# Patient Record
Sex: Male | Born: 1935
Health system: Southern US, Community
[De-identification: ages and names within clinical notes are randomized; demographics above are authoritative.]

## PROBLEM LIST (undated history)

## (undated) DIAGNOSIS — N400 Enlarged prostate without lower urinary tract symptoms: Secondary | ICD-10-CM

## (undated) DIAGNOSIS — E785 Hyperlipidemia, unspecified: Secondary | ICD-10-CM

## (undated) DIAGNOSIS — Z8679 Personal history of other diseases of the circulatory system: Secondary | ICD-10-CM

## (undated) DIAGNOSIS — J329 Chronic sinusitis, unspecified: Secondary | ICD-10-CM

## (undated) DIAGNOSIS — K219 Gastro-esophageal reflux disease without esophagitis: Secondary | ICD-10-CM

## (undated) DIAGNOSIS — K222 Esophageal obstruction: Secondary | ICD-10-CM

## (undated) DIAGNOSIS — Z950 Presence of cardiac pacemaker: Secondary | ICD-10-CM

## (undated) DIAGNOSIS — R55 Syncope and collapse: Secondary | ICD-10-CM

## (undated) DIAGNOSIS — I4430 Unspecified atrioventricular block: Secondary | ICD-10-CM

## (undated) HISTORY — DX: Unspecified atrioventricular block: I44.30

## (undated) HISTORY — DX: Esophageal obstruction: K22.2

## (undated) HISTORY — PX: PACEMAKER INSERTION: SHX728

## (undated) HISTORY — DX: Hyperlipidemia, unspecified: E78.5

## (undated) HISTORY — PX: CHOLECYSTECTOMY: SHX55

## (undated) HISTORY — DX: Chronic sinusitis, unspecified: J32.9

## (undated) HISTORY — DX: Syncope and collapse: R55

## (undated) HISTORY — DX: Personal history of other diseases of the circulatory system: Z86.79

## (undated) HISTORY — DX: Gastro-esophageal reflux disease without esophagitis: K21.9

## (undated) HISTORY — DX: Benign prostatic hyperplasia without lower urinary tract symptoms: N40.0

---

## 1998-07-09 ENCOUNTER — Encounter: Payer: Self-pay | Admitting: Gastroenterology

## 1998-07-09 ENCOUNTER — Ambulatory Visit (HOSPITAL_COMMUNITY): Admission: RE | Admit: 1998-07-09 | Discharge: 1998-07-09 | Payer: Self-pay | Admitting: Gastroenterology

## 1998-10-17 ENCOUNTER — Encounter: Payer: Self-pay | Admitting: Internal Medicine

## 1998-10-17 ENCOUNTER — Ambulatory Visit: Admission: RE | Admit: 1998-10-17 | Discharge: 1998-10-17 | Payer: Self-pay | Admitting: Internal Medicine

## 1998-11-25 ENCOUNTER — Encounter: Payer: Self-pay | Admitting: Otolaryngology

## 1998-11-25 ENCOUNTER — Ambulatory Visit (HOSPITAL_COMMUNITY): Admission: RE | Admit: 1998-11-25 | Discharge: 1998-11-25 | Payer: Self-pay | Admitting: Otolaryngology

## 1998-11-30 ENCOUNTER — Encounter: Payer: Self-pay | Admitting: Otolaryngology

## 1998-12-01 ENCOUNTER — Inpatient Hospital Stay (HOSPITAL_COMMUNITY): Admission: RE | Admit: 1998-12-01 | Discharge: 1998-12-04 | Payer: Self-pay | Admitting: Neurosurgery

## 1998-12-02 ENCOUNTER — Encounter: Payer: Self-pay | Admitting: Neurosurgery

## 1998-12-03 ENCOUNTER — Encounter: Payer: Self-pay | Admitting: Neurosurgery

## 1999-06-16 ENCOUNTER — Encounter: Admission: RE | Admit: 1999-06-16 | Discharge: 1999-06-16 | Payer: Self-pay | Admitting: Neurosurgery

## 1999-06-16 ENCOUNTER — Encounter: Payer: Self-pay | Admitting: Neurosurgery

## 2000-06-23 ENCOUNTER — Encounter: Payer: Self-pay | Admitting: Neurosurgery

## 2000-06-23 ENCOUNTER — Ambulatory Visit (HOSPITAL_COMMUNITY): Admission: RE | Admit: 2000-06-23 | Discharge: 2000-06-23 | Payer: Self-pay | Admitting: Neurosurgery

## 2001-07-06 ENCOUNTER — Encounter: Admission: RE | Admit: 2001-07-06 | Discharge: 2001-07-06 | Payer: Self-pay | Admitting: Neurosurgery

## 2001-07-06 ENCOUNTER — Encounter: Payer: Self-pay | Admitting: Neurosurgery

## 2002-07-01 ENCOUNTER — Encounter: Admission: RE | Admit: 2002-07-01 | Discharge: 2002-07-01 | Payer: Self-pay | Admitting: Internal Medicine

## 2002-07-01 ENCOUNTER — Encounter (HOSPITAL_BASED_OUTPATIENT_CLINIC_OR_DEPARTMENT_OTHER): Payer: Self-pay | Admitting: Internal Medicine

## 2002-10-21 ENCOUNTER — Encounter: Admission: RE | Admit: 2002-10-21 | Discharge: 2002-10-21 | Payer: Self-pay | Admitting: Neurosurgery

## 2002-10-21 ENCOUNTER — Encounter: Payer: Self-pay | Admitting: Neurosurgery

## 2003-06-24 HISTORY — PX: CARDIAC CATHETERIZATION: SHX172

## 2003-07-04 ENCOUNTER — Emergency Department (HOSPITAL_COMMUNITY): Admission: EM | Admit: 2003-07-04 | Discharge: 2003-07-05 | Payer: Self-pay | Admitting: Emergency Medicine

## 2003-07-15 ENCOUNTER — Ambulatory Visit (HOSPITAL_COMMUNITY): Admission: RE | Admit: 2003-07-15 | Discharge: 2003-07-15 | Payer: Self-pay | Admitting: Cardiology

## 2003-10-30 ENCOUNTER — Encounter: Admission: RE | Admit: 2003-10-30 | Discharge: 2003-10-30 | Payer: Self-pay | Admitting: Neurosurgery

## 2003-12-16 ENCOUNTER — Ambulatory Visit (HOSPITAL_COMMUNITY): Admission: RE | Admit: 2003-12-16 | Discharge: 2003-12-16 | Payer: Self-pay | Admitting: Gastroenterology

## 2003-12-23 ENCOUNTER — Ambulatory Visit (HOSPITAL_COMMUNITY): Admission: RE | Admit: 2003-12-23 | Discharge: 2003-12-23 | Payer: Self-pay | Admitting: Cardiology

## 2004-10-21 ENCOUNTER — Inpatient Hospital Stay (HOSPITAL_COMMUNITY): Admission: RE | Admit: 2004-10-21 | Discharge: 2004-10-23 | Payer: Self-pay | Admitting: Urology

## 2004-10-21 ENCOUNTER — Encounter (INDEPENDENT_AMBULATORY_CARE_PROVIDER_SITE_OTHER): Payer: Self-pay | Admitting: *Deleted

## 2005-01-13 ENCOUNTER — Encounter: Admission: RE | Admit: 2005-01-13 | Discharge: 2005-01-13 | Payer: Self-pay | Admitting: Neurosurgery

## 2005-03-05 ENCOUNTER — Emergency Department (HOSPITAL_COMMUNITY): Admission: EM | Admit: 2005-03-05 | Discharge: 2005-03-06 | Payer: Self-pay | Admitting: Emergency Medicine

## 2005-07-12 ENCOUNTER — Emergency Department (HOSPITAL_COMMUNITY): Admission: EM | Admit: 2005-07-12 | Discharge: 2005-07-12 | Payer: Self-pay | Admitting: Family Medicine

## 2006-01-12 ENCOUNTER — Encounter: Admission: RE | Admit: 2006-01-12 | Discharge: 2006-01-12 | Payer: Self-pay | Admitting: Neurosurgery

## 2007-01-10 ENCOUNTER — Encounter: Admission: RE | Admit: 2007-01-10 | Discharge: 2007-01-10 | Payer: Self-pay | Admitting: Neurosurgery

## 2007-11-07 ENCOUNTER — Inpatient Hospital Stay (HOSPITAL_COMMUNITY): Admission: EM | Admit: 2007-11-07 | Discharge: 2007-11-09 | Payer: Self-pay | Admitting: Emergency Medicine

## 2007-11-08 ENCOUNTER — Encounter (INDEPENDENT_AMBULATORY_CARE_PROVIDER_SITE_OTHER): Payer: Self-pay | Admitting: General Surgery

## 2009-08-26 DIAGNOSIS — R195 Other fecal abnormalities: Secondary | ICD-10-CM | POA: Insufficient documentation

## 2009-08-26 DIAGNOSIS — R972 Elevated prostate specific antigen [PSA]: Secondary | ICD-10-CM | POA: Insufficient documentation

## 2009-10-28 ENCOUNTER — Encounter: Admission: RE | Admit: 2009-10-28 | Discharge: 2009-10-28 | Payer: Self-pay | Admitting: Neurosurgery

## 2009-12-28 ENCOUNTER — Encounter: Admission: RE | Admit: 2009-12-28 | Discharge: 2009-12-28 | Payer: Self-pay | Admitting: Internal Medicine

## 2010-01-15 ENCOUNTER — Ambulatory Visit: Payer: Self-pay | Admitting: Cardiology

## 2010-03-27 ENCOUNTER — Encounter: Payer: Self-pay | Admitting: Internal Medicine

## 2010-03-31 ENCOUNTER — Telehealth (INDEPENDENT_AMBULATORY_CARE_PROVIDER_SITE_OTHER): Payer: Self-pay | Admitting: *Deleted

## 2010-04-12 ENCOUNTER — Ambulatory Visit: Payer: Self-pay

## 2010-04-12 ENCOUNTER — Encounter: Payer: Self-pay | Admitting: Internal Medicine

## 2010-04-29 ENCOUNTER — Ambulatory Visit: Payer: Self-pay | Admitting: Cardiology

## 2010-06-13 ENCOUNTER — Encounter: Payer: Self-pay | Admitting: Neurosurgery

## 2010-06-24 NOTE — Cardiovascular Report (Signed)
Summary: Office Visit   Office Visit   Imported By: Roderic Ovens 04/27/2010 11:00:13  _____________________________________________________________________  External Attachment:    Type:   Image     Comment:   External Document

## 2010-06-24 NOTE — Progress Notes (Signed)
  Phone Note Call from Patient   Summary of Call: pt requested Dr Johney Frame follow device. Vella Kohler  March 31, 2010 5:31 PM

## 2010-06-24 NOTE — Procedures (Signed)
Summary: pacer check/medtronic   Current Medications (verified): 1)  Protonix 40 Mg Tbec (Pantoprazole Sodium) .... Take 1 Capsule By Mouth Once Daily 2)  Simvastatin 40 Mg Tabs (Simvastatin) .... Take 1 Tablet By Mouth Once Daily 3)  Flunisolide 0.025 % Soln (Flunisolide) .... One or Two Sprays Daily 4)  L-Lysine 500 Mg Tabs (Lysine) .... Take 1 Tablet By Mouth Once Daily 5)  Multivitamins  Tabs (Multiple Vitamin) .... Take 1 Tablet By Mouth Once Daily  Allergies (verified): 1)  ! Aspirin 2)  ! Erythromycin  PPM Specifications Following MD:  Hillis Range, MD     Referring MD:  Rolla Plate PPM Vendor:  Medtronic     PPM Model Number:  [id number]     PPM Serial Number:  [id number] H PPM DOI:  12/23/2003     PPM Implanting MD:  Rolla Plate  Lead 1    Location: RA     DOI: 12/02/1998     Model #: [id number]     Serial #: [id number]     Status: active Lead 2    Location: RV     DOI: 12/02/1998     Model #: [id number]     Serial #: [id number]     Status: active  Magnet Response Rate:  BOL 85 ERI 65  Indications:  Sick sinus syndrome   PPM Follow Up Battery Voltage:  2.73 V     Battery Est. Longevity:  2.5 yrs     Pacer Dependent:  Yes       PPM Device Measurements Atrium  Amplitude: 5.60 mV, Impedance: 430 ohms, Threshold: 0.750 V at 0.40 msec Right Ventricle  Amplitude: PACED mV, Impedance: 475 ohms, Threshold: 0.750 V at 0.40 msec  Episodes MS Episodes:  0     Ventricular High Rate:  0     Atrial Pacing:  11.8%     Ventricular Pacing:  99.9%  Parameters Mode:  DDDR     Lower Rate Limit:  40     Upper Rate Limit:  130 Paced AV Delay:  200     Sensed AV Delay:  180 Next Cardiology Appt Due:  06/23/2010 Tech Comments:  [place] [facility] PT---NORMAL DEVICE FUNCTION.  NO EPISODES SINCE LAST CHECK.  NO CHANGES MADE. PT ENROLLED IN [place].  ROV IN 3 [facility] W/JA AND WILL CONTINUE Javier Glazier AFTER APPT W/JA. Vella Kohler  April 12, 2010 2:33 PM

## 2010-06-24 NOTE — Miscellaneous (Signed)
Summary: Device preload  Clinical Lists Changes  Observations: Added new observation of PPM INDICATN: Sick sinus syndrome (03/27/2010 13:35) Added new observation of MAGNET RTE: BOL 85 ERI 65 (03/27/2010 13:35) Added new observation of PPMLEADSTAT2: active (03/27/2010 13:35) Added new observation of PPMLEADSER2: 20312  (03/27/2010 13:35) Added new observation of PPMLEADMOD2: 4461  (03/27/2010 13:35) Added new observation of PPMLEADDOI2: 12/02/1998  (03/27/2010 13:35) Added new observation of PPMLEADLOC2: RV  (03/27/2010 13:35) Added new observation of PPMLEADSTAT1: active  (03/27/2010 13:35) Added new observation of PPMLEADSER1: 742595  (03/27/2010 13:35) Added new observation of PPMLEADMOD1: 4460  (03/27/2010 13:35) Added new observation of PPMLEADDOI1: 12/02/1998  (03/27/2010 13:35) Added new observation of PPMLEADLOC1: RA  (03/27/2010 13:35) Added new observation of PPM DOI: 12/23/2003  (03/27/2010 13:35) Added new observation of PPM SERL#: [id number]  (03/27/2010 13:35) Added new observation of PPM MODL#: [id number]  (03/27/2010 41:66) Added new observation of PACEMAKERMFG: Medtronic  (03/27/2010 13:35) Added new observation of PPM IMP MD: Duffy Rhody Tennant,MD  (03/27/2010 13:35) Added new observation of PPM REFER MD: Rolla Plate  (03/27/2010 13:35) Added new observation of PACEMAKER MD: Sherryl Manges, MD  (03/27/2010 13:35)      PPM Specifications Following MD:  Sherryl Manges, MD     Referring MD:  Rolla Plate PPM Vendor:  Medtronic     PPM Model Number:  [id number]     PPM Serial Number:  [id number] H PPM DOI:  12/23/2003     PPM Implanting MD:  Rolla Plate  Lead 1    Location: RA     DOI: 12/02/1998     Model #: [id number]     Serial #: [id number]     Status: active Lead 2    Location: RV     DOI: 12/02/1998     Model #: [id number]     Serial #: [id number]     Status: active  Magnet Response Rate:  BOL 85 ERI 65  Indications:  Sick sinus syndrome

## 2010-07-14 DIAGNOSIS — E785 Hyperlipidemia, unspecified: Secondary | ICD-10-CM | POA: Insufficient documentation

## 2010-07-14 DIAGNOSIS — I442 Atrioventricular block, complete: Secondary | ICD-10-CM | POA: Insufficient documentation

## 2010-07-14 DIAGNOSIS — K219 Gastro-esophageal reflux disease without esophagitis: Secondary | ICD-10-CM | POA: Insufficient documentation

## 2010-07-14 DIAGNOSIS — Z950 Presence of cardiac pacemaker: Secondary | ICD-10-CM | POA: Insufficient documentation

## 2010-07-15 ENCOUNTER — Encounter: Payer: Self-pay | Admitting: Internal Medicine

## 2010-07-15 ENCOUNTER — Encounter (INDEPENDENT_AMBULATORY_CARE_PROVIDER_SITE_OTHER): Payer: Medicare Other | Admitting: Internal Medicine

## 2010-07-15 DIAGNOSIS — I10 Essential (primary) hypertension: Secondary | ICD-10-CM | POA: Insufficient documentation

## 2010-07-15 DIAGNOSIS — I442 Atrioventricular block, complete: Secondary | ICD-10-CM

## 2010-07-20 NOTE — Assessment & Plan Note (Signed)
Summary: device/saf/RS from/PC2/pmo   Visit Type:  PPM-Medtronic Referring Provider:  Dr Patty Sermons Primary Provider:  Dr Brunilda Payor  CC:  no complaints.  History of Present Illness: Charles Olson is a pleaant 77 yo male with a h/o complete heart block s/p PPM who presents to establish care in the EP clinic.  He reports having syncope in the setting of CHB and underwent initial pacemaker implantation 12/02/1998.  Most recently, he underwent PPM pulse generator replacement by Dr Reyes Ivan 12/23/03.  He has done very well since that time.  He remains very active.  He denies symptoms of palpitations, chest pain, shortness of breath, orthopnea, PND, lower extremity edema, dizziness, presyncope, syncope, or neurologic sequela. The patient is tolerating medications without difficulties and is otherwise without complaint today.   Current Medications (verified): 1)  Protonix 40 Mg Tbec (Pantoprazole Sodium) .... Take 1 Capsule By Mouth Once Daily 2)  Simvastatin 40 Mg Tabs (Simvastatin) .... Take 1 Tablet By Mouth Once Daily 3)  Flunisolide 0.025 % Soln (Flunisolide) .... One or Two Sprays Daily 4)  L-Lysine 500 Mg Tabs (Lysine) .... Take 1 Tablet By Mouth Once Daily 5)  Multivitamins  Tabs (Multiple Vitamin) .... Take 1 Tablet By Mouth Once Daily  Allergies (verified): 1)  ! Aspirin 2)  ! Erythromycin  Past History:  Past Medical History: Complete Heart block s/p PPM 2000 with generator replacement 2005 by Dr Lujean Amel (ICD-272.4) GERD (ICD-530.81) Hiatal hernia  Past Surgical History: s/p PPM 2000, with generator change 2005 by Dr Reyes Ivan s/p APPY s/p chole tonsellectomy hearnia repair x 2 nasal polyps removed  Family History: Reviewed history from 07/14/2010 and no changes required.  Mother died of heart disease at 33.  Father had colon   cancer.  He has had a twin brother who died of a heart attack.  Social History:  He is married, has three sons, has been active in   Holiday [employer].  He is retired and lives in [place].  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      Review of Systems       All systems are reviewed and negative except as listed in the HPI.   Vital Signs:  Patient profile:   77 year old male Height:      70 inches Weight:      157.50 pounds BMI:     22.68 Pulse rate:   59 / minute BP sitting:   155 / 69  (left arm) Cuff size:   regular  Vitals Entered By: Caralee Ates CMA (July 15, 2010 9:54 AM)  Physical Exam  General:  Well developed, well nourished, in no acute distress. Head:  normocephalic and atraumatic Eyes:  PERRLA/EOM intact; conjunctiva and lids normal. Mouth:  Teeth, gums and palate normal. Oral mucosa normal. Neck:  supple Chest Wall:  pacemaker pocket is well healed Lungs:  Clear bilaterally to auscultation and percussion. Heart:  RRR, no m/r/g Abdomen:  Bowel sounds positive; abdomen soft and non-tender without masses, organomegaly, or hernias noted. No hepatosplenomegaly. Msk:  Back normal, normal gait. Muscle strength and tone normal. Extremities:  No clubbing or cyanosis. Neurologic:  Alert and oriented x 3. Skin:  Intact without lesions or rashes.   PPM Specifications Following MD:  Charles Range, MD     Referring MD:  Rolla Plate PPM Vendor:  Medtronic     PPM Model Number:  [id number]  PPM Serial Number:  [id number] H PPM DOI:  12/23/2003     PPM Implanting MD:  Duffy Rhody Tennant,MD  Lead 1    Location: RA     DOI: 12/02/1998     Model #: [id number]     Serial #: [id number]     Status: active Lead 2    Location: RV     DOI: 12/02/1998     Model #: [id number]     Serial #: [id number]     Status: active  Magnet Response Rate:  BOL 85 ERI 65  Indications:  Sick sinus syndrome   PPM Follow Up Pacer Dependent:  Yes      Parameters Mode:  DDDR     Lower Rate Limit:  40     Upper Rate Limit:  130 Paced AV Delay:  200     Sensed AV Delay:  180 MD  Comments:  see scanned report  Impression & Recommendations:  Problem # 1:  ATRIOVENTRICULAR BLOCK (ICD-426.10) s/p PPM for complete heart block normal pacemaker function see scanned report  Problem # 2:  HYPERLIPIDEMIA (ICD-272.4) stable no changes His updated medication list for this problem includes:    Simvastatin 40 Mg Tabs (Simvastatin) .Marland Kitchen... Take 1 tablet by mouth once daily  Problem # 3:  ESSENTIAL HYPERTENSION, BENIGN (ICD-401.1) above goal today salt restriction advised  Patient Instructions: 1)  Your physician recommends that you schedule a follow-up appointment in: 1YEAR 2)  Your physician recommends that you continue on your current medications as directed. Please refer to the Current Medication list given to you today. 3)  Your physician has requested that you limit the intake of sodium (salt) in your diet to four grams daily. Please see [facility] handout.

## 2010-08-03 NOTE — Cardiovascular Report (Signed)
Summary: Office Visit   Office Visit   Imported By: Roderic Ovens 07/28/2010 12:40:15  _____________________________________________________________________  External Attachment:    Type:   Image     Comment:   External Document

## 2010-10-05 NOTE — Op Note (Signed)
NAMETYRECE, VANTERPOOL                ACCOUNT NO.:  [id number]   MEDICAL RECORD NO.:  [id number]          PATIENT TYPE:  INP   LOCATION:  4742                         FACILITY:  [facility]   PHYSICIAN:  Sharlet Salina T. Hoxworth, M.D.DATE OF BIRTH:  02-29-1930   DATE OF PROCEDURE:  11/08/2007  DATE OF DISCHARGE:                               OPERATIVE REPORT   PREOPERATIVE DIAGNOSIS:  Acute cholecystitis and cholelithiasis.   POSTOPERATIVE DIAGNOSIS:  Acute cholecystitis and cholelithiasis.   SURGICAL PROCEDURES:  Laparoscopic cholecystectomy with intraoperative  cholangiogram.   SURGEON:  Sharlet Salina T. Hoxworth, MD   ANESTHESIA:  General.   BRIEF HISTORY:  Shante Maysonet is a 77 year old male who presents with  acute onset of epigastric abdominal pain, nausea, vomiting.  Workup has  included a CT scan showing a large gallstone impacted the neck of the  gallbladder and HIDA scan was positive for nonfilling of the  gallbladder.  I have recommended proceeding with laparoscopic  cholecystectomy with cholangiogram for apparent acute cholecystitis with  cholelithiasis.  Ashby Dawes of the procedure, indications, risks of  bleeding, infection, bile leak, bile duct injury and anesthetic risks  were discussed with the patient and the family preoperatively.  He is  now brought to operating room for this procedure.   DESCRIPTION OF OPERATION:  The patient was brought to the operating room  and placed in the supine position on the operating table, and general  orotracheal anesthesia was induced.  The abdomen was widely sterilely  prepped and draped.  Correct patient and procedure were verified.  He is  already on broad-spectrum antibiotics.  Local anesthesia was used to  infiltrate the trocar sites.  Access was obtained with a 1-cm incision  at the umbilicus using a Hasson trocar and pneumoperitoneum was  established.  There were a few chronic omental adhesions in the right  upper quadrant.  They were taken  down for exposure.  The gallbladder was  exposed and was acutely inflamed with patchy areas of gangrene and was  tensely distended.  It was decompressed with an aspiration needle and  the fundus was grasped and elevated above the liver.  The infundibulum  retracted inferolaterally.  The dissection of the distal gallbladder and  closed triangle was relatively easy with edematous changes.  Fibrofatty  tissue was stripped off the neck of the gallbladder and closed triangle  was thoroughly dissected.  The cystic duct gallbladder junction was  dissected 306 degrees then the cystic duct was dissected out over a  centimeter or so.  When the anatomy appeared clear, the cystic duct was  clipped at the gallbladder junction and an operative cholangiogram was  obtained through the cystic duct.  This showed good filling of normal  common bile duct and intrahepatic ducts with free flow into the duodenum  and no filling defects.  Following this, the cystic artery was triply  clipped proximally after removal of the cholangiocath and divided.  The  anterior and posterior branches of the cystic artery were clearly seen  coursing up out of the gallbladder wall, were divided between clips.  The gallbladder was  then dissected free from its bed using hook cautery  and removed through an EndoCatch bag.  Complete hemostasis was obtained  in the gallbladder bed  and the right upper quadrant was thoroughly irrigated and suctioned  until clear.  Trocars were then removed under direct vision, all CO2  evacuated.  The mattress sutures were secured to the umbilicus.  Skin  incisions were closed with subcuticular Monocryl and Dermabond.  The  patient was taken to recovery in good condition.      Lorne Skeens. Hoxworth, M.D.  Electronically Signed     BTH/MEDQ  D:  11/08/2007  T:  11/09/2007  Job:  [id number]

## 2010-10-05 NOTE — Consult Note (Signed)
Charles Olson, Charles Olson                ACCOUNT NO.:  [id number]   MEDICAL RECORD NO.:  [id number]          PATIENT TYPE:  INP   LOCATION:  4742                         FACILITY:  [facility]   PHYSICIAN:  Sharlet Salina T. Hoxworth, M.D.DATE OF BIRTH:  [birth year]   DATE OF CONSULTATION:  11/07/2007  DATE OF DISCHARGE:                                 CONSULTATION   CHIEF COMPLAINT:  Abdominal pain.   HISTORY OF PRESENT ILLNESS:  We were asked by Dr. Randa Evens to evaluate  Mr. Rijos.  He is a very nice 77 year old male who is followed somewhat  regularly by Dr. Sherin Quarry for reflux and history of esophageal  stricture.  He presented to the emergency room last night.  He had the  onset of epigastric pain in the afternoon yesterday.  The onset was  quite sudden with severe pressure-like pain.  There was no radiation.  He developed nausea and vomiting rapidly after this.  He then presented  to the emergency room for evaluation.  The pain has continued since his  admission relieved only by the pain and nausea medications.  He denies  any history of similar episodes.  He does have some occasional heartburn  and this was managed well by medications.  No fever, chills, or  jaundice.  No urinary symptoms.   PAST MEDICAL HISTORY:  Previous surgery includes bilateral inguinal  hernia repair, appendectomy, tonsillectomy, TURP, nasal polyps.  Medically, he is followed for GERD occurred and hiatal hernia with  history of esophageal stricture dilatations.  Also, he has had AV block  and had a pacemaker inserted and followed by Dr. Patty Sermons.  Also, he  has elevated cholesterol.   MEDICATIONS ON ADMISSION:  1. Protonix 40 mg daily.  2. Simvastatin 40 daily.  3. Lamisil 2.5% drops.  4. L-lysine 500 mg daily.  5. Flunisolide nasal spray daily.   ALLERGIES:  ERYTHROMYCIN BASE and ASPIRIN.   SOCIAL HISTORY:  He is married and retired.  Does not smoke cigarettes  or drink alcohol.   FAMILY HISTORY:   Noncontributory.   REVIEW OF SYSTEMS:  GENERAL:  No fever, chills, and weight change.  HEENT:  No vision, hearing, and swallowing problems.  RESPIRATORY:  No  shortness breath, cough, and wheezing.  CARDIAC:  No chest pain,  palpitations, and syncope.  ABDOMEN:  GI as above.  GU:  Some occasional  urinary hesitancy.   PHYSICAL EXAMINATION:  VITAL SIGNS:  He is afebrile, heart rate 78,  respirations 20, blood pressure 142/78, and O2 sats 98% on 2 L.  GENERAL:  He is well-developed, alert, no acute distress, after pain  medications.  SKIN:  Warm and dry.  No rash infection.  HEENT:  No palpable masses.  No scleral icterus.  LYMPH NODES:  There is no cervical, supraclavicular, or inguinal nodes  palpable.  LUNGS:  Clear without wheezing or increased work of breathing.  CARDIAC:  Regular rate and rhythm.  No murmurs.  No edema.  ABDOMEN:  There is localized epigastric tenderness and guarding without  palpable masses or hepatosplenomegaly.  Remainder the abdomen is soft,  nontender,  and nondistended.  No hernias.  EXTREMITIES:  No joint swelling deformity.  NEUROLOGIC:  Alert and oriented.  Motor and sensory exam is grossly  normal.   LABORATORY DATA:  Amylase 121.  Electrolytes normal.  Glucose 149.  LFTs  all within normal limits.  CBC shows a white count of 11,100, hemoglobin  14, and platelets 153,000.  Fecal occult blood negative.  Lactic acid  normal at 1.0.  Urinalysis unremarkable.   Imaging, acute abdomen showed a stable tiny opacity in the left lung  without acute findings.  CT scan of the abdomen and pelvis has revealed  a large gallstone in the gallbladder neck with mild prominence of the  gallbladder, but no evidence of acute cholecystitis or ductal  dilatation.  There is a nonobstructive right renal calculus as well.   Hepatobiliary scan was performed today which shows obstruction of the  cystic duct consistent with acute cholecystitis.   ASSESSMENT/PLAN:  Acute  abdominal pain and tenderness consistent with  acute cholecystitis and the imaging demonstrates a large gallstone  obstruction of the cystic duct.  He appears to have early acute  cholecystitis.  I have recommended proceeding with laparoscopic  cholecystectomy with intraoperative cholangiogram.  The procedures,  indications, and risks were discussed with the patient and family and we  will schedule this for early tomorrow morning.      Lorne Skeens. Hoxworth, M.D.  Electronically Signed     BTH/MEDQ  D:  11/07/2007  T:  11/08/2007  Job:  [id number]

## 2010-10-05 NOTE — H&P (Signed)
Charles Olson, Charles Olson                ACCOUNT NO.:  [id number]   MEDICAL RECORD NO.:  [id number]          PATIENT TYPE:  INP   LOCATION:  4742                         FACILITY:  [facility]   PHYSICIAN:  Charles Olson., M.D.DATE OF BIRTH:  [birth year]   DATE OF ADMISSION:  11/06/2007  DATE OF DISCHARGE:                              HISTORY & PHYSICAL   REASON FOR ADMISSION:  Severe upper abdominal pain.   HISTORY:  Nice patient of Dr. Tasia Olson who has obtained his care  through the [facility], is now up again and see Dr. Dossie Olson for primary  care.  He presented to the emergency room with severe abdominal pain.  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  Upon presenting to triage, there was concern that he may well be having  a ruptured aneurysm.  He was rushed to the CT scanner where a CT scan  was performed.  It revealed a large gallstone with a distended  gallbladder, but was completely normal otherwise.  The patient's EKG was  normal in a paced rhythm.  His labs were remarkable for normal white  count and hemoglobin, normal LFTs, and normal lipase.  His urinalysis  was normal as well.  He received some Phenergan and Charles Olson and  apparently felt some relief with that.  This is the medicine which worn  off.  His pain is gradually returning.  The patient has never had  similar pain.  This was amongst the worst pain he has ever had.  He had  some nausea and vomiting with this initially at home, none since he has  come to the emergency room.   MEDICATIONS ON ADMISSION:  1. Zocor 40 mg daily.  2. Protonix 40 mg daily.  3. Flunisolide 0.25% spray in the nose daily.   He is  allergic to ASPIRIN and ERYTHROMYCIN.   MEDICAL HISTORY:  History of elevated cholesterol.  He has had nasal  polyps for which he has nasal sprays which he has been obtaining from  the [place].  He has had a history of kidney stones.  He has esophageal  reflux with strictures and had dilations 3-4 times by Dr. Sherin Olson, also  his routine colonoscopies due to family history of colon cancer.  All  these have been up today.  He has an AV block for which he sees Dr. Ronny Olson and has had a pacemaker in one of a malfunction, 3 called and  he said a second one place.  He has had a Persantine type stress test in  the past that has been normal.   Surgeries include appendectomy, TURP, pacemaker insertion,  tonsillectomy, and inguinal hernia repair.   FAMILY HISTORY:  Mother died of heart disease at 81.  Father  had colon  cancer.  He has had a twin brother who died of a heart attack.   SOCIAL HISTORY:  He is married, has three sons, has been active in  [employer].  He is retired.  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   REVIEW OF SYSTEMS:  No problems swallowing since his last dilation.  He  has no dyspepsia, all on Protonix which he takes regularly, never has  had a gallbladder attack in the past.  His bowels are normal.  He has  never had exertional chest pain.  He has had normal studies in the past.  He sees Dr. Patty Olson every 3-4 months and has his pacemaker checked.  He saw him a week or two ago.  His pacemaker was functioning fine.   PHYSICAL EXAMINATION:  VITAL SIGNS:  Temperature 97.8, pulse 66, and  blood pressure 147/67.  GENERAL:  Alert and oriented white male who is complaining of some  epigastric pain.  He is in no distress.  EYES:  Sclerae nonicteric.  HEART:  Regular rate and rhythm without murmurs or gallops.  LUNGS:  Clear.  ABDOMEN:  Soft and nondistended with localized epigastric tenderness.   EXTREMITIES:  Good pulses.  No peripheral edema.   ASSESSMENT:  Marked epigastric pain - most likely etiology in this  gentleman is gallstones.  This is the only finding on CT.  His labs are  otherwise normal.  EKG is normal.   PLAN:  We will go ahead and admit for pain control, obtain hepatobiliary  scan.  If this is negative, he may need an endoscopy.  As a precaution,  we will place him on telemetry.           ______________________________  Charles Olson., M.D.     Charles Olson  D:  11/07/2007  T:  11/07/2007  Job:  [id number]   cc:   Charles Olson, M.D.  Charles Olson, M.D.  Charles Olson, M.D.

## 2010-10-08 NOTE — Op Note (Signed)
[name], Charles Olson                ACCOUNT NO.:  [id number]   MEDICAL RECORD NO.:  [id number]          PATIENT TYPE:  AMB   LOCATION:  DAY                          FACILITY:  [facility]   PHYSICIAN:  Excell Seltzer. Annabell Howells, M.D.    DATE OF BIRTH:  [birth year]   DATE OF PROCEDURE:  10/21/2004  DATE OF DISCHARGE:                                 OPERATIVE REPORT   PREOPERATIVE DIAGNOSIS:  Benign prostatic hypertrophy.   POSTOPERATIVE DIAGNOSIS:  Benign prostatic hypertrophy.   PROCEDURE:  CystoTURP.   ATTENDING SURGEON:  Bjorn Pippin, MD   RESIDENT SURGEON:  Rhae Lerner, MD   ANESTHESIA:  General endotracheal anesthesia.   COMPLICATIONS:  None.   INDICATIONS FOR PROCEDURE:  Mr. Ciullo is a 77 year old gentleman with a past  medical history of significant lower urinary tract symptoms including  dysuria, frequency, and nocturia who underwent evaluation for hematuria.  On  evaluation, the patient was noted to have several small bladder stones as  well as a large median lobe and coapting lateral prostate lobes.  After  discussing his condition with the patient, he has elected to proceed with  cystoscopy and removal of the bladder stones as well as TURP.  Prior to the  procedure today, Mr. Pinney was able to pass his bladder stones, and therefore  we will plan to inspect the bladder for any remaining stones and remove them  prior to performing the TURP.   PROCEDURE IN DETAIL:  The patient was brought to the operating room.  Following induction of general endotracheal anesthesia, was placed in the  dorsal lithotomy position and prepped and draped in the usual sterile  fashion.  A rigid cystoscope was subsequently advanced through the patient's  urethra and into the bladder.  Upon entering the bladder, both right and  left ureteral orifices were identified and efflux of clear urine confirmed  from each.  Systematic examination of the bladder subsequently revealed mild  trabeculation throughout;  however, there no signs of tumor, stone, trauma,  or other abnormality present.  The cystoscope was subsequent removed, and a  28-French resectoscope was advanced through the patient's urethra and into  the bladder.  The resectoscope was then withdrawn into the prostatic urethra  and transurethral resection of the prostate initiated using 24 resecting  loop.  After resecting both the median and lateral lobes and obtaining  hemostasis using the cautery function of the resecting loop, the working  element was removed and an Ellik was used to irrigate out all prostate  chips.  Final inspection of the prostatic urethra was performed to confirm  reasonable hemostasis.  The resectoscope was subsequent removed, and a 22  French three-way Foley catheter was placed to straight drain.  CBI was  hooked up to the Foley catheter and initiated at a moderate drip.  Foley  catheter was also placed on gentle traction via a overlying  strap.  The patient was subsequently allowed to awaken, and the case was  ended.  The patient tolerated the procedure well, and there no  complications.  All prostate chips were sent for  pathologic evaluation.  Please note that Dr. Bjorn Pippin was present for the entire case and  participated in all aspects of the procedure.       JJP/MEDQ  D:  10/21/2004  T:  10/22/2004  Job:  [id number]

## 2010-10-08 NOTE — Op Note (Signed)
Charles Olson, Charles Olson                ACCOUNT NO.:  [id number]   MEDICAL RECORD NO.:  [id number]          PATIENT TYPE:  INP   LOCATION:  1405                         FACILITY:  [facility]   PHYSICIAN:  Excell Seltzer. Annabell Howells, M.D.    DATE OF BIRTH:  11-Nov-1929   DATE OF PROCEDURE:  10/21/2004  DATE OF DISCHARGE:  10/23/2004                                 OPERATIVE REPORT   PROCEDURE:  TURP and removal of bladder stone.   PREOPERATIVE DIAGNOSES:  Benign prostatic hypertrophy with bladder outlet  obstruction and bladder stone.   POSTOPERATIVE DIAGNOSES:  Benign prostatic hypertrophy with bladder outlet  obstruction and bladder stone.   SURGEON:  Excell Seltzer. Annabell Howells, M.D.   ANESTHESIA:  General.   DRAINS:  22 French three way Foley catheter.   SPECIMENS:  Stone and prostate chips.   COMPLICATIONS:  None.   INDICATIONS FOR PROCEDURE:  Mr. Baisley is a 77 year old white male with  bladder outlet obstruction and bladder stones who is to undergo TURP and  removal of bladder stone.   DESCRIPTION OF PROCEDURE:  The patient was taken to the operating room after  receiving p.o. Cipro, a general anesthetic was induced. He was placed in  lithotomy position, his perineum and genitalia were prepped with Betadine  solution, he was draped in the usual sterile fashion. Cystoscopy was  performed using the 22 [place] scope and 12 and 70 degree lenses. Examination  revealed a normal urethra. The external sphincter was intact. The prostatic  urethra was elongated with trilobar hyperplasia. There was a high bladder  neck. Examination of the bladder revealed moderate trabeculation, the stone  previously noted was not well visualized and he did report passing three  small stones in his urine in the last couple of weeks. His ureteral orifices  were unremarkable.   After completion of cystoscopy, the urethra was calibrated to 30 [place] with  [employer] and a 28 French continuous flow resectoscope sheath was  inserted, this was fitted with an Wandra Scot handle with a 12 degree lens and  26 loop. The patient then underwent resection of the prostate, the bladder  neck fibers were excised from 5 to 7 o'clock, the floor of the prostate was  then resected out to and along side the verumontanum. The right lobe of the  prostate was resected from bladder neck to apex followed by the left lobe.  The bladder was evacuated free of chips. During this evacuation, a 1 cm  bladder stone was aspirated out in addition to the chips. At this point,  some additional anterior and apical tissue was resected. These chips were  removed, the prostatic fossa was examined and hemostasis was achieved. Final  inspection revealed no retained chips. The ureteral orifices were well way  from the resection margin. The prostatic urethra was widely patent. Pressure  on the bladder after removal of the scope produced a good stream. A 22  [place] three way Foley catheter was then placed without difficulty with the  aid of a catheter guide. The balloon was filled with 30 mL of sterile fluid.  The catheter was held on traction and hand  irrigated until clear. It was then connected to continuous irrigation and  straight drainage. The patient was then taken down from lithotomy position,  his anesthetic was reversed, he was moved to the recovery room in stable  condition and there were no complications.       JJW/MEDQ  D:  10/27/2004  T:  10/27/2004  Job:  [id number]

## 2010-10-08 NOTE — H&P (Signed)
NAME:  JENNINGS, CORADO                          ACCOUNT NO.:  [id number]   MEDICAL RECORD NO.:  [id number]                   PATIENT TYPE:  AMB   LOCATION:  ENDO                                 FACILITY:  [facility]   PHYSICIAN:  Colleen Can. Deborah Chalk, M.D.            DATE OF BIRTH:  Aug 15, 1929   DATE OF ADMISSION:  12/23/2003  DATE OF DISCHARGE:                                HISTORY & PHYSICAL   CHIEF COMPLAINT:  None.   HISTORY OF PRESENT ILLNESS:  Mr. Dottavio is a pleasant 77 year old white male  who presents for elective generator replacement.  He has had a dual-chamber  pacemaker, Guidant dual-chamber unit that was implanted in July 2000 for  syncope that was secondary to high-grade AV block with marked bradycardia.  Unfortunately, his device has the potential for unanticipated device  behavior and subsequently has been recalled.  He now presents for elective  generator replacement.   Clinically he has done well.  He has had no cardiac complaints.   PAST MEDICAL HISTORY:  1. Chest pain.  He is status post cardiac catheterization in 21-Jul-2003    with essentially normal coronaries and normal LV function.  2. History of syncope with high-grade AV block, marked bradycardia, with     original pacemaker implantation July 2000 with a Guidant unit.  3. History of orthostatic hypotension.  4. History of sinusitis.  5. Gastroesophageal reflux disease.  6. Hyperlipidemia.  7. Reported small tumor of the brain, reportedly benign.   ALLERGIES:  ASPIRIN.   Current medicines include:  1. Flunisolide at bedtime p.r.n.  2. Protonix 40 mg a day.  3. Zocor 40 mg a day.   FAMILY HISTORY:  He has had a twin brother that died in 28-Feb-2005with  myocardial infarction.   SOCIAL HISTORY:  He is married.  He has no current alcohol or tobacco  products.   REVIEW OF SYSTEMS:  As noted above.  He has had no chest pain, shortness of  breath, or dizzy spells.  He is due for a colonoscopy that is  tentatively  set for July 26.   PHYSICAL EXAMINATION:  GENERAL:  He is a very pleasant white male who  appears younger than his stated age.  VITAL SIGNS:  Blood pressure is 140/80 sitting, 120/80 standing, weight is  161 pounds, heart rate is 60 and regular, respirations 18, he is afebrile.  SKIN:  Warm and dry.  Color is unremarkable.  CHEST:  Lungs are clear.  CARDIAC:  Heart shows a regular rhythm.  The pacemaker is in the right  pectoral region.  ABDOMEN:  Soft, positive bowel sounds, nontender.  EXTREMITIES:  Without edema.  NEUROLOGIC:  Intact.  There are no gross focal deficits.   Pertinent labs are pending.   OVERALL IMPRESSION:  1. Potential for unanticipated pacemaker behavior with need for subsequent     replacement.  2. Original pacemaker implantation  for syncope secondary to high-grade     atrioventricular block.  3. Recent cardiac catheterization with normal findings.  4. Hyperlipidemia.  5. Gastroesophageal reflux disease.   PLAN:  Will proceed on with elective pacemaker replacement.  The procedure  has been reviewed in full detail with both him and his wife, and they are  willing to proceed on Tuesday, December 23, 2003.      Sharlee Blew, N.P.                     Colleen Can. Deborah Chalk, M.D.    LC/MEDQ  D:  12/16/2003  T:  12/16/2003  Job:  [id number]   cc:   Cassell Clement, M.D.  [address]  [place]  [place] [address]  Fax: [phone]

## 2010-10-08 NOTE — Discharge Summary (Signed)
Charles Olson, Charles Olson                ACCOUNT NO.:  [id number]   MEDICAL RECORD NO.:  [id number]          PATIENT TYPE:  INP   LOCATION:  4742                         FACILITY:  [facility]   PHYSICIAN:  James L. Randa Evens, M.D. DATE OF BIRTH:  [birth year]   DATE OF ADMISSION:  11/06/2007  DATE OF DISCHARGE:  11/09/2007                               DISCHARGE SUMMARY   DISCHARGE DIAGNOSES:  1. Acute gangrenous cholecystitis now status post lap cholecystectomy.  2. Gastroesophageal reflux disease with history of esophageal      strictures.  3. History of atrioventricular block status post cardiac pacemaker      without defibrillator.  4. Hyperlipidemia.  5. Diaphragmatic hernia.   CONSULTS:  1. [facility] Dr. Glenna Fellows saw the patient on      November 07, 2007, and on November 08, 2007, performed a laparoscopic      cholecystectomy with IOC.  2. Cardiology, Dr. Cassell Clement saw the patient on November 08, 2007,      for a presurgical clearance and evaluation of the patient's      pacemaker.   RADIOLOGICAL EXAMS:  On November 07, 2007, CT scan of the abdomen and pelvis  showed a gallstone in the neck of the gallbladder.  HIDA scan done November 07, 2007, showed cystic duct obstruction.   BRIEF HISTORY AND HOSPITAL COURSE:  Charles Olson is a very pleasant 77-year-  old gentleman who was working hard at home all day on the day of November 06, 2007.  He came to the emergency room after having severe pain in his  epigastrium.  This pain became progressively worse until he was doubled  over thrashing around in pain.  There was concern in the emergency room  when he first presented that he may have a ruptured aneurysm.  He was  rushed to the CT scanner where the CT scan revealed a large gallstone in  the gallbladder neck and a distended gallbladder. His admission labs  demonstrated a normal white count and hemoglobin, as well as normal LFTs  and lipase.  The patient was admitted, placed on IV  hydration and a  hepatobiliary scan was ordered.  The hepatobiliary scan revealed cystic  duct obstruction and  that it did not show the gallbladder.  [facility] was called and promptly scheduled a laparoscopic  cholecystectomy for first thing in the morning on November 08, 2007.  The  procedure was successful and without complication however, the patient  was found to have an acute gangrenous cholecystitis rather than simply  symptomatic cholelithiasis.  The patient recovered from surgery very  well.  He was discharged to home in good condition the following day.   SIGNIFICANT DISCHARGE LABORATORY DATA:  Hemoglobin 13.0, white count  9.5, hematocrit 38.1, and platelets 122,000.   Discharge medications included  1. Protonix 40 mg once daily.  2. Simvastatin 40 mg once daily.  3. Lamisil 2.5% to be used as needed.  4. Flunisolide one spray daily.  5. L-lysine 500 mg 1 pill daily.  6. The patient was given a  prescription for Vicodin 5/325 1-2 tablets      q.4 hours to take p.r.n. for pain.   He was also given the following followup appointments on November 20, 2007,  at 3:15 p.m.  He was to see the physician extender at the [facility] office.   Followup instructions include:  1. If he developed a fever greater than 101, if he developed worsening      abdominal pain, or if he developed a pus-like drainage from his      surgical scars, please call [facility] office.  2. He was to resume his home medications.      Stephani Police, PA       ______________________________  Llana Aliment Randa Evens, M.D.    MLY/MEDQ  D:  01/01/2008  T:  01/02/2008  Job:  [id number]   cc:   Cassell Clement, M.D.  Lorne Skeens. Hoxworth, M.D.  Shirley Friar, MD  Tasia Catchings, M.D.

## 2010-10-08 NOTE — Discharge Summary (Signed)
NAMETAISHAUN, LEVELS                ACCOUNT NO.:  [id number]   MEDICAL RECORD NO.:  [id number]          PATIENT TYPE:  INP   LOCATION:  1405                         FACILITY:  [facility]   PHYSICIAN:  Excell Seltzer. Annabell Howells, M.D.    DATE OF BIRTH:  June 01, 1929   DATE OF ADMISSION:  10/21/2004  DATE OF DISCHARGE:  10/23/2004                                 DISCHARGE SUMMARY   ADMITTING DIAGNOSIS:  Benign prostatic hypertrophy.   DISCHARGE DIAGNOSIS:  Benign prostatic hypertrophy.   PROCEDURE:  Cystoscopy and TURP.   DISCHARGE MEDICATIONS:  Vicodin and Levaquin.   ACTIVITY:  The patient may resume same activity as prior to admission.   DIET:  Regular.   FOLLOW-UP:  Patient is to contact [facility] for an appointment with  Dr. Annabell Howells in 3 weeks.   HISTORY OF PRESENT ILLNESS:  Mr. Cimo is a 77 year old gentleman who was  evaluated in [facility] for hematuria which was worse with exercise  and was not related to any other lower urinary tract symptoms. The patient  subsequently underwent evaluation and was noted to have a large prostate  leading to bladder outlet obstruction. Mr. Golla now presents for TURP.   HOSPITAL COURSE:  The patient was admitted to the [facility] and taken to the operating room at which time he underwent  cystoscopy and TURP. The patient did well postoperatively and was  transferred to the post anesthesia care unit in stable condition. For a  detailed description of the operation, please state see the typed operative  note in the chart. The patient well overnight and by postoperative day #1  his urine was clear with no need for continuous bladder irrigation. The  patient was monitored for 1 day further and on postoperative day #2 his  Foley catheter was removed without difficulty. The patient was subsequently  able to void. The patient was discharged home in stable condition. He will  follow up with Dr. Annabell Howells in 3 weeks.       JP/MEDQ  D:  11/04/2004  T:  11/04/2004  Job:  [id number]

## 2010-10-08 NOTE — Cardiovascular Report (Signed)
NAMEJEFF, FRIEDEN                          ACCOUNT NO.:  [id number]   MEDICAL RECORD NO.:  [id number]                   PATIENT TYPE:  OIB   LOCATION:  2872                                 FACILITY:  [facility]   PHYSICIAN:  Colleen Can. Deborah Chalk, M.D.            DATE OF BIRTH:  30-May-1929   DATE OF PROCEDURE:  12/23/2003  DATE OF DISCHARGE:  12/23/2003                              CARDIAC CATHETERIZATION   PROCEDURE:  Pulse generator exchange with explantation of old pulse  generator, implantation of new pulse generator.   INDICATIONS:  Recall because of device failure.   SURGEONS:  Rosine Abe, M.D.  Colleen Can. Deborah Chalk, M.D.   DESCRIPTION OF PROCEDURE:  The right subclavicular area was prepped and  draped.  The old pulse generator was explanted using sharp and Bovie  dissection.   The old pulse generator was a [employer] Ser. No.  [id number]; date of implant is December 02, 1998.  The old Guidant ventricular lead  (Model 4461 52 cm lead, Ser. No. [id number]; date of implant December 02, 1998) was  retained.  The atrial lead also was a Guidant lead (Model 4460 45 cm lead,  Ser. No. [id number]).   The following chronic thresholds were recorded:  1. Ventricular threshold:  0.9 V to capture at 2.1 mA current, with a 0.5     msec pulse width.     a. Impedance:  505 ohms.     b. R-Waves:  12.1 mV.  2. Atrial threshold:  0.4 V to capture at 1.5 mA current, with a 0.5 msec     pulse width.     a. Impedance:  428 ohms.     b. P-waves:  4.5 mV.   The wound was flushed with kanamycin solution.  The leads were connected to  a [employer]onic DDDR EnPulse E2DR01 pulse generator (Ser. No. [id number]).  The  unit was sutured in place.  The wound was closed with 2-0 and subsequently 5-  0 Vicryl, and Steri-Strips were applied.   DISPOSITION:  The patient tolerated the procedure well.                                               Colleen Can. Deborah Chalk, M.D.    SNT/MEDQ  D:  12/23/2003  T:   12/24/2003  Job:  [id number]

## 2010-10-08 NOTE — Cardiovascular Report (Signed)
Charles Olson, Charles Olson                          ACCOUNT NO.:  [id number]   MEDICAL RECORD NO.:  [id number]                   PATIENT TYPE:  OIB   LOCATION:  2866                                 FACILITY:  [facility]   PHYSICIAN:  Colleen Can. Deborah Chalk, M.D.            DATE OF BIRTH:  [birth year]   DATE OF PROCEDURE:  07/15/2003  DATE OF DISCHARGE:  07/15/2003                              CARDIAC CATHETERIZATION   HISTORY:  Charles Olson is evaluated for atypical chest pain.  He has a known  right bundle branch block and has underlying permanent pacemaker.  He had a  brother who had sudden cardiac death.  The brother was a twin brother but  not identical.  He had a stress Cardiolite study performed on 07/07/2003  which showed possible reversible inferior septal ischemia with plans for  invasive study after that questionable abnormality.  His syncope that led to  his permanent pacemaker was because of a high degree AV block with marked  bradycardia.   PROCEDURE:  Left heart catheterization with selective coronary angiography,  left ventricular angiography, and Angio-Seal closure.   TYPE AND SITE OF ENTRY:  Percutaneous right femoral artery.   CATHETERS:  6-French, 4-curved Judkins' right and left coronary catheters, 6-  French pigtail ventriculographic catheter.   CONTRAST MATERIAL:  Omnipaque.   MEDICATION GIVEN DURING THE PROCEDURE:  Versed 3 mg IV.   MEDICATION GIVEN PRIOR TO PROCEDURE:  Valium 10 mg p.o.   COMMENTS:  The patient tolerated the procedure well.   HEMODYNAMIC DATA:  The aortic pressure is 161/83, LV is 159/5-14.  There is  no aortic valve gradient on pullback.   ANGIOGRAPHIC DATA:  1. LEFT MAIN CORONARY ARTERY:  The left main coronary artery is normal.  2. LEFT CIRCUMFLEX:  The left circumflex has a large obtuse marginal.  It is     normal.  3. LEFT ANTERIOR DESCENDING:  The left anterior descending has a small     proximal diagonal and a much larger second diagonal that  essentially     bifurcates with the LAD.  The left anterior descending is essentially     normal.  4. RIGHT CORONARY ARTERY:  The right coronary artery is a small dominant     vessel.  It is normal   LEFT VENTRICULAR ANGIOGRAM:  Performed in the RAO position.  Overall cardiac  size was at the upper limits of normal.  The global ejection fraction was  reasonably well preserved with an ejection fraction between 55-60%.  Regional wall motion appeared to be normal.   OVERALL IMPRESSION:  1. Essentially normal left ventricular function.  2. Normal coronary arteries.   DISCUSSION:  It is felt that Charles Olson does not have any significant coronary  artery disease at this point in time.  Colleen Can. Deborah Chalk, M.D.    SNT/MEDQ  D:  07/15/2003  T:  07/16/2003  Job:  [id number]

## 2010-10-08 NOTE — H&P (Signed)
Charles Olson, Charles Olson                          ACCOUNT NO.:  [id number]   MEDICAL RECORD NO.:  [id number]                   PATIENT TYPE:  OIB   LOCATION:                                       FACILITY:  [facility]   PHYSICIAN:  Colleen Can. Deborah Chalk, M.D.            DATE OF BIRTH:  [birth year]   DATE OF ADMISSION:  07/15/2003  DATE OF DISCHARGE:                                HISTORY & PHYSICAL   CHIEF COMPLAINT:  Chest pain.   HISTORY OF PRESENT ILLNESS:  Mr. Mantell is a pleasant 77 year old white male  who has a known history of hyperlipidemia as well as a positive family  history of coronary disease.  He had an episode of chest pain that  necessitated a visit to the emergency room 1 week ago.  This occurred within  24 hours of his twin brother's death secondary to myocardial infarction.  His Cardiolite study was performed on July 07, 2003 which showed  possible reversible inferoseptal ischemia.  He is now referred for elective  cardiac catheterization.  He has had no further chest pain.   PAST MEDICAL HISTORY:  1. Syncope secondary to high-grade A-V block with marked bradycardia, status     post dual-chamber Guidant DDD pacemaker in July of 2000.  2. History of orthostatic hypotension.  3. History of sinusitis.  4. Gastroesophageal reflux disease.  5. Hyperlipidemia.  6. Reported small tumor of the brain, reportedly benign.   ALLERGIES:  ASPIRIN.   CURRENT MEDICINES:  1. Ativan p.r.n.  2. Protonix 40 mg a day.  3. Zocor 10 mg q day.   FAMILY HISTORY:  His twin brother died 1 week ago with myocardial  infarction.   SOCIAL HISTORY:  He is married.  There is no current alcohol or tobacco.   REVIEW OF SYSTEMS:  Review of systems is basically as noted above and is  otherwise unremarkable.   PHYSICAL EXAMINATION:  GENERAL:  On exam, he is a pleasant, somewhat anxious  white male in no acute distress.  VITAL SIGNS:  Blood pressure is 152/90 sitting, 152/86 standing; heart rate  72; respirations 18; he is afebrile.  SKIN:  Skin is warm and dry.  Color is unremarkable.  LUNGS:  Lungs are clear.  HEART:  Heart shows a regular rhythm.  ABDOMEN:  Abdomen soft, positive bowel sounds, nontender.  EXTREMITIES:  Extremities are without edema.  NEUROLOGIC:  Intact.  No gross focal deficit.   LABORATORY DATA:  Pertinent labs are pending.   OVERALL IMPRESSION:  1. Previous bout of chest pain with subsequent abnormal stress Cardiolite     study.  2. Positive family history of coronary disease.  3. Hyperlipidemia.  4. Anxiety.  5. Elevated blood pressure.   PLAN:  We will proceed on with elective cardiac catheterization.  The  procedure has been reviewed in full detail and he is willing to proceed on  Tuesday, February 22,  2005Juanell Fairly C. Earl Gala, N.P.                 Colleen Can. Deborah Chalk, M.D.    LCO/MEDQ  D:  07/11/2003  T:  07/12/2003  Job:  [id number]   cc:   Cassell Clement, M.D.  [address], [address]  [place]  [place] [address]  Fax: [phone]

## 2010-10-14 ENCOUNTER — Ambulatory Visit (INDEPENDENT_AMBULATORY_CARE_PROVIDER_SITE_OTHER): Payer: Medicare Other | Admitting: *Deleted

## 2010-10-14 DIAGNOSIS — I4430 Unspecified atrioventricular block: Secondary | ICD-10-CM

## 2010-10-14 DIAGNOSIS — I498 Other specified cardiac arrhythmias: Secondary | ICD-10-CM

## 2010-10-15 ENCOUNTER — Other Ambulatory Visit: Payer: Self-pay | Admitting: Internal Medicine

## 2010-10-20 ENCOUNTER — Encounter: Payer: Self-pay | Admitting: Cardiology

## 2010-10-21 ENCOUNTER — Ambulatory Visit (INDEPENDENT_AMBULATORY_CARE_PROVIDER_SITE_OTHER): Payer: Medicare Other | Admitting: Cardiology

## 2010-10-21 ENCOUNTER — Encounter: Payer: Self-pay | Admitting: Cardiology

## 2010-10-21 DIAGNOSIS — I4430 Unspecified atrioventricular block: Secondary | ICD-10-CM

## 2010-10-21 DIAGNOSIS — I10 Essential (primary) hypertension: Secondary | ICD-10-CM

## 2010-10-21 DIAGNOSIS — E785 Hyperlipidemia, unspecified: Secondary | ICD-10-CM

## 2010-10-21 NOTE — Progress Notes (Signed)
Charles Olson Date of Birth:  1930-03-04 [facility] / [facility] [address]   [address] [place], [place]  [address] [phone]           Fax   [phone]  HPI: This pleasant 77 year old gentleman is seen for a six-month followup office visit.  He has a history of essential hypertension and a history of hypercholesterolemia.  He has a history of AV block and had a permanent pacemaker implanted about 10 years ago.  He has been told that there is an estimated 2 years of life left on the pacemaker.  He has not been expressing any chest pain or shortness of breath.  No dizziness or syncope.  No palpitations or tachycardia.  Current Outpatient Prescriptions  Medication Sig Dispense Refill  . acetaminophen (TYLENOL) 325 MG tablet Take 650 mg by mouth every 6 (six) hours as needed.        . Cholecalciferol (VITAMIN D) 1000 UNITS capsule Take 1,000 Units by mouth daily.        . flunisolide (AEROBID) 250 MCG/ACT inhaler Inhale 2 puffs into the lungs daily.        Marland Kitchen Lysine 500 MG TABS Take by mouth daily.        . Multiple Vitamin (MULTIVITAMIN) tablet Take 1 tablet by mouth daily.        . pantoprazole (PROTONIX) 40 MG tablet Take 40 mg by mouth daily.        . simvastatin (ZOCOR) 40 MG tablet Take 40 mg by mouth at bedtime.          Allergies  Allergen Reactions  . Aspirin   . Erythromycin     Patient Active Problem List  Diagnoses  . HYPERLIPIDEMIA  . Essential hypertension, benign  . ATRIOVENTRICULAR BLOCK  . GERD  . PACEMAKER, PERMANENT    History  Smoking status  . Never Smoker   Smokeless tobacco  . Not on file    History  Alcohol Use No    Family History  Problem Relation Age of Onset  . Heart attack Brother     Review of Systems: The patient denies any heat or cold intolerance.  No weight gain or weight loss.  The patient denies headaches or blurry vision.  There is no cough or sputum production.  The patient denies dizziness.  There is no  hematuria or hematochezia.  The patient denies any muscle aches or arthritis.  The patient denies any rash.  The patient denies frequent falling or instability.  There is no history of depression or anxiety.  All other systems were reviewed and are negative.   Physical Exam: Filed Vitals:   10/21/10 1452  BP: 130/64  Pulse: 60  The general appearance reveals an alert healthy-appearing gentleman in no distress.Pupils equal and reactive.   Extraocular Movements are full.  There is no scleral icterus.  The mouth and pharynx are normal.  The neck is supple.  The carotids reveal no bruits.  The jugular venous pressure is normal.  The thyroid is not enlarged.  There is no lymphadenopathy.The chest is clear to percussion and auscultation. There are no rales or rhonchi. Expansion of the chest is symmetrical.The precordium is quiet.  The first heart sound is normal.  The second heart sound is physiologically split.  There is no murmur gallop rub or click.  There is no abnormal lift or heave.The abdomen is soft and nontender. Bowel sounds are normal. The liver and spleen are not enlarged.  There Are no abdominal masses. There are no bruits.The pedal pulses are good.  There is no phlebitis or edema.  There is no cyanosis or clubbing.Strength is normal and symmetrical in all extremities.  There is no lateralizing weakness.  There are no sensory deficits.    Assessment / Plan: Continue present regimen.  Recheck in 6 months.

## 2010-10-21 NOTE — Assessment & Plan Note (Signed)
Patient has a history of essential hypertension.  He has not been expressing any headaches or dizzy spells or other symptoms of high blood pressure.  Energy level is good and he is physically reactive working around his house and yard.

## 2010-10-21 NOTE — Assessment & Plan Note (Signed)
The patient has a past history of AV block.  He has a functioning dual-chamber pacemaker.  He has been told that he is pacemaker dependent.  His pacemaker is followed by Dr. Johney Frame.

## 2010-10-21 NOTE — Assessment & Plan Note (Signed)
The patient has a history of hyperlipidemia.He is on simvastatin 40 mg daily.  He had recent lab work at the [facility] but has not been able to find out the results yet.  He is not having any adverse effects from the simvastatin in terms of myopathy

## 2010-11-02 ENCOUNTER — Encounter: Payer: Self-pay | Admitting: *Deleted

## 2010-11-25 NOTE — Progress Notes (Signed)
PPM remote 

## 2011-01-13 ENCOUNTER — Other Ambulatory Visit: Payer: Self-pay

## 2011-01-13 ENCOUNTER — Encounter: Payer: Self-pay | Admitting: Internal Medicine

## 2011-01-13 ENCOUNTER — Ambulatory Visit (INDEPENDENT_AMBULATORY_CARE_PROVIDER_SITE_OTHER): Payer: Medicare Other | Admitting: *Deleted

## 2011-01-13 DIAGNOSIS — R001 Bradycardia, unspecified: Secondary | ICD-10-CM

## 2011-01-13 DIAGNOSIS — I498 Other specified cardiac arrhythmias: Secondary | ICD-10-CM

## 2011-01-17 LAB — REMOTE PACEMAKER DEVICE
AL AMPLITUDE: 2.8 mv
AL THRESHOLD: 0.5 V
BAMS-0001: 175 {beats}/min
BATTERY VOLTAGE: 2.73 V
RV LEAD IMPEDENCE PM: 460 Ohm
RV LEAD THRESHOLD: 0.75 V

## 2011-01-25 ENCOUNTER — Encounter: Payer: Self-pay | Admitting: *Deleted

## 2011-02-01 NOTE — Progress Notes (Signed)
Pacer checked by remote 

## 2011-02-17 LAB — CBC
HCT: 44.2
Hemoglobin: 14
Hemoglobin: 15.1
MCHC: 34.2
MCHC: 34.6
MCV: 94.2
MCV: 94.5
Platelets: 124 — ABNORMAL LOW
Platelets: 149 — ABNORMAL LOW
RBC: 3.99 — ABNORMAL LOW
RBC: 4.07 — ABNORMAL LOW
RBC: 4.31
RBC: 4.68
RDW: 13.3
WBC: 10.3
WBC: 11.1 — ABNORMAL HIGH
WBC: 11.4 — ABNORMAL HIGH
WBC: 9.5

## 2011-02-17 LAB — DIFFERENTIAL
Basophils Absolute: 0
Basophils Relative: 0
Eosinophils Absolute: 0.1
Eosinophils Relative: 1
Lymphocytes Relative: 16
Lymphs Abs: 1.7
Monocytes Absolute: 0.4
Monocytes Relative: 4
Neutro Abs: 8.1 — ABNORMAL HIGH
Neutrophils Relative %: 79 — ABNORMAL HIGH

## 2011-02-17 LAB — BASIC METABOLIC PANEL
BUN: 11
Chloride: 107
Creatinine, Ser: 1.06
GFR calc Af Amer: >60
GFR calc non Af Amer: >60
Potassium: 4

## 2011-02-17 LAB — POCT I-STAT, CHEM 8
BUN: 19
Chloride: 107
Glucose, Bld: 145 — ABNORMAL HIGH
HCT: 44
Potassium: 3.7
Sodium: 139

## 2011-02-17 LAB — HEPATIC FUNCTION PANEL
ALT: 48
AST: 52 — ABNORMAL HIGH
Alkaline Phosphatase: 69
Bilirubin, Direct: 0.3
Indirect Bilirubin: 0.8
Total Protein: 5.4 — ABNORMAL LOW

## 2011-02-17 LAB — COMPREHENSIVE METABOLIC PANEL
ALT: 26
AST: 37
Alkaline Phosphatase: 82
BUN: 18
CO2: 29
Calcium: 8.9
Calcium: 9.3
Chloride: 104
Creatinine, Ser: 1.15
Creatinine, Ser: 1.25
GFR calc Af Amer: >60
GFR calc non Af Amer: >60
Glucose, Bld: 149 — ABNORMAL HIGH
Glucose, Bld: 150 — ABNORMAL HIGH
Potassium: 3.5
Sodium: 140
Total Bilirubin: 1.1
Total Protein: 7

## 2011-02-17 LAB — COMPREHENSIVE METABOLIC PANEL WITH GFR
ALT: 20
AST: 34
Albumin: 4
CO2: 23
Chloride: 107
GFR calc Af Amer: >60
GFR calc non Af Amer: 56 — ABNORMAL LOW
Total Bilirubin: 0.9

## 2011-02-17 LAB — URINALYSIS, ROUTINE W REFLEX MICROSCOPIC
Bilirubin Urine: NEGATIVE
Glucose, UA: NEGATIVE
Hgb urine dipstick: NEGATIVE
Ketones, ur: 15 — AB
Nitrite: NEGATIVE
Protein, ur: NEGATIVE
Specific Gravity, Urine: 1.041 — ABNORMAL HIGH
Urobilinogen, UA: 1
pH: 7.5

## 2011-02-17 LAB — LACTIC ACID, PLASMA
Lactic Acid, Venous: 1
Lactic Acid, Venous: 3.6 — ABNORMAL HIGH

## 2011-02-17 LAB — LIPASE, BLOOD: Lipase: 40

## 2011-03-28 ENCOUNTER — Telehealth: Payer: Self-pay | Admitting: Internal Medicine

## 2011-03-28 NOTE — Telephone Encounter (Signed)
New message:  Has a question about changing her phones at home.  She may be going to cell phones only and wants to know about coming in for these instead of calling in.  She would like to speak to you as soon as possible,  Hoping to make change today.  Her cell is [phone]

## 2011-03-28 NOTE — Telephone Encounter (Signed)
Spoke with patient's wife, Windell Moulding.  They are considering going to cell phone service only and wanted to know how that would affect his remote transmissions.  He will send his the end of this month then be seen in the office in March. At that appointment she will let us know what they have decided for phone service and we will discuss follow up.

## 2011-04-21 ENCOUNTER — Other Ambulatory Visit: Payer: Self-pay

## 2011-04-21 ENCOUNTER — Encounter: Payer: Self-pay | Admitting: Internal Medicine

## 2011-04-21 ENCOUNTER — Ambulatory Visit (INDEPENDENT_AMBULATORY_CARE_PROVIDER_SITE_OTHER): Payer: Medicare Other | Admitting: *Deleted

## 2011-04-21 DIAGNOSIS — I4430 Unspecified atrioventricular block: Secondary | ICD-10-CM

## 2011-04-21 DIAGNOSIS — Z950 Presence of cardiac pacemaker: Secondary | ICD-10-CM

## 2011-04-22 LAB — REMOTE PACEMAKER DEVICE
AL THRESHOLD: 0.625 V
ATRIAL PACING PM: 10
BAMS-0001: 175 {beats}/min
RV LEAD THRESHOLD: 0.625 V

## 2011-04-26 NOTE — Progress Notes (Signed)
Remote pacer check  

## 2011-05-19 ENCOUNTER — Encounter: Payer: Self-pay | Admitting: *Deleted

## 2011-07-05 ENCOUNTER — Encounter: Payer: Self-pay | Admitting: Cardiology

## 2011-07-28 ENCOUNTER — Encounter: Payer: Self-pay | Admitting: Internal Medicine

## 2011-07-28 ENCOUNTER — Ambulatory Visit (INDEPENDENT_AMBULATORY_CARE_PROVIDER_SITE_OTHER): Payer: Medicare Other | Admitting: Internal Medicine

## 2011-07-28 DIAGNOSIS — I442 Atrioventricular block, complete: Secondary | ICD-10-CM

## 2011-07-28 DIAGNOSIS — I4430 Unspecified atrioventricular block: Secondary | ICD-10-CM

## 2011-07-28 DIAGNOSIS — Z950 Presence of cardiac pacemaker: Secondary | ICD-10-CM

## 2011-07-28 NOTE — Progress Notes (Signed)
PCP:  Garlan Fillers, MD, MD Primary Cardiologist:  Dr Patty Sermons  The patient presents today for routine electrophysiology followup.  Since last being seen in our clinic, the patient reports doing very well.  Today, he denies symptoms of palpitations, chest pain, shortness of breath, orthopnea, PND, lower extremity edema, dizziness, presyncope, syncope, or neurologic sequela.  The patient feels that he is tolerating medications without difficulties and is otherwise without complaint today.   Past Medical History  Diagnosis Date  . AVB (atrioventricular block)     HIGH DEGREE  . Hyperlipidemia   . GERD (gastroesophageal reflux disease)   . Sinusitis   . History of orthostatic hypotension   . Syncope and collapse   . Chest pain    Past Surgical History  Procedure Date  . Insert / replace / remove pacemaker 11/1998  . Cardiac catheterization 06/2003    NORMAL CORONARIES    Current Outpatient Prescriptions  Medication Sig Dispense Refill  . Cholecalciferol (VITAMIN D) 1000 UNITS capsule Take 1,000 Units by mouth daily.        . flunisolide (AEROBID) 250 MCG/ACT inhaler Inhale 2 puffs into the lungs daily.        Marland Kitchen Lysine 500 MG TABS Take by mouth daily.        . Multiple Vitamin (MULTIVITAMIN) tablet Take 1 tablet by mouth daily.        . pantoprazole (PROTONIX) 40 MG tablet Take 40 mg by mouth daily.        . simvastatin (ZOCOR) 40 MG tablet Take 40 mg by mouth at bedtime.        Marland Kitchen acetaminophen (TYLENOL) 325 MG tablet Take 650 mg by mouth every 6 (six) hours as needed.          Allergies  Allergen Reactions  . Aspirin   . Erythromycin     History   Social History  . Marital Status: Married    Spouse Name: N/A    Number of Children: N/A  . Years of Education: N/A   Occupational History  . Not on file.   Social History Main Topics  . Smoking status: Never Smoker   . Smokeless tobacco: Not on file  . Alcohol Use: No  . Drug Use: No  . Sexually Active:    Other  Topics Concern  . Not on file   Social History Narrative  . No narrative on file    Family History  Problem Relation Age of Onset  . Heart attack Brother     Physical Exam: Filed Vitals:   07/28/11 0932  BP: 128/66  Pulse: 70  Resp: 18  Height: 5\' 10"  (1.778 m)  Weight: 157 lb 12.8 oz (71.578 kg)    GEN- The patient is well appearing, alert and oriented x 3 today.   Head- normocephalic, atraumatic Eyes-  Sclera clear, conjunctiva pink Ears- hearing intact Oropharynx- clear Neck- supple, no JVP Lymph- no cervical lymphadenopathy Lungs- Clear to ausculation bilaterally, normal work of breathing Chest- pacemaker pocket is well healed Heart- Regular rate and rhythm, no murmurs, rubs or gallops, PMI not laterally displaced GI- soft, NT, ND, + BS Extremities- no clubbing, cyanosis, or edema  Pacemaker interrogation- reviewed in detail today,  See PACEART report  Assessment and Plan:

## 2011-07-28 NOTE — Assessment & Plan Note (Signed)
Normal pacemaker function See Pace Art report No changes today  

## 2011-07-28 NOTE — Patient Instructions (Signed)
Your physician wants you to follow-up in: 12 months with Dr Roselee Culver will receive a reminder letter in the mail two months in advance. If you don't receive a letter, please call our office to schedule the follow-up appointment.   Remote monitoring is used to monitor your Pacemaker of ICD from home. This monitoring reduces the number of office visits required to check your device to one time per year. It allows [place] to keep an eye on the functioning of your device to ensure it is working properly. You are scheduled for a device check from home on 10/27/11. You may send your transmission at any time that day. If you have a wireless device, the transmission will be sent automatically. After your physician reviews your transmission, you will receive a postcard with your next transmission date.

## 2011-08-26 ENCOUNTER — Encounter: Payer: Self-pay | Admitting: Cardiology

## 2011-09-05 ENCOUNTER — Ambulatory Visit (INDEPENDENT_AMBULATORY_CARE_PROVIDER_SITE_OTHER): Payer: Medicare Other | Admitting: Cardiology

## 2011-09-05 ENCOUNTER — Encounter: Payer: Self-pay | Admitting: Cardiology

## 2011-09-05 VITALS — BP 130/80 | HR 80 | Ht 69.0 in | Wt 157.0 lb

## 2011-09-05 DIAGNOSIS — I119 Hypertensive heart disease without heart failure: Secondary | ICD-10-CM

## 2011-09-05 DIAGNOSIS — E785 Hyperlipidemia, unspecified: Secondary | ICD-10-CM

## 2011-09-05 DIAGNOSIS — I10 Essential (primary) hypertension: Secondary | ICD-10-CM

## 2011-09-05 DIAGNOSIS — E7800 Pure hypercholesterolemia, unspecified: Secondary | ICD-10-CM

## 2011-09-05 NOTE — Patient Instructions (Signed)
Your physician wants you to follow-up in: 6 months with Dr.Brackbill. You will receive a reminder letter in the mail two months in advance. If you don't receive a letter, please call our office to schedule the follow-up appointment.  

## 2011-09-05 NOTE — Progress Notes (Signed)
Charles Olson Date of Birth:  1929/07/11 [facility] [address] [place], [place]  [address] [phone]  Fax   [phone]  HPI: This pleasant 78 year old woman is seen for a six-month followup office visit.  He has a past history of hypertension and a history of hypercholesterolemia.  He also has a history of idiopathic AV block and had a permanent pacemaker implanted about 10 years ago.  He has been doing well.  He has been told that his pacemaker still has another one or 2 years of battery life remaining.  Current Outpatient Prescriptions  Medication Sig Dispense Refill  . acetaminophen (TYLENOL) 325 MG tablet Take 650 mg by mouth every 6 (six) hours as needed.        . Cholecalciferol (VITAMIN D) 1000 UNITS capsule Take 1,000 Units by mouth daily.        . flunisolide (AEROBID) 250 MCG/ACT inhaler Inhale 2 puffs into the lungs daily.        Marland Kitchen Lysine 500 MG TABS Take by mouth daily.        . Multiple Vitamin (MULTIVITAMIN) tablet Take 1 tablet by mouth daily.        . pantoprazole (PROTONIX) 40 MG tablet Take 40 mg by mouth daily.        . simvastatin (ZOCOR) 40 MG tablet Take 40 mg by mouth at bedtime.          Allergies  Allergen Reactions  . Aspirin   . Erythromycin     Patient Active Problem List  Diagnoses  . HYPERLIPIDEMIA  . Essential hypertension, benign  . Complete heart block  . GERD  . PACEMAKER, PERMANENT    History  Smoking status  . Never Smoker   Smokeless tobacco  . Not on file    History  Alcohol Use No    Family History  Problem Relation Age of Onset  . Heart attack Brother     Review of Systems: The patient denies any heat or cold intolerance.  No weight gain or weight loss.  The patient denies headaches or blurry vision.  There is no cough or sputum production.  The patient denies dizziness.  There is no hematuria or hematochezia.  The patient denies any muscle aches or arthritis.  The patient denies any rash.   The patient denies frequent falling or instability.  There is no history of depression or anxiety.  All other systems were reviewed and are negative.   Physical Exam: Filed Vitals:   09/05/11 1405  BP: 130/80  Pulse: 80   the general appearance reveals a healthy-appearing elderly gentleman in no distress.The head and neck exam reveals pupils equal and reactive.  Extraocular movements are full.  There is no scleral icterus.  The mouth and pharynx are normal.  The neck is supple.  The carotids reveal no bruits.  The jugular venous pressure is normal.  The  thyroid is not enlarged.  There is no lymphadenopathy.  The chest is clear to percussion and auscultation.  There are no rales or rhonchi.  Expansion of the chest is symmetrical.  The precordium is quiet.  The first heart sound is normal.  The second heart sound is physiologically split.  There is no murmur gallop rub or click.  There is no abnormal lift or heave.  The abdomen is soft and nontender.  The bowel sounds are normal.  The liver and spleen are not enlarged.  There are no abdominal masses.  There are  no abdominal bruits.  Extremities reveal good pedal pulses.  There is no phlebitis or edema.  There is no cyanosis or clubbing.  Strength is normal and symmetrical in all extremities.  There is no lateralizing weakness.  There are no sensory deficits.  The skin is warm and dry.  There is no rash.      Assessment / Plan: Continue present medication.  Recheck in 6 months for followup office visit.  This summer he and his wife will be taking a cruise to [place].

## 2011-09-05 NOTE — Assessment & Plan Note (Signed)
The patient does not have to take any medication for blood pressure at this time.  He is watching his dietary salt

## 2011-09-05 NOTE — Assessment & Plan Note (Signed)
The patient has a history of hyperlipidemia.  He is on simvastatin milligrams daily.  He brought with him today lab work from the [facility] which shows that his total cholesterol is 147 and his LDL is 77 and his triglycerides are 84 and his HDL is 53.  His liver function studies are normal

## 2011-09-05 NOTE — Progress Notes (Signed)
Patient ID: Charles Olson, male   DOB: 11-Nov-1929, 78 y.o.   MRN: [id number]

## 2011-10-27 ENCOUNTER — Encounter: Payer: Medicare Other | Admitting: *Deleted

## 2011-11-07 ENCOUNTER — Encounter: Payer: Self-pay | Admitting: *Deleted

## 2011-11-28 ENCOUNTER — Ambulatory Visit (INDEPENDENT_AMBULATORY_CARE_PROVIDER_SITE_OTHER): Payer: Medicare Other | Admitting: *Deleted

## 2011-11-28 ENCOUNTER — Encounter: Payer: Self-pay | Admitting: Internal Medicine

## 2011-11-28 DIAGNOSIS — I442 Atrioventricular block, complete: Secondary | ICD-10-CM

## 2011-11-28 LAB — PACEMAKER DEVICE OBSERVATION
AL AMPLITUDE: 4 mv
AL THRESHOLD: 0.5 V
BAMS-0001: 175 {beats}/min
BATTERY VOLTAGE: 2.7 V
RV LEAD IMPEDENCE PM: 443 Ohm

## 2011-11-28 NOTE — Progress Notes (Signed)
Pacer check in clinic  

## 2012-01-12 DIAGNOSIS — R410 Disorientation, unspecified: Secondary | ICD-10-CM | POA: Insufficient documentation

## 2012-03-05 ENCOUNTER — Ambulatory Visit (INDEPENDENT_AMBULATORY_CARE_PROVIDER_SITE_OTHER): Payer: Medicare Other | Admitting: Cardiology

## 2012-03-05 ENCOUNTER — Encounter: Payer: Self-pay | Admitting: Cardiology

## 2012-03-05 VITALS — BP 132/80 | HR 65 | Ht 69.0 in | Wt 155.6 lb

## 2012-03-05 DIAGNOSIS — E7800 Pure hypercholesterolemia, unspecified: Secondary | ICD-10-CM

## 2012-03-05 DIAGNOSIS — I10 Essential (primary) hypertension: Secondary | ICD-10-CM

## 2012-03-05 DIAGNOSIS — E785 Hyperlipidemia, unspecified: Secondary | ICD-10-CM

## 2012-03-05 DIAGNOSIS — I119 Hypertensive heart disease without heart failure: Secondary | ICD-10-CM

## 2012-03-05 DIAGNOSIS — I4430 Unspecified atrioventricular block: Secondary | ICD-10-CM

## 2012-03-05 NOTE — Assessment & Plan Note (Signed)
The patient is on simvastatin for his high cholesterol.  He has been concerned that the simvastatin might be causing him to have some short-term memory loss.  He has discussed this with Dr. Eloise Harman and they are considering switching him to a different agent.

## 2012-03-05 NOTE — Patient Instructions (Addendum)
Your physician recommends that you continue on your current medications as directed. Please refer to the Current Medication list given to you today.  Your physician wants you to follow-up in: 6 months. You will receive a reminder letter in the mail two months in advance. If you don't receive a letter, please call our office to schedule the follow-up appointment.  

## 2012-03-05 NOTE — Progress Notes (Signed)
[name] Date of Birth:  01/09/1930 [facility] [address] [place], [place]  [address] [phone]  Fax   [phone]  HPI: This pleasant 78 year old gentleman is seen for a six-month followup office visit. He has a past history of hypertension and a history of hypercholesterolemia. He also has a history of idiopathic AV block and had a permanent pacemaker implanted about 10 years ago. He has been doing well. He has been told that his pacemaker still has another one or 2 years of battery life remaining.   Current Outpatient Prescriptions  Medication Sig Dispense Refill  . acetaminophen (TYLENOL) 325 MG tablet Take 650 mg by mouth every 6 (six) hours as needed.        . Cholecalciferol (VITAMIN D) 1000 UNITS capsule Take 1,000 Units by mouth daily.        . flunisolide (AEROBID) 250 MCG/ACT inhaler Inhale 2 puffs into the lungs daily.        Marland Kitchen Lysine 500 MG TABS Take by mouth daily.        . Multiple Vitamin (MULTIVITAMIN) tablet Take 1 tablet by mouth daily.        . pantoprazole (PROTONIX) 40 MG tablet Take 40 mg by mouth daily.        . simvastatin (ZOCOR) 40 MG tablet Take 40 mg by mouth at bedtime.          Allergies  Allergen Reactions  . Aspirin   . Erythromycin     Patient Active Problem List  Diagnosis  . HYPERLIPIDEMIA  . Essential hypertension, benign  . Complete heart block  . GERD  . PACEMAKER, PERMANENT    History  Smoking status  . Never Smoker   Smokeless tobacco  . Not on file    History  Alcohol Use No    Family History  Problem Relation Age of Onset  . Heart attack Brother     Review of Systems: The patient denies any heat or cold intolerance.  No weight gain or weight loss.  The patient denies headaches or blurry vision.  There is no cough or sputum production.  The patient denies dizziness.  There is no hematuria or hematochezia.  The patient denies any muscle aches or arthritis.  The patient denies any rash.   The patient denies frequent falling or instability.  There is no history of depression or anxiety.  All other systems were reviewed and are negative.   Physical Exam: Filed Vitals:   03/05/12 1047  BP: 132/80  Pulse:    the general appearance reveals a well-developed well-nourished gentleman in no distress.  He is wearing hearing aids now.The head and neck exam reveals pupils equal and reactive.  Extraocular movements are full.  There is no scleral icterus.  The mouth and pharynx are normal.  The neck is supple.  The carotids reveal no bruits.  The jugular venous pressure is normal.  The  thyroid is not enlarged.  There is no lymphadenopathy.  The chest is clear to percussion and auscultation.  There are no rales or rhonchi.  Expansion of the chest is symmetrical.  The precordium is quiet.  The first heart sound is normal.  The second heart sound is physiologically split.  There is no murmur gallop rub or click.  There is no abnormal lift or heave.  The abdomen is soft and nontender.  The bowel sounds are normal.  The liver and spleen are not enlarged.  There are no abdominal  masses.  There are no abdominal bruits.  Extremities reveal good pedal pulses.  There is no phlebitis or edema.  There is no cyanosis or clubbing.  Strength is normal and symmetrical in all extremities.  There is no lateralizing weakness.  There are no sensory deficits.  The skin is warm and dry.  There is no rash.      Assessment / Plan: Continue same medication.  Recheck in 6 months for followup office visit.  Possible change in cholesterol therapy as per his PCP

## 2012-03-05 NOTE — Assessment & Plan Note (Signed)
The patient's blood pressure is slightly elevated today.  Later in the exam I took his pressure again and it has come down to 132/80.  He states that when he goes to see Dr. Jarold Motto and when he goes to the [facility] his blood pressure is always elevated there as well.  He has been checking his blood pressure at home and keeping a record and his pressures have been normal at home.  He is not having any symptoms of high blood pressure

## 2012-06-05 ENCOUNTER — Ambulatory Visit (INDEPENDENT_AMBULATORY_CARE_PROVIDER_SITE_OTHER): Payer: Medicare Other | Admitting: Cardiology

## 2012-06-05 ENCOUNTER — Encounter: Payer: Self-pay | Admitting: Cardiology

## 2012-06-05 VITALS — BP 166/62 | HR 57 | Ht 70.0 in | Wt 155.0 lb

## 2012-06-05 DIAGNOSIS — I10 Essential (primary) hypertension: Secondary | ICD-10-CM

## 2012-06-05 DIAGNOSIS — I442 Atrioventricular block, complete: Secondary | ICD-10-CM

## 2012-06-05 DIAGNOSIS — Z950 Presence of cardiac pacemaker: Secondary | ICD-10-CM

## 2012-06-05 LAB — PACEMAKER DEVICE OBSERVATION
AL IMPEDENCE PM: 399 Ohm
ATRIAL PACING PM: 10.2
BATTERY VOLTAGE: 2.67 V
RV LEAD IMPEDENCE PM: 441 Ohm
RV LEAD THRESHOLD: 0.75 V

## 2012-06-05 NOTE — Progress Notes (Addendum)
ELECTROPHYSIOLOGY OFFICE NOTE  Patient ID: Charles Olson MRN: [id number], DOB/AGE: October 23, 1929   Date of Visit: 06/05/2012  Primary Physician: Garlan Fillers, MD Primary Cardiologist: Patty Sermons, MD Primary EP: Johney Frame, MD Reason for Visit: EP/device follow-up  History of Present Illness  Charles Olson is an 79 year old man with high grade AV block s/p PPM who presents today for routine electrophysiology followup. Since last being seen in our clinic, Charles Olson reports he is doing well. Today, he denies any complaints. He specifically denies CP, SOB, palpitations, LE swelling, orthopnea or PND. He denies dizziness, near syncope or syncope. He is also followed by Dr. Patty Sermons and the [facility] clinic.  Past Medical History Past Medical History  Diagnosis Date  . AVB (atrioventricular block)     HIGH DEGREE  . Hyperlipidemia   . GERD (gastroesophageal reflux disease)   . Sinusitis   . History of orthostatic hypotension   . Syncope and collapse   . Chest pain     Past Surgical History Past Surgical History  Procedure Date  . Insert / replace / remove pacemaker 11/1998  . Cardiac catheterization 06/2003    NORMAL CORONARIES     Allergies/Intolerances Allergies  Allergen Reactions  . Aspirin   . Erythromycin     Current Home Medications Current Outpatient Prescriptions  Medication Sig Dispense Refill  . acetaminophen (TYLENOL) 325 MG tablet Take 650 mg by mouth every 6 (six) hours as needed.        . Cholecalciferol (VITAMIN D) 1000 UNITS capsule Take 1,000 Units by mouth daily.        . flunisolide (AEROBID) 250 MCG/ACT inhaler Inhale 2 puffs into the lungs daily.        Marland Kitchen Lysine 500 MG TABS Take by mouth daily.        . Multiple Vitamin (MULTIVITAMIN) tablet Take 1 tablet by mouth daily.        . pantoprazole (PROTONIX) 40 MG tablet Take 40 mg by mouth daily.        . pravastatin (PRAVACHOL) 80 MG tablet Take 80 mg by mouth daily.        Social History Social History  . Marital  Status: Married   Social History Main Topics  . Smoking status: Never Smoker   . Smokeless tobacco: Not on file  . Alcohol Use: No  . Drug Use: No   Review of Systems General: No chills, fever, night sweats or weight changes Cardiovascular: No chest pain, dyspnea on exertion, edema, orthopnea, palpitations, paroxysmal nocturnal dyspnea Dermatological: No rash, lesions or masses Respiratory: No cough, dyspnea Urologic: No hematuria, dysuria Abdominal: No nausea, vomiting, diarrhea, bright red blood per rectum, melena, or hematemesis Neurologic: No visual changes, weakness, changes in mental status All other systems reviewed and are otherwise negative except as noted above.  Physical Exam Blood pressure 166/62, pulse 57, height 5\' 10"  (1.778 m), weight 155 lb (70.308 kg), SpO2 98.00%.  General: Well developed, well appearing 79 year old male in no acute distress. HEENT: Normocephalic, atraumatic. EOMs intact. Sclera nonicteric. Oropharynx clear.  Neck: Supple without bruits. No JVD. Lungs: Respirations regular and unlabored, CTA bilaterally. No wheezes, rales or rhonchi. Heart: RRR. S1, S2 present. No murmurs, rub, S3 or S4. Abdomen: Soft, non-distended.  Extremities: No clubbing, cyanosis or edema. DP/PT/Radials 2+ and equal bilaterally. Psych: Normal affect. Neuro: Alert and oriented X 3. Moves all extremities spontaneously.   Diagnostics Device interrogation performed by me in clinic shows normal PPM function with stable  lead measurements/parameters; battery longevity estimated at 12 months (range 2-21 months); will start monthly device checks to monitor battery; histograms appropriate; no episodes; no programming changes made; see PaceArt report Outpatient BP log reviewed - BP range 116-141/58-75, consistently ~128/66  Assessment and Plan 1. High grade AV block s/p PPM Normal device function ADDENDUM: Battery nearing ERI so will start monthly device clinic checks to monitor  battery status See PaceArt report 2. Hypertension Elevated today but repeat BP in office improved 135/68 Charles Olson reports his BP has been well controlled at home Continue current regimen Followed by Dr. Eloise Harman and the [facility] clinic (has appointment in 2-3 weeks with Dr. Eloise Harman)  Signed, Rick Duff, PA-C 06/05/2012, 10:19 AM

## 2012-06-05 NOTE — Patient Instructions (Addendum)
Your physician wants you to follow-up in: 6 months with Dr. Allred. You will receive a reminder letter in the mail two months in advance. If you don't receive a letter, please call our office to schedule the follow-up appointment.  

## 2012-06-25 ENCOUNTER — Encounter: Payer: Self-pay | Admitting: Internal Medicine

## 2012-07-05 ENCOUNTER — Encounter: Payer: Medicare Other | Admitting: Cardiology

## 2012-07-19 ENCOUNTER — Ambulatory Visit (INDEPENDENT_AMBULATORY_CARE_PROVIDER_SITE_OTHER): Payer: Medicare Other | Admitting: Cardiology

## 2012-07-19 ENCOUNTER — Encounter: Payer: Self-pay | Admitting: Internal Medicine

## 2012-07-19 DIAGNOSIS — Z45010 Encounter for checking and testing of cardiac pacemaker pulse generator [battery]: Secondary | ICD-10-CM

## 2012-07-19 DIAGNOSIS — I442 Atrioventricular block, complete: Secondary | ICD-10-CM

## 2012-07-19 DIAGNOSIS — R0989 Other specified symptoms and signs involving the circulatory and respiratory systems: Secondary | ICD-10-CM

## 2012-07-19 DIAGNOSIS — Z950 Presence of cardiac pacemaker: Secondary | ICD-10-CM

## 2012-07-19 LAB — PACEMAKER DEVICE OBSERVATION: BAMS-0001: 175 {beats}/min

## 2012-07-19 NOTE — Progress Notes (Signed)
PPM battery check only. Patient is PPM dependent. See PaceArt report. Continue monthly battery checks in office.

## 2012-08-16 ENCOUNTER — Encounter: Payer: Self-pay | Admitting: Internal Medicine

## 2012-08-16 ENCOUNTER — Ambulatory Visit (INDEPENDENT_AMBULATORY_CARE_PROVIDER_SITE_OTHER): Payer: Medicare Other | Admitting: Cardiology

## 2012-08-16 DIAGNOSIS — I442 Atrioventricular block, complete: Secondary | ICD-10-CM

## 2012-08-16 DIAGNOSIS — Z45010 Encounter for checking and testing of cardiac pacemaker pulse generator [battery]: Secondary | ICD-10-CM

## 2012-08-16 DIAGNOSIS — R0989 Other specified symptoms and signs involving the circulatory and respiratory systems: Secondary | ICD-10-CM

## 2012-08-16 DIAGNOSIS — Z950 Presence of cardiac pacemaker: Secondary | ICD-10-CM

## 2012-08-16 LAB — PACEMAKER DEVICE OBSERVATION: BAMS-0001: 175 {beats}/min

## 2012-08-16 NOTE — Progress Notes (Signed)
PPM battery check only. Battery voltage 2.67. Estimated longevity 10 months (range <1 - 19 months). Patient is PPM dependent. See PaceArt report.

## 2012-08-16 NOTE — Patient Instructions (Signed)
Your physician recommends that you schedule a follow-up appointment in: 1 month for battery check

## 2012-08-30 ENCOUNTER — Ambulatory Visit: Payer: Medicare Other | Admitting: Cardiology

## 2012-09-07 ENCOUNTER — Encounter: Payer: Self-pay | Admitting: Internal Medicine

## 2012-09-18 ENCOUNTER — Ambulatory Visit (INDEPENDENT_AMBULATORY_CARE_PROVIDER_SITE_OTHER): Payer: Medicare Other | Admitting: Cardiology

## 2012-09-18 ENCOUNTER — Other Ambulatory Visit: Payer: Self-pay | Admitting: Internal Medicine

## 2012-09-18 DIAGNOSIS — Z45018 Encounter for adjustment and management of other part of cardiac pacemaker: Secondary | ICD-10-CM

## 2012-09-18 DIAGNOSIS — Z950 Presence of cardiac pacemaker: Secondary | ICD-10-CM

## 2012-09-18 DIAGNOSIS — Z45010 Encounter for checking and testing of cardiac pacemaker pulse generator [battery]: Secondary | ICD-10-CM

## 2012-09-18 LAB — PACEMAKER DEVICE OBSERVATION: BAMS-0001: 175 {beats}/min

## 2012-09-18 NOTE — Progress Notes (Signed)
PPM battery check only.  Battery voltage 2.65.  Estimated longevity 8 months (range <1 - 17 months).  Patient is PPM dependent. Continue monthly PPM battery checks.  See PaceArt report.

## 2012-09-18 NOTE — Patient Instructions (Addendum)
Your physician recommends that you schedule a follow-up appointment in: 1 mth

## 2012-09-21 ENCOUNTER — Encounter: Payer: Self-pay | Admitting: Cardiology

## 2012-09-24 ENCOUNTER — Encounter: Payer: Self-pay | Admitting: Cardiology

## 2012-09-24 ENCOUNTER — Ambulatory Visit (INDEPENDENT_AMBULATORY_CARE_PROVIDER_SITE_OTHER): Payer: Medicare Other | Admitting: Cardiology

## 2012-09-24 VITALS — BP 122/72 | HR 65 | Ht 69.0 in | Wt 150.8 lb

## 2012-09-24 DIAGNOSIS — I10 Essential (primary) hypertension: Secondary | ICD-10-CM

## 2012-09-24 DIAGNOSIS — I119 Hypertensive heart disease without heart failure: Secondary | ICD-10-CM

## 2012-09-24 DIAGNOSIS — I442 Atrioventricular block, complete: Secondary | ICD-10-CM

## 2012-09-24 DIAGNOSIS — E785 Hyperlipidemia, unspecified: Secondary | ICD-10-CM

## 2012-09-24 NOTE — Assessment & Plan Note (Signed)
The patient gets his lipids checked at the [facility] hospital.  He brought in a copy of his recent labs which are satisfactory.  He is tolerating simvastatin 40 mg daily without side effects

## 2012-09-24 NOTE — Patient Instructions (Addendum)
Your physician recommends that you continue on your current medications as directed. Please refer to the Current Medication list given to you today.  Your physician wants you to follow-up in: 6 MONTH OV  You will receive a reminder letter in the mail two months in advance. If you don't receive a letter, please call our office to schedule the follow-up appointment.  

## 2012-09-24 NOTE — Assessment & Plan Note (Signed)
He is being followed monthly in the pacemaker clinic now because he is approaching end-of-life on the pacemaker generator

## 2012-09-24 NOTE — Progress Notes (Signed)
Charles Olson Date of Birth:  [birth year] [facility] [address] [place], [place]  [address] [phone]  Fax   [phone]  HPI: This pleasant 79 year old gentleman is seen for a six-month followup office visit. He has a past history of hypertension and a history of hypercholesterolemia. He also has a history of idiopathic AV block and had a permanent pacemaker implanted about 10 years ago. He has been doing well. He has been told that his pacemaker still has only about 6 months of battery life remaining. Since we last saw him he had successful cataract surgery on both eyes by Dr. Burgess Estelle.   Current Outpatient Prescriptions  Medication Sig Dispense Refill  . Cholecalciferol (VITAMIN D) 1000 UNITS capsule Take 1,000 Units by mouth daily.        . flunisolide (AEROBID) 250 MCG/ACT inhaler Inhale 2 puffs into the lungs daily.        Marland Kitchen Lysine 500 MG TABS Take by mouth daily.        . Multiple Vitamin (MULTIVITAMIN) tablet Take 1 tablet by mouth daily.        . pantoprazole (PROTONIX) 40 MG tablet Take 40 mg by mouth daily.        . simvastatin (ZOCOR) 40 MG tablet Take 40 mg by mouth every evening.       No current facility-administered medications for this visit.    Allergies  Allergen Reactions  . Aspirin   . Erythromycin     Patient Active Problem List   Diagnosis Date Noted  . Essential hypertension, benign 07/15/2010    Priority: High  . HYPERLIPIDEMIA 07/14/2010    Priority: High  . Complete heart block 07/14/2010    Priority: Medium  . GERD 07/14/2010  . PACEMAKER, PERMANENT 07/14/2010    History  Smoking status  . Never Smoker   Smokeless tobacco  . Not on file    History  Alcohol Use No    Family History  Problem Relation Age of Onset  . Heart attack Brother     Review of Systems: The patient denies any heat or cold intolerance.  No weight gain or weight loss.  The patient denies headaches or blurry vision.  There is no cough  or sputum production.  The patient denies dizziness.  There is no hematuria or hematochezia.  The patient denies any muscle aches or arthritis.  The patient denies any rash.  The patient denies frequent falling or instability.  There is no history of depression or anxiety.  All other systems were reviewed and are negative.   Physical Exam: Filed Vitals:   09/24/12 1513  BP: 122/72  Pulse: 65   the general appearance reveals a well-developed well-nourished gentleman in no distress.The head and neck exam reveals pupils equal and reactive.  Extraocular movements are full.  There is no scleral icterus.  The mouth and pharynx are normal.  The neck is supple.  The carotids reveal no bruits.  The jugular venous pressure is normal.  The  thyroid is not enlarged.  There is no lymphadenopathy.  The chest is clear to percussion and auscultation.  There are no rales or rhonchi.  Expansion of the chest is symmetrical.  The precordium is quiet.  The first heart sound is normal.  The second heart sound is physiologically split.  There is no murmur gallop rub or click.  There is no abnormal lift or heave.  The abdomen is soft and nontender.  The bowel sounds are  normal.  The liver and spleen are not enlarged.  There are no abdominal masses.  There are no abdominal bruits.  Extremities reveal good pedal pulses.  There is no phlebitis or edema.  There is no cyanosis or clubbing.  Strength is normal and symmetrical in all extremities.  There is no lateralizing weakness.  There are no sensory deficits.  The skin is warm and dry.  There is no rash.  EKG shows normal sinus rhythm at 65 per minute with ventricular pacing.    Assessment / Plan: Continue same medication.  Recheck in 6 months.

## 2012-09-24 NOTE — Assessment & Plan Note (Signed)
Blood pressure was remaining stable on current therapy 

## 2012-10-17 ENCOUNTER — Encounter: Payer: Self-pay | Admitting: Internal Medicine

## 2012-10-17 ENCOUNTER — Ambulatory Visit: Payer: Medicare Other | Admitting: *Deleted

## 2012-10-17 DIAGNOSIS — I442 Atrioventricular block, complete: Secondary | ICD-10-CM

## 2012-10-17 LAB — PACEMAKER DEVICE OBSERVATION
BAMS-0001: 175 {beats}/min
RV LEAD IMPEDENCE PM: 444 Ohm
VENTRICULAR PACING PM: 99

## 2012-10-17 NOTE — Progress Notes (Signed)
PPM interrogation for battery data

## 2012-11-21 ENCOUNTER — Encounter: Payer: Self-pay | Admitting: Internal Medicine

## 2012-11-21 ENCOUNTER — Other Ambulatory Visit: Payer: Self-pay | Admitting: Internal Medicine

## 2012-11-21 ENCOUNTER — Ambulatory Visit (INDEPENDENT_AMBULATORY_CARE_PROVIDER_SITE_OTHER): Payer: Medicare Other | Admitting: Internal Medicine

## 2012-11-21 VITALS — BP 140/78 | HR 59 | Ht 70.5 in | Wt 151.8 lb

## 2012-11-21 DIAGNOSIS — Z950 Presence of cardiac pacemaker: Secondary | ICD-10-CM

## 2012-11-21 DIAGNOSIS — I10 Essential (primary) hypertension: Secondary | ICD-10-CM

## 2012-11-21 DIAGNOSIS — I442 Atrioventricular block, complete: Secondary | ICD-10-CM

## 2012-11-21 NOTE — Progress Notes (Addendum)
PCP: Garlan Fillers, MD Primary Cardiologist: Cassell Clement, MD  Charles Olson is a 79 y.o. male who presents today for routine electrophysiology followup.  Since last being seen in our clinic, the patient reports doing very well.  He has had cataract surgery recently. He has also been seen by Dr Patty Sermons.     Today, he denies symptoms of palpitations, chest pain, shortness of breath,  lower extremity edema, dizziness, presyncope, or syncope.  The patient is otherwise without complaint today.  His device has been nearing ERI and he has been followed monthly in the device clinic.  He is unable to follow via [facility] because he has no landline phone.   Past Medical History  Diagnosis Date  . AVB (atrioventricular block)     HIGH DEGREE  . Hyperlipidemia   . GERD (gastroesophageal reflux disease)   . Sinusitis   . History of orthostatic hypotension   . Syncope and collapse   . Chest pain    Past Surgical History  Procedure Laterality Date  . Insert / replace / remove pacemaker  11/1998    Medtronic [id number] implanted by Dr Reyes Ivan in 2008  . Cardiac catheterization  06/2003    NORMAL CORONARIES    Current Outpatient Prescriptions  Medication Sig Dispense Refill  . Cholecalciferol (VITAMIN D) 1000 UNITS capsule Take 1,000 Units by mouth daily.        . flunisolide (AEROBID) 250 MCG/ACT inhaler Inhale 2 puffs into the lungs daily.        Marland Kitchen Lysine 500 MG TABS Take by mouth daily.        . Multiple Vitamin (MULTIVITAMIN) tablet Take 1 tablet by mouth daily.        . pantoprazole (PROTONIX) 40 MG tablet Take 40 mg by mouth daily.        . simvastatin (ZOCOR) 40 MG tablet Take 40 mg by mouth every evening.       No current facility-administered medications for this visit.    Physical Exam: Filed Vitals:   11/21/12 0923  BP: 140/78  Pulse: 59  Height: 5' 10.5" (1.791 m)  Weight: 151 lb 12.8 oz (68.856 kg)    GEN- The patient is well appearing, alert and oriented x 3 today.    Head- normocephalic, atraumatic Eyes-  Sclera clear, conjunctiva pink Ears- hearing intact Oropharynx- clear Lungs- Clear to ausculation bilaterally, normal work of breathing Chest- R sided pacemaker pocket is well healed Heart- Regular rate and rhythm, no murmurs, rubs or gallops, PMI not laterally displaced GI- soft, NT, ND, + BS Extremities- no clubbing, cyanosis, or edema  Pacemaker interrogation- reviewed in detail today,  See PACEART report  Assessment and Plan:  1. Complete heart block Normal pacemaker function See Pace Art report No changes today Monthly visits for battery check Once ERI, will proceed with generator change  2. HTN Stable No change required today    Hillis Range, MD

## 2012-11-21 NOTE — Patient Instructions (Addendum)
Your physician recommends that you schedule a follow-up appointment in: 1 month in the device clinic.

## 2012-11-26 ENCOUNTER — Telehealth: Payer: Self-pay | Admitting: Internal Medicine

## 2012-11-26 NOTE — Telephone Encounter (Signed)
chart reviewed with Dr Johney Frame, he is ok with heart rate, stay hydrated, pt made aware and accepting of plan.

## 2012-11-26 NOTE — Telephone Encounter (Signed)
New problem  Per wife she was advised to call anytime his heartrate was 65 beats per min.

## 2012-12-01 LAB — PACEMAKER DEVICE OBSERVATION
AL AMPLITUDE: 2.8 mv
AL THRESHOLD: 0.5 V
BAMS-0001: 175 {beats}/min
BATTERY VOLTAGE: 2.61 V
RV LEAD THRESHOLD: 0.875 V

## 2012-12-07 ENCOUNTER — Encounter: Payer: Self-pay | Admitting: Cardiology

## 2012-12-20 ENCOUNTER — Encounter: Payer: Self-pay | Admitting: Internal Medicine

## 2012-12-26 ENCOUNTER — Other Ambulatory Visit: Payer: Self-pay

## 2012-12-27 ENCOUNTER — Ambulatory Visit (INDEPENDENT_AMBULATORY_CARE_PROVIDER_SITE_OTHER): Payer: Medicare Other | Admitting: *Deleted

## 2012-12-27 DIAGNOSIS — Z45018 Encounter for adjustment and management of other part of cardiac pacemaker: Secondary | ICD-10-CM

## 2012-12-27 DIAGNOSIS — Z45010 Encounter for checking and testing of cardiac pacemaker pulse generator [battery]: Secondary | ICD-10-CM

## 2012-12-27 LAB — PACEMAKER DEVICE OBSERVATION
BMOD-0003RV: 30
BRDY-0002RV: 65 {beats}/min
VENTRICULAR PACING PM: 100

## 2012-12-27 NOTE — Progress Notes (Signed)
Battery check only in clinic. Device reached ERI 2.62V on 12/18/12. Device cannot manually switch back to DDDR 40; currently at VVI 65---pt c/o feeling faint & sense of stinging in apical region.   Pt seeing Brooke tomorrow @ 3:00

## 2012-12-28 ENCOUNTER — Ambulatory Visit (INDEPENDENT_AMBULATORY_CARE_PROVIDER_SITE_OTHER): Payer: Medicare Other | Admitting: Cardiology

## 2012-12-28 ENCOUNTER — Encounter: Payer: Self-pay | Admitting: Cardiology

## 2012-12-28 ENCOUNTER — Encounter: Payer: Self-pay | Admitting: *Deleted

## 2012-12-28 VITALS — BP 126/70 | HR 64 | Ht 70.5 in | Wt 153.0 lb

## 2012-12-28 DIAGNOSIS — Z45018 Encounter for adjustment and management of other part of cardiac pacemaker: Secondary | ICD-10-CM

## 2012-12-28 DIAGNOSIS — Z45010 Encounter for checking and testing of cardiac pacemaker pulse generator [battery]: Secondary | ICD-10-CM

## 2012-12-28 DIAGNOSIS — Z950 Presence of cardiac pacemaker: Secondary | ICD-10-CM

## 2012-12-28 DIAGNOSIS — I442 Atrioventricular block, complete: Secondary | ICD-10-CM

## 2012-12-28 NOTE — Progress Notes (Signed)
ELECTROPHYSIOLOGY OFFICE NOTE  Patient ID: Charles Olson MRN: [id number], DOB/AGE: 09/11/1929   Date of Visit: 12/28/2012  Primary Physician: Garlan Fillers, MD Primary Cardiologist: Hillis Range, MD Reason for Visit: EP/device follow-up  History of Present Illness  Charles Olson is an 79 y.o. male with CHB s/p PPM implant who presents today for electrophysiology followup. His PPM battery is at [facility].   Since last being seen in our clinic, he reports he has noticed decreased energy. Otherwise he has no complaints. He denies chest pain or shortness of breath. He denies palpitations, dizziness, near syncope or syncope. He denies LE swelling, orthopnea, PND or recent weight gain. He is compliant and tolerating medications without difficulty.  Past Medical History Past Medical History  Diagnosis Date  . AVB (atrioventricular block)     HIGH DEGREE  . Hyperlipidemia   . GERD (gastroesophageal reflux disease)   . Sinusitis   . History of orthostatic hypotension   . Syncope and collapse   . Chest pain     Past Surgical History Past Surgical History  Procedure Laterality Date  . Insert / replace / remove pacemaker  11/1998    Medtronic [id number] implanted by Dr Reyes Ivan in 2008  . Cardiac catheterization  06/2003    NORMAL CORONARIES    Allergies/Intolerances Allergies  Allergen Reactions  . Aspirin   . Erythromycin    Current Home Medications Current Outpatient Prescriptions  Medication Sig Dispense Refill  . Cholecalciferol (VITAMIN D) 1000 UNITS capsule Take 1,000 Units by mouth daily.        . flunisolide (AEROBID) 250 MCG/ACT inhaler Inhale 2 puffs into the lungs daily.        Marland Kitchen Lysine 500 MG TABS Take by mouth daily.        . Multiple Vitamin (MULTIVITAMIN) tablet Take 1 tablet by mouth daily.        . pantoprazole (PROTONIX) 40 MG tablet Take 40 mg by mouth daily.        . simvastatin (ZOCOR) 40 MG tablet Take 40 mg by mouth every evening.       No current  facility-administered medications for this visit.   Social History Social History  . Marital Status: Married   Social History Main Topics  . Smoking status: Never Smoker   . Smokeless tobacco: Not on file  . Alcohol Use: No  . Drug Use: No   Review of Systems General: No chills, fever, night sweats or weight changes Cardiovascular: No chest pain, dyspnea on exertion, edema, orthopnea, palpitations, paroxysmal nocturnal dyspnea Dermatological: No rash, lesions or masses Respiratory: No cough, dyspnea Urologic: No hematuria, dysuria Abdominal: No nausea, vomiting, diarrhea, bright red blood per rectum, melena, or hematemesis Neurologic: No visual changes, weakness, changes in mental status All other systems reviewed and are otherwise negative except as noted above.  Physical Exam Vitals: Blood pressure 126/70, pulse 64, height 5' 10.5" (1.791 m), weight 153 lb (69.4 kg).  General: Well developed, well appearing 79 y.o. male in no acute distress. HEENT: Normocephalic, atraumatic. EOMs intact. Sclera nonicteric. Oropharynx clear.  Neck: Supple. No JVD. Lungs: Respirations regular and unlabored, CTA bilaterally. No wheezes, rales or rhonchi. Heart: RRR. S1, S2 present. No murmurs, rub, S3 or S4. Abdomen: Soft, non-distended.   Extremities: No clubbing, cyanosis or edema. PT/Radials 2+ and equal bilaterally. Psych: Normal affect. Neuro: Alert and oriented X 3. Moves all extremities spontaneously.   Device interrogation on 12/27/2012 reviewed - battery at [facility]; patient reverted to VVI  65 bpm; PPM dependent  Assessment and Plan 1. CHB s/p PPM implant, now PPM battery at [facility] 2. PPM dependent  Mr. Charles Olson presents for EP/device follow-up. His PPM battery is at [facility]. Discussed the need for PPM generator change with him and his wife. Reviewed the procedure involved, including risks and benefits. These risks include but are not limited to bleeding, infection and/or lead dislodgement /  malfunction. Mr. Charles Olson and his wife expressed verbal understanding of the procedure including risks and benefits and wish to proceed. Scheduled with Dr. Johney Frame on Tuesday, 01/01/2013.  Signed, Rick Duff, PA-C 12/28/2012, 3:49 PM

## 2012-12-28 NOTE — Patient Instructions (Addendum)
Your physician recommends that you return for lab work in: today 12-28-2012   Your physician recommends that you continue on your current medications as directed. Please refer to the Current Medication list given to you today.

## 2012-12-28 NOTE — Addendum Note (Signed)
Addended by: Andrey Cota A on: 12/28/2012 04:18 PM   Modules accepted: Orders

## 2012-12-29 LAB — CBC WITH DIFFERENTIAL/PLATELET
Basophils Absolute: 0 10*3/uL (ref 0.0–0.1)
Basophils Relative: 1 % (ref 0–1)
MCHC: 34.7 g/dL (ref 30.0–36.0)
Monocytes Absolute: 0.5 10*3/uL (ref 0.1–1.0)
Neutro Abs: 3 10*3/uL (ref 1.7–7.7)
Neutrophils Relative %: 45 % (ref 43–77)
Platelets: 172 10*3/uL (ref 150–400)
RDW: 14 % (ref 11.5–15.5)

## 2012-12-29 LAB — BASIC METABOLIC PANEL
CO2: 28 mEq/L (ref 19–32)
Calcium: 9.1 mg/dL (ref 8.4–10.5)
Creat: 1.31 mg/dL (ref 0.50–1.35)
Glucose, Bld: 98 mg/dL (ref 70–99)

## 2012-12-31 ENCOUNTER — Encounter (HOSPITAL_COMMUNITY): Payer: Self-pay | Admitting: Pharmacy Technician

## 2012-12-31 MED ORDER — CEFAZOLIN SODIUM-DEXTROSE 2-3 GM-% IV SOLR
2.0000 g | INTRAVENOUS | Status: AC
  Filled 2012-12-31: qty 50

## 2012-12-31 MED ORDER — SODIUM CHLORIDE 0.9 % IR SOLN
80.0000 mg | Status: AC
  Filled 2012-12-31: qty 2

## 2013-01-01 ENCOUNTER — Ambulatory Visit (HOSPITAL_COMMUNITY)
Admission: RE | Admit: 2013-01-01 | Discharge: 2013-01-01 | Disposition: A | Discharge status: Home / Self Care | Payer: Medicare Other | Source: Ambulatory Visit | Attending: Internal Medicine | Admitting: Internal Medicine

## 2013-01-01 ENCOUNTER — Other Ambulatory Visit: Payer: Self-pay

## 2013-01-01 ENCOUNTER — Encounter (HOSPITAL_COMMUNITY)
Admission: RE | Disposition: A | Discharge status: Home / Self Care | Payer: Self-pay | Source: Ambulatory Visit | Attending: Internal Medicine

## 2013-01-01 DIAGNOSIS — I495 Sick sinus syndrome: Secondary | ICD-10-CM | POA: Insufficient documentation

## 2013-01-01 DIAGNOSIS — Z79899 Other long term (current) drug therapy: Secondary | ICD-10-CM | POA: Insufficient documentation

## 2013-01-01 DIAGNOSIS — Z45018 Encounter for adjustment and management of other part of cardiac pacemaker: Secondary | ICD-10-CM | POA: Insufficient documentation

## 2013-01-01 DIAGNOSIS — E785 Hyperlipidemia, unspecified: Secondary | ICD-10-CM | POA: Insufficient documentation

## 2013-01-01 DIAGNOSIS — I951 Orthostatic hypotension: Secondary | ICD-10-CM | POA: Insufficient documentation

## 2013-01-01 DIAGNOSIS — Z950 Presence of cardiac pacemaker: Secondary | ICD-10-CM | POA: Diagnosis present

## 2013-01-01 DIAGNOSIS — Z886 Allergy status to analgesic agent status: Secondary | ICD-10-CM | POA: Insufficient documentation

## 2013-01-01 DIAGNOSIS — Z881 Allergy status to other antibiotic agents status: Secondary | ICD-10-CM | POA: Insufficient documentation

## 2013-01-01 DIAGNOSIS — K219 Gastro-esophageal reflux disease without esophagitis: Secondary | ICD-10-CM | POA: Insufficient documentation

## 2013-01-01 DIAGNOSIS — Z45010 Encounter for checking and testing of cardiac pacemaker pulse generator [battery]: Secondary | ICD-10-CM

## 2013-01-01 DIAGNOSIS — I442 Atrioventricular block, complete: Secondary | ICD-10-CM | POA: Diagnosis present

## 2013-01-01 HISTORY — PX: PACEMAKER GENERATOR CHANGE: SHX5481

## 2013-01-01 LAB — SURGICAL PCR SCREEN
MRSA, PCR: NEGATIVE
Staphylococcus aureus: NEGATIVE

## 2013-01-01 SURGERY — PACEMAKER GENERATOR CHANGE
Anesthesia: LOCAL

## 2013-01-01 MED ORDER — CHLORHEXIDINE GLUCONATE 4 % EX LIQD
60.0000 mL | Freq: Once | CUTANEOUS | Status: DC
  Filled 2013-01-01: qty 60

## 2013-01-01 MED ORDER — LIDOCAINE HCL (PF) 1 % IJ SOLN
INTRAMUSCULAR | Status: AC
  Filled 2013-01-01: qty 60

## 2013-01-01 MED ORDER — SODIUM CHLORIDE 0.9 % IV SOLN
INTRAVENOUS | Status: DC
  Administered 2013-01-01: 13:00:00 via INTRAVENOUS

## 2013-01-01 MED ORDER — MUPIROCIN 2 % EX OINT
TOPICAL_OINTMENT | CUTANEOUS | Status: AC
  Filled 2013-01-01: qty 22

## 2013-01-01 MED ORDER — MUPIROCIN 2 % EX OINT
TOPICAL_OINTMENT | Freq: Two times a day (BID) | CUTANEOUS | Status: DC
  Administered 2013-01-01: 1 via NASAL
  Filled 2013-01-01: qty 22

## 2013-01-01 NOTE — Interval H&P Note (Signed)
History and Physical Interval Note:  01/01/2013 2:44 PM  Charles Olson  has presented today for surgery, with the diagnosis of End of life  The various methods of treatment have been discussed with the patient and family. After consideration of risks, benefits and other options for treatment, the patient has consented to  Procedure(s): PACEMAKER GENERATOR CHANGE (N/A) as a surgical intervention .  The patient's history has been reviewed, patient examined, no change in status, stable for surgery.  I have reviewed the patient's chart and labs.  Questions were answered to the patient's satisfaction.     Hillis Range

## 2013-01-01 NOTE — H&P (View-Only) (Signed)
 ELECTROPHYSIOLOGY OFFICE NOTE  Patient ID: Charles Olson MRN: [id number], DOB/AGE: 77/16/1931   Date of Visit: 12/28/2012  Primary Physician: PATERSON,DANIEL G, MD Primary Cardiologist: James Allred, MD Reason for Visit: EP/device follow-up  History of Present Illness  Charles Olson is an 79 y.o. male with CHB s/p PPM implant who presents today for electrophysiology followup. His PPM battery is at ERI.   Since last being seen in our clinic, he reports he has noticed decreased energy. Otherwise he has no complaints. He denies chest pain or shortness of breath. He denies palpitations, dizziness, near syncope or syncope. He denies LE swelling, orthopnea, PND or recent weight gain. He is compliant and tolerating medications without difficulty.  Past Medical History Past Medical History  Diagnosis Date  . AVB (atrioventricular block)     HIGH DEGREE  . Hyperlipidemia   . GERD (gastroesophageal reflux disease)   . Sinusitis   . History of orthostatic hypotension   . Syncope and collapse   . Chest pain     Past Surgical History Past Surgical History  Procedure Laterality Date  . Insert / replace / remove pacemaker  11/1998    Medtronic [id number] implanted by Dr Kersey in 2008  . Cardiac catheterization  06/2003    NORMAL CORONARIES    Allergies/Intolerances Allergies  Allergen Reactions  . Aspirin   . Erythromycin    Current Home Medications Current Outpatient Prescriptions  Medication Sig Dispense Refill  . Cholecalciferol (VITAMIN D) 1000 UNITS capsule Take 1,000 Units by mouth daily.        . flunisolide (AEROBID) 250 MCG/ACT inhaler Inhale 2 puffs into the lungs daily.        . Lysine 500 MG TABS Take by mouth daily.        . Multiple Vitamin (MULTIVITAMIN) tablet Take 1 tablet by mouth daily.        . pantoprazole (PROTONIX) 40 MG tablet Take 40 mg by mouth daily.        . simvastatin (ZOCOR) 40 MG tablet Take 40 mg by mouth every evening.       No current  facility-administered medications for this visit.   Social History Social History  . Marital Status: Married   Social History Main Topics  . Smoking status: Never Smoker   . Smokeless tobacco: Not on file  . Alcohol Use: No  . Drug Use: No   Review of Systems General: No chills, fever, night sweats or weight changes Cardiovascular: No chest pain, dyspnea on exertion, edema, orthopnea, palpitations, paroxysmal nocturnal dyspnea Dermatological: No rash, lesions or masses Respiratory: No cough, dyspnea Urologic: No hematuria, dysuria Abdominal: No nausea, vomiting, diarrhea, bright red blood per rectum, melena, or hematemesis Neurologic: No visual changes, weakness, changes in mental status All other systems reviewed and are otherwise negative except as noted above.  Physical Exam Vitals: Blood pressure 126/70, pulse 64, height 5' 10.5" (1.791 m), weight 153 lb (69.4 kg).  General: Well developed, well appearing 79 y.o. male in no acute distress. HEENT: Normocephalic, atraumatic. EOMs intact. Sclera nonicteric. Oropharynx clear.  Neck: Supple. No JVD. Lungs: Respirations regular and unlabored, CTA bilaterally. No wheezes, rales or rhonchi. Heart: RRR. S1, S2 present. No murmurs, rub, S3 or S4. Abdomen: Soft, non-distended.   Extremities: No clubbing, cyanosis or edema. PT/Radials 2+ and equal bilaterally. Psych: Normal affect. Neuro: Alert and oriented X 3. Moves all extremities spontaneously.   Device interrogation on 12/27/2012 reviewed - battery at ERI; patient reverted to VVI   65 bpm; PPM dependent  Assessment and Plan 1. CHB s/p PPM implant, now PPM battery at ERI 2. PPM dependent  Charles Olson presents for EP/device follow-up. His PPM battery is at ERI. Discussed the need for PPM generator change with him and his wife. Reviewed the procedure involved, including risks and benefits. These risks include but are not limited to bleeding, infection and/or lead dislodgement /  malfunction. Charles Olson and his wife expressed verbal understanding of the procedure including risks and benefits and wish to proceed. Scheduled with Dr. Allred on Tuesday, 01/01/2013.  Signed, Criss Pallone, PA-C 12/28/2012, 3:49 PM    

## 2013-01-01 NOTE — Op Note (Signed)
SURGEON:  Hillis Range, MD     PREPROCEDURE DIAGNOSES:   1. Sick sinus syndrome with symptomatic bradycardia   2. Complete heart block     POSTPROCEDURE DIAGNOSES:   1. Sick sinus syndrome with symptomatic bradycardia   2. Complete heart block     PROCEDURES:   1. Pacemaker pulse generator replacement.   2. Skin pocket revision.     INTRODUCTION:  Charles Olson is a 79 y.o. male with a history of sick sinus syndrome and complete heart block. He has done well since his pacemaker was implanted.  He has recently reached ERI battery status.  He presents today for pacemaker pulse generator replacement.       DESCRIPTION OF THE PROCEDURE:  Informed written consent was obtained, and the patient was brought to the electrophysiology lab in the fasting state.  The patient's pacemaker was interrogated today and found to be at elective replacement indicator battery status.  The patient was adequately sedated with intravenous Versed as outlined in the nursing report.  The patient's left chest was prepped and draped in the usual sterile fashion by the EP lab staff.  The skin overlying the existing pacemaker was infiltrated with lidocaine for local analgesia.  A 4-cm incision was made over the pacemaker pocket.  Using a combination of sharp and blunt dissection, the pacemaker was exposed and removed from the body.  The device was disconnected from the leads. There was no foreign matter or debris within the pocket.  The atrial lead was confirmed to be a Guidant model 4460 (serial number [id number]) lead implanted on 12/02/1998.  The right ventricular lead was confirmed to be a Guidant model 4461 (serial number [id number]) lead implanted on the same date as the atrial lead (above).  Both leads were examined and their integrity was confirmed to be intact.  Atrial lead P-waves measured 4 mV with impedance of 410 ohms and a threshold of 0.5 V at 0.5 msec.  Right ventricular lead R-waves measured were absent today.  The RV lead  impedance was 410 ohms and a threshold of 0.75 V at 0.5 msec.  Both leads were connected to a [facility] [employer] model [id number] (serial number [id number]) pacemaker.  The pocket was revised to accommodate this new device.  Electrocautery was required to assure hemostasis.  The pocket was irrigated with copious gentamicin solution. The pacemaker was then placed into the pocket.  The pocket was then closed in 2 layers with 2-0 Vicryl suture over the subcutaneous and subcuticular layers.  Steri-Strips and a sterile dressing were then applied.  There were no early apparent complications.     CONCLUSIONS:   1. Successful pacemaker pulse generator replacement for elective replacement indicator battery status, symptomatic sick sinus syndrome and complete heart block  2. No early apparent complications.     Hillis Range, MD 01/01/2013 3:43 PM

## 2013-01-02 ENCOUNTER — Encounter (HOSPITAL_COMMUNITY): Payer: Self-pay | Admitting: *Deleted

## 2013-01-07 ENCOUNTER — Encounter: Payer: Self-pay | Admitting: Internal Medicine

## 2013-01-14 ENCOUNTER — Ambulatory Visit (INDEPENDENT_AMBULATORY_CARE_PROVIDER_SITE_OTHER): Payer: Medicare Other | Admitting: *Deleted

## 2013-01-14 DIAGNOSIS — I442 Atrioventricular block, complete: Secondary | ICD-10-CM

## 2013-01-14 LAB — PACEMAKER DEVICE OBSERVATION
AL THRESHOLD: 0.5 V
ATRIAL PACING PM: 52
BAMS-0003: 60 {beats}/min
BATTERY VOLTAGE: 3.1283 V
DEVICE MODEL PM: 2974994
VENTRICULAR PACING PM: 99

## 2013-01-14 NOTE — Progress Notes (Signed)
Wound check ppm in clinic. Normal device function. Battery longevity 9.3 to 9.6 years. Site well healed with no redness or swelling. ROV in 3 mths w/JA.

## 2013-01-17 DIAGNOSIS — D329 Benign neoplasm of meninges, unspecified: Secondary | ICD-10-CM | POA: Insufficient documentation

## 2013-01-25 ENCOUNTER — Encounter: Payer: Self-pay | Admitting: Internal Medicine

## 2013-01-28 ENCOUNTER — Encounter: Payer: Self-pay | Admitting: Internal Medicine

## 2013-02-27 ENCOUNTER — Ambulatory Visit (INDEPENDENT_AMBULATORY_CARE_PROVIDER_SITE_OTHER): Payer: Medicare Other | Admitting: Internal Medicine

## 2013-02-27 ENCOUNTER — Encounter: Payer: Self-pay | Admitting: Internal Medicine

## 2013-02-27 VITALS — BP 132/60 | HR 68 | Ht 70.5 in | Wt 154.0 lb

## 2013-02-27 DIAGNOSIS — K222 Esophageal obstruction: Secondary | ICD-10-CM

## 2013-02-27 DIAGNOSIS — R195 Other fecal abnormalities: Secondary | ICD-10-CM

## 2013-02-27 DIAGNOSIS — Z800 Family history of malignant neoplasm of digestive organs: Secondary | ICD-10-CM

## 2013-02-27 DIAGNOSIS — K219 Gastro-esophageal reflux disease without esophagitis: Secondary | ICD-10-CM

## 2013-02-27 NOTE — Progress Notes (Signed)
HISTORY OF PRESENT ILLNESS:  Charles Olson is a 79 y.o. male with multiple medical problems as listed below. He is sent today, as a new patient to this clinic, regarding Hemoccult-positive stool. He is accompanied by his wife. The patient has a family history of colon cancer in his father (greater than age 82). He was previously under the care of Dr. Blinda Leatherwood and has undergone multiple prior colonoscopic evaluations. Initial colonoscopy in 1992 was negative for neoplasia. Flexible sigmoidoscopy in 2000, colonoscopy in 2005, and complete colonoscopy in August of 2009 were also normal (except for internal hemorrhoids). It should be noted, at the time of his most recent colonoscopy, he had Hemoccult-positive stool. Patient also has a history of GERD complicated by peptic stricture for which she has undergone prior esophageal dilation and takes daily PPI in the form of pantoprazole 40 mg. He denies GERD symptoms or recurrent dysphagia. GI GI review of systems are entirely negative. No melena, hematochezia, change in bowel habits, or weight loss. He denies NSAID use. No history of anemia. Review of outside laboratories from the [facility] in April 2014 showed normal hemoglobin of 15.0. Hemoccult study as part of his routine annual evaluation, 01/18/2013, was positive.  REVIEW OF SYSTEMS:  All non-GI ROS negative except for vision change, hearing problems, heart rhythm change, urinary leakage  Past Medical History  Diagnosis Date  . AVB (atrioventricular block)     HIGH DEGREE; s/p PPM implantation  . Hyperlipidemia   . GERD (gastroesophageal reflux disease)   . Sinusitis   . History of orthostatic hypotension   . Syncope and collapse   . Chest pain   . Esophageal stricture   . BPH (benign prostatic hypertrophy)     Past Surgical History  Procedure Laterality Date  . Pacemaker insertion  11/1998; 12/2012    Medtronic [id number] implanted by Dr Reyes Ivan in 2008; generator change to STJ  Assurity 12/2012 by Dr Johney Frame  . Cardiac catheterization  06/2003    NORMAL CORONARIES  . Cholecystectomy      Social History Charles Olson  reports that he has never smoked. He has never used smokeless tobacco. He reports that he does not drink alcohol or use illicit drugs.  family history includes Asthma in his mother; CVA in his mother; Colon cancer in an other family member; Heart attack in his brother.  Allergies  Allergen Reactions  . Aspirin Other (See Comments)    Swells severely internally.  . Erythromycin        PHYSICAL EXAMINATION: Vital signs: BP 132/60  Pulse 68  Ht 5' 10.5" (1.791 m)  Wt 154 lb (69.854 kg)  BMI 21.78 kg/m2  Constitutional: generally well-appearing, no acute distress Psychiatric: alert and oriented x3, cooperative Eyes: extraocular movements intact, anicteric, conjunctiva pink Mouth: oral pharynx moist, no lesions Neck: supple no lymphadenopathy Cardiovascular: heart regular rate and rhythm, no murmur Lungs: clear to auscultation bilaterally Abdomen: soft, nontender, nondistended, no obvious ascites, no peritoneal signs, normal bowel sounds, no organomegaly Rectal: Omitted. Extremities: no lower extremity edema bilaterally Skin: no lesions on visible extremities Neuro: No focal deficits.    ASSESSMENT:  #1. Hemoccult-positive stool. Asymptomatic. Normal hemoglobin #2. Family history of colon cancer in his father (greater than age 43). Given father is age, risk of colon cancers about that of the population at large #3. Multiple prior colonoscopies as documented. No history of polyps. Last exam 5 years ago #4. GERD complicated by peptic stricture. Currently asymptomatic post dilation on  PPI   PLAN:  #1. The clinical significance of his Hemoccult-positive stool is questionable. He has been evaluated for Hemoccult positive stool previously via colonoscopy without revealation. Though I cannot rule out significant GI pathology, it seems less  likely. We discussed in great detail the pros and cons of proceeding with colonoscopy. By did not feel strong about it, but felt it would be reasonable given his overall stable health and high functional status. However, the patient and his wife have elected for expectant management and would prefer to forego any endoscopic evaluations unless more significant clinical issues were to develop such as obvious bleeding, the development of anemia, or other relevant symptoms... I support their decision... #2. Continue PPI for GERD and a history of peptic stricture. As well upper GI mucosal protection #3. Resume general medical care with Dr. Eloise Harman

## 2013-02-27 NOTE — Patient Instructions (Signed)
Please follow up with Dr. Perry as needed 

## 2013-03-09 ENCOUNTER — Encounter: Payer: Self-pay | Admitting: Gastroenterology

## 2013-03-25 ENCOUNTER — Encounter: Payer: Self-pay | Admitting: Cardiology

## 2013-03-25 ENCOUNTER — Ambulatory Visit (INDEPENDENT_AMBULATORY_CARE_PROVIDER_SITE_OTHER): Payer: Medicare Other | Admitting: Cardiology

## 2013-03-25 VITALS — BP 152/70 | HR 64 | Ht 70.5 in | Wt 152.8 lb

## 2013-03-25 DIAGNOSIS — E785 Hyperlipidemia, unspecified: Secondary | ICD-10-CM

## 2013-03-25 DIAGNOSIS — E7800 Pure hypercholesterolemia, unspecified: Secondary | ICD-10-CM

## 2013-03-25 DIAGNOSIS — I442 Atrioventricular block, complete: Secondary | ICD-10-CM

## 2013-03-25 DIAGNOSIS — I10 Essential (primary) hypertension: Secondary | ICD-10-CM

## 2013-03-25 DIAGNOSIS — I4430 Unspecified atrioventricular block: Secondary | ICD-10-CM

## 2013-03-25 NOTE — Assessment & Plan Note (Signed)
His lipids are followed by Dr. Jarome Matin.  His simvastatin had been cut back to a lower dose to see if it would help his memory but so far he has not noted any improvement

## 2013-03-25 NOTE — Assessment & Plan Note (Signed)
He has not been having any symptoms of dizziness or syncope.

## 2013-03-25 NOTE — Patient Instructions (Signed)
Your physician recommends that you continue on your current medications as directed. Please refer to the Current Medication list given to you today.  Your physician wants you to follow-up in: 6 month ov/ekg You will receive a reminder letter in the mail two months in advance. If you don't receive a letter, please call our office to schedule the follow-up appointment.  

## 2013-03-25 NOTE — Progress Notes (Signed)
Charles Olson Date of Birth:  1930/03/03 [address] [place], [place]  [address] [phone]  Fax   [phone]  HPI: This pleasant 79 year old gentleman is seen for a six-month followup office visit. He has a past history of hypertension and a history of hypercholesterolemia. He also has a history of idiopathic AV block and had a permanent pacemaker initially implanted about 10 years ago.  He had a generator replacement in August 2014. He has been doing well. Last year he had successful cataract surgery on both eyes by Dr. Burgess Estelle.   Current Outpatient Prescriptions  Medication Sig Dispense Refill  . Cholecalciferol (VITAMIN D) 1000 UNITS capsule Take 1,000 Units by mouth daily.       . flunisolide (NASAREL) 29 MCG/ACT (0.025%) nasal spray Place 1 spray into the nose daily.      Marland Kitchen Lysine 500 MG TABS Take 500 mg by mouth daily.       . Multiple Vitamin (MULTIVITAMIN) tablet Take 1 tablet by mouth daily.       . pantoprazole (PROTONIX) 40 MG tablet Take 40 mg by mouth daily.       . simvastatin (ZOCOR) 80 MG tablet Take 40 mg by mouth daily.       No current facility-administered medications for this visit.    Allergies  Allergen Reactions  . Aspirin Other (See Comments)    Swells severely internally.  . Erythromycin     Patient Active Problem List   Diagnosis Date Noted  . Essential hypertension, benign 07/15/2010    Priority: High  . HYPERLIPIDEMIA 07/14/2010    Priority: High  . Complete heart block 07/14/2010    Priority: Medium  . GERD 07/14/2010  . PACEMAKER, PERMANENT 07/14/2010    History  Smoking status  . Never Smoker   Smokeless tobacco  . Never Used    History  Alcohol Use No    Family History  Problem Relation Age of Onset  . Heart attack Brother   . CVA Mother   . Asthma Mother   . Colon cancer      ?    Review of Systems: The patient denies any heat or cold intolerance.  No weight gain or weight loss.  The patient  denies headaches or blurry vision.  There is no cough or sputum production.  The patient denies dizziness.  There is no hematuria or hematochezia.  The patient denies any muscle aches or arthritis.  The patient denies any rash.  The patient denies frequent falling or instability.  There is no history of depression or anxiety.  All other systems were reviewed and are negative.   Physical Exam: Filed Vitals:   03/25/13 0829  BP: 152/70  Pulse: 64   the general appearance reveals a well-developed well-nourished gentleman in no distress.The head and neck exam reveals pupils equal and reactive.  Extraocular movements are full.  There is no scleral icterus.  The mouth and pharynx are normal.  The neck is supple.  The carotids reveal no bruits.  The jugular venous pressure is normal.  The  thyroid is not enlarged.  There is no lymphadenopathy.  The chest is clear to percussion and auscultation.  There are no rales or rhonchi.  Expansion of the chest is symmetrical.  The precordium is quiet.  The first heart sound is normal.  The second heart sound is physiologically split.  There is no murmur gallop rub or click.  There is no abnormal lift or heave.  The abdomen is soft and nontender.  The bowel sounds are normal.  The liver and spleen are not enlarged.  There are no abdominal masses.  There are no abdominal bruits.  Extremities reveal good pedal pulses.  There is no phlebitis or edema.  There is no cyanosis or clubbing.  Strength is normal and symmetrical in all extremities.  There is no lateralizing weakness.  There are no sensory deficits.  The skin is warm and dry.  There is no rash.      Assessment / Plan: Continue same medication.  Recheck in 6 months for office visit and EKG.  He stays physically active.  He heats his house with wood.

## 2013-03-25 NOTE — Assessment & Plan Note (Signed)
Blood pressure here in the office is elevated.  However, he brought in a list of his blood pressures at home which are quite good.  He does have known white coat syndrome.

## 2013-03-28 ENCOUNTER — Other Ambulatory Visit: Payer: Self-pay

## 2013-04-17 ENCOUNTER — Encounter: Payer: Self-pay | Admitting: Internal Medicine

## 2013-04-17 ENCOUNTER — Ambulatory Visit (INDEPENDENT_AMBULATORY_CARE_PROVIDER_SITE_OTHER): Payer: Medicare Other | Admitting: Internal Medicine

## 2013-04-17 VITALS — BP 150/71 | HR 64 | Ht 70.0 in | Wt 153.0 lb

## 2013-04-17 DIAGNOSIS — Z950 Presence of cardiac pacemaker: Secondary | ICD-10-CM

## 2013-04-17 DIAGNOSIS — I10 Essential (primary) hypertension: Secondary | ICD-10-CM

## 2013-04-17 DIAGNOSIS — I442 Atrioventricular block, complete: Secondary | ICD-10-CM

## 2013-04-17 LAB — MDC_IDC_ENUM_SESS_TYPE_INCLINIC
Battery Voltage: 3.02 V
Date Time Interrogation Session: 20141126102005
Implantable Pulse Generator Model: 2240
Lead Channel Pacing Threshold Amplitude: 0.5 V
Lead Channel Pacing Threshold Amplitude: 0.75 V
Lead Channel Pacing Threshold Pulse Width: 0.4 ms
Lead Channel Sensing Intrinsic Amplitude: 3.5 mV
Lead Channel Setting Pacing Pulse Width: 0.4 ms

## 2013-04-17 NOTE — Progress Notes (Signed)
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 male who presents today for routine electrophysiology followup.  Since last being seen in our clinic, the patient reports doing very well.  Today, he denies symptoms of palpitations, chest pain, shortness of breath,  lower extremity edema, dizziness, presyncope, or syncope.  The patient is otherwise without complaint today.   Past Medical History  Diagnosis Date  . AVB (atrioventricular block)     HIGH DEGREE; s/p PPM implantation  . Hyperlipidemia   . GERD (gastroesophageal reflux disease)   . Sinusitis   . History of orthostatic hypotension   . Syncope and collapse   . Chest pain   . Esophageal stricture   . BPH (benign prostatic hypertrophy)    Past Surgical History  Procedure Laterality Date  . Pacemaker insertion  11/1998; 12/2012    Medtronic [id number] implanted by Dr Reyes Ivan in 2008; generator change to STJ Assurity 12/2012 by Dr Johney Frame  . Cardiac catheterization  06/2003    NORMAL CORONARIES  . Cholecystectomy      Current Outpatient Prescriptions  Medication Sig Dispense Refill  . Cholecalciferol (VITAMIN D) 1000 UNITS capsule Take 1,000 Units by mouth daily.       . flunisolide (NASAREL) 29 MCG/ACT (0.025%) nasal spray Place 1 spray into the nose daily.      Marland Kitchen Lysine 500 MG TABS Take 500 mg by mouth daily.       . Multiple Vitamin (MULTIVITAMIN) tablet Take 1 tablet by mouth daily.       . pantoprazole (PROTONIX) 40 MG tablet Take 40 mg by mouth daily.       . simvastatin (ZOCOR) 80 MG tablet Take 40 mg by mouth daily.       No current facility-administered medications for this visit.    Physical Exam: Filed Vitals:   04/17/13 0946  BP: 150/71  Pulse: 64  Height: 5\' 10"  (1.778 m)  Weight: 153 lb (69.4 kg)    GEN- The patient is well appearing, alert and oriented x 3 today.   Head- normocephalic, atraumatic Eyes-  Sclera clear, conjunctiva pink Ears- hearing  intact Oropharynx- clear Lungs- Clear to ausculation bilaterally, normal work of breathing Chest- R sided pacemaker pocket is well healed Heart- Regular rate and rhythm, no murmurs, rubs or gallops, PMI not laterally displaced GI- soft, NT, ND, + BS Extremities- no clubbing, cyanosis, or edema  Pacemaker interrogation- reviewed in detail today,  See PACEART report  Assessment and Plan:  1. Complete heart block Normal pacemaker function See Pace Art report No changes today  2. HTN Reports good control at home  [place] Return in 1 year

## 2013-04-17 NOTE — Patient Instructions (Signed)
Your physician wants you to follow-up in: 12/2013 with Dr Johney Frame Bonita Quin will receive a reminder letter in the mail two months in advance. If you don't receive a letter, please call our office to schedule the follow-up appointment.   Remote monitoring is used to monitor your Pacemaker or ICD from home. This monitoring reduces the number of office visits required to check your device to one time per year. It allows [place] to keep an eye on the functioning of your device to ensure it is working properly. You are scheduled for a device check from home on 07/19/13. You may send your transmission at any time that day. If you have a wireless device, the transmission will be sent automatically. After your physician reviews your transmission, you will receive a postcard with your next transmission date.

## 2013-07-19 ENCOUNTER — Ambulatory Visit (INDEPENDENT_AMBULATORY_CARE_PROVIDER_SITE_OTHER): Payer: Medicare HMO | Admitting: *Deleted

## 2013-07-19 DIAGNOSIS — I442 Atrioventricular block, complete: Secondary | ICD-10-CM

## 2013-07-22 LAB — MDC_IDC_ENUM_SESS_TYPE_REMOTE
Battery Remaining Longevity: 110 mo
Battery Voltage: 3.02 V
Implantable Pulse Generator Model: 2240
Lead Channel Impedance Value: 390 Ohm
Lead Channel Pacing Threshold Amplitude: 0.75 V
Lead Channel Pacing Threshold Pulse Width: 0.4 ms
Lead Channel Pacing Threshold Pulse Width: 0.4 ms
Lead Channel Setting Pacing Amplitude: 2 V
Lead Channel Setting Pacing Pulse Width: 0.4 ms
MDC IDC MSMT LEADCHNL RA PACING THRESHOLD AMPLITUDE: 0.5 V
MDC IDC MSMT LEADCHNL RA SENSING INTR AMPL: 3.9 mV
MDC IDC MSMT LEADCHNL RV IMPEDANCE VALUE: 410 Ohm
MDC IDC MSMT LEADCHNL RV SENSING INTR AMPL: 12 mV
MDC IDC PG SERIAL: 2974994
MDC IDC SESS DTM: 20150227074723
MDC IDC SET LEADCHNL RV PACING AMPLITUDE: 2.5 V
MDC IDC SET LEADCHNL RV SENSING SENSITIVITY: 4 mV
MDC IDC STAT BRADY AP VP PERCENT: 39 %
MDC IDC STAT BRADY AP VS PERCENT: 1 %
MDC IDC STAT BRADY AS VP PERCENT: 61 %
MDC IDC STAT BRADY AS VS PERCENT: 1 %
MDC IDC STAT BRADY RA PERCENT PACED: 39 %
MDC IDC STAT BRADY RV PERCENT PACED: 99 %

## 2013-07-23 ENCOUNTER — Telehealth: Payer: Self-pay | Admitting: Internal Medicine

## 2013-07-23 NOTE — Telephone Encounter (Signed)
New message          Pt wife would like to know how to send a transmission

## 2013-07-23 NOTE — Telephone Encounter (Signed)
Spoke w/wife---answered questions abut transmitter.

## 2013-07-30 ENCOUNTER — Encounter: Payer: Self-pay | Admitting: *Deleted

## 2013-08-06 ENCOUNTER — Encounter: Payer: Self-pay | Admitting: Internal Medicine

## 2013-09-26 ENCOUNTER — Encounter: Payer: Self-pay | Admitting: Cardiology

## 2013-09-26 ENCOUNTER — Ambulatory Visit (INDEPENDENT_AMBULATORY_CARE_PROVIDER_SITE_OTHER): Payer: Medicare HMO | Admitting: Cardiology

## 2013-09-26 VITALS — BP 124/60 | HR 55 | Ht 70.0 in | Wt 154.0 lb

## 2013-09-26 DIAGNOSIS — I10 Essential (primary) hypertension: Secondary | ICD-10-CM

## 2013-09-26 DIAGNOSIS — E785 Hyperlipidemia, unspecified: Secondary | ICD-10-CM

## 2013-09-26 DIAGNOSIS — E7800 Pure hypercholesterolemia, unspecified: Secondary | ICD-10-CM

## 2013-09-26 DIAGNOSIS — I442 Atrioventricular block, complete: Secondary | ICD-10-CM

## 2013-09-26 NOTE — Assessment & Plan Note (Signed)
The patient has had no symptoms of dizziness or syncope since pacemaker implant

## 2013-09-26 NOTE — Progress Notes (Signed)
Charles Olson Date of Birth:  03-24-1930 [place], [place]  [address] [phone]  Fax   [phone]  HPI: This pleasant 80 year old gentleman is seen for a six-month followup office visit. He has a past history of hypertension and a history of hypercholesterolemia. He also has a history of idiopathic AV block and had a permanent pacemaker initially implanted about 10 years ago. He had a generator replacement in August 2014. He has been doing well.  Last year he had successful cataract surgery on both eyes by Dr. Satira Sark. He continues to feel very well.  He lives on [place] acres of land which keeps him busy.  He stays physically very active.  Current Outpatient Prescriptions  Medication Sig Dispense Refill  . Cholecalciferol (VITAMIN D) 1000 UNITS capsule Take 1,000 Units by mouth daily.       . flunisolide (NASAREL) 29 MCG/ACT (0.025%) nasal spray Place 1 spray into the nose daily.      Marland Kitchen Lysine 500 MG TABS Take 500 mg by mouth daily.       . Multiple Vitamin (MULTIVITAMIN) tablet Take 1 tablet by mouth daily.       . pantoprazole (PROTONIX) 40 MG tablet Take 40 mg by mouth daily.       . simvastatin (ZOCOR) 80 MG tablet Take 40 mg by mouth daily.       No current facility-administered medications for this visit.    Allergies  Allergen Reactions  . Aspirin Anaphylaxis and Other (See Comments)    Swells severely internally.  . Erythromycin     Patient Active Problem List   Diagnosis Date Noted  . Essential hypertension, benign 07/15/2010    Priority: High  . HYPERLIPIDEMIA 07/14/2010    Priority: High  . Complete heart block 07/14/2010    Priority: Medium  . GERD 07/14/2010  . PACEMAKER, PERMANENT 07/14/2010    History  Smoking status  . Never Smoker   Smokeless tobacco  . Never Used    History  Alcohol Use No    Family History  Problem Relation Age of Onset  . Heart attack Brother   . CVA Mother   . Asthma  Mother   . Colon cancer      ?    Review of Systems: The patient denies any heat or cold intolerance.  No weight gain or weight loss.  The patient denies headaches or blurry vision.  There is no cough or sputum production.  The patient denies dizziness.  There is no hematuria or hematochezia.  The patient denies any muscle aches or arthritis.  The patient denies any rash.  The patient denies frequent falling or instability.  There is no history of depression or anxiety.  All other systems were reviewed and are negative.   Physical Exam: Filed Vitals:   09/26/13 0820  BP: 124/60  Pulse: 55   the general appearance reveals a well developed well-nourished gentleman in no distress.The head and neck exam reveals pupils equal and reactive.  Extraocular movements are full.  There is no scleral icterus.  The mouth and pharynx are normal.  The neck is supple.  The carotids reveal no bruits.  The jugular venous pressure is normal.  The  thyroid is not enlarged.  There is no lymphadenopathy.  The chest is clear to percussion and auscultation.  There are no rales or rhonchi.  Expansion of the chest is symmetrical.  The precordium is quiet.  The first heart sound is normal.  The second heart sound is physiologically split.  There is no murmur gallop rub or click.  There is no abnormal lift or heave.  The abdomen is soft and nontender.  The bowel sounds are normal.  The liver and spleen are not enlarged.  There are no abdominal masses.  There are no abdominal bruits.  Extremities reveal good pedal pulses.  There is no phlebitis or edema.  There is no cyanosis or clubbing.  Strength is normal and symmetrical in all extremities.  There is no lateralizing weakness.  There are no sensory deficits.  The skin is warm and dry.  There is no rash.  EKG shows AV paced rhythm    Assessment / Plan:  1. complete heart block 2. Hyperlipidemia 3. essential hypertension  Plan: Continue current regimen.  Continue full  activity.  Recheck 6 months

## 2013-09-26 NOTE — Patient Instructions (Signed)
Your physician recommends that you continue on your current medications as directed. Please refer to the Current Medication list given to you today.  Your physician wants you to follow-up in: 6 month ov You will receive a reminder letter in the mail two months in advance. If you don't receive a letter, please call our office to schedule the follow-up appointment.  

## 2013-09-26 NOTE — Assessment & Plan Note (Signed)
Blood pressure was remaining stable on current therapy.  He watches his dietary salt.  He is not on any blood pressure medication.

## 2013-09-26 NOTE — Assessment & Plan Note (Signed)
The patient is on simvastatin 80 mg daily.  He is not having any myalgias.  His lipids are monitored by his PCP

## 2013-10-22 ENCOUNTER — Ambulatory Visit (INDEPENDENT_AMBULATORY_CARE_PROVIDER_SITE_OTHER): Payer: Medicare HMO | Admitting: *Deleted

## 2013-10-22 DIAGNOSIS — I442 Atrioventricular block, complete: Secondary | ICD-10-CM

## 2013-10-22 NOTE — Progress Notes (Signed)
Remote pacemaker transmission.   

## 2013-10-24 LAB — MDC_IDC_ENUM_SESS_TYPE_REMOTE
Battery Remaining Longevity: 109 mo
Battery Remaining Percentage: 95.5 %
Brady Statistic AS VS Percent: 1 %
Brady Statistic RA Percent Paced: 41 %
Brady Statistic RV Percent Paced: 99 %
Date Time Interrogation Session: 20150602062944
Implantable Pulse Generator Model: 2240
Lead Channel Impedance Value: 400 Ohm
Lead Channel Pacing Threshold Amplitude: 0.5 V
Lead Channel Pacing Threshold Amplitude: 0.75 V
Lead Channel Pacing Threshold Pulse Width: 0.4 ms
MDC IDC MSMT BATTERY VOLTAGE: 3.01 V
MDC IDC MSMT LEADCHNL RA SENSING INTR AMPL: 4.2 mV
MDC IDC MSMT LEADCHNL RV IMPEDANCE VALUE: 430 Ohm
MDC IDC MSMT LEADCHNL RV PACING THRESHOLD PULSEWIDTH: 0.4 ms
MDC IDC MSMT LEADCHNL RV SENSING INTR AMPL: 10.5 mV
MDC IDC PG SERIAL: 2974994
MDC IDC SET LEADCHNL RA PACING AMPLITUDE: 2 V
MDC IDC SET LEADCHNL RV PACING AMPLITUDE: 2.5 V
MDC IDC SET LEADCHNL RV PACING PULSEWIDTH: 0.4 ms
MDC IDC SET LEADCHNL RV SENSING SENSITIVITY: 4 mV
MDC IDC STAT BRADY AP VP PERCENT: 41 %
MDC IDC STAT BRADY AP VS PERCENT: 1 %
MDC IDC STAT BRADY AS VP PERCENT: 58 %

## 2013-11-01 ENCOUNTER — Encounter: Payer: Self-pay | Admitting: Cardiology

## 2013-11-06 ENCOUNTER — Encounter: Payer: Self-pay | Admitting: Internal Medicine

## 2014-01-08 ENCOUNTER — Encounter: Payer: Self-pay | Admitting: Internal Medicine

## 2014-01-08 ENCOUNTER — Ambulatory Visit (INDEPENDENT_AMBULATORY_CARE_PROVIDER_SITE_OTHER): Payer: Medicare HMO | Admitting: Internal Medicine

## 2014-01-08 VITALS — BP 153/85 | HR 55 | Ht 70.0 in | Wt 154.0 lb

## 2014-01-08 DIAGNOSIS — I442 Atrioventricular block, complete: Secondary | ICD-10-CM

## 2014-01-08 DIAGNOSIS — Z950 Presence of cardiac pacemaker: Secondary | ICD-10-CM

## 2014-01-08 LAB — MDC_IDC_ENUM_SESS_TYPE_INCLINIC
Battery Remaining Longevity: 104.4 mo
Battery Voltage: 3.01 V
Brady Statistic RA Percent Paced: 40 %
Date Time Interrogation Session: 20150819102225
Lead Channel Impedance Value: 412.5 Ohm
Lead Channel Pacing Threshold Amplitude: 0.5 V
Lead Channel Pacing Threshold Amplitude: 0.5 V
Lead Channel Pacing Threshold Pulse Width: 0.4 ms
Lead Channel Pacing Threshold Pulse Width: 0.4 ms
Lead Channel Sensing Intrinsic Amplitude: 12 mV
Lead Channel Setting Sensing Sensitivity: 4 mV
MDC IDC MSMT LEADCHNL RA PACING THRESHOLD PULSEWIDTH: 0.4 ms
MDC IDC MSMT LEADCHNL RA SENSING INTR AMPL: 4.1 mV
MDC IDC MSMT LEADCHNL RV IMPEDANCE VALUE: 450 Ohm
MDC IDC MSMT LEADCHNL RV PACING THRESHOLD AMPLITUDE: 0.75 V
MDC IDC MSMT LEADCHNL RV PACING THRESHOLD AMPLITUDE: 0.75 V
MDC IDC MSMT LEADCHNL RV PACING THRESHOLD PULSEWIDTH: 0.4 ms
MDC IDC PG SERIAL: 2974994
MDC IDC SET LEADCHNL RA PACING AMPLITUDE: 2 V
MDC IDC SET LEADCHNL RV PACING AMPLITUDE: 2.5 V
MDC IDC SET LEADCHNL RV PACING PULSEWIDTH: 0.4 ms
MDC IDC STAT BRADY RV PERCENT PACED: 99.78 %

## 2014-01-08 NOTE — Patient Instructions (Signed)
Your physician recommends that you continue on your current medications as directed. Please refer to the Current Medication list given to you today.  Your physician wants you to follow-up in: [place] DR. ALLRED. You will receive a reminder letter in the mail two months in advance. If you don't receive a letter, please call our office to schedule the follow-up appointment.  Remote monitoring is used to monitor your Pacemaker of ICD from home. This monitoring reduces the number of office visits required to check your device to one time per year. It allows [place] to keep an eye on the functioning of your device to ensure it is working properly. You are scheduled for a device check from home on 04/09/2014. You may send your transmission at any time that day. If you have a wireless device, the transmission will be sent automatically. After your physician reviews your transmission, you will receive a postcard with your next transmission date.

## 2014-01-08 NOTE — Progress Notes (Signed)
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  Since last being seen in our clinic, the patient reports doing very well.  Today, he denies symptoms of palpitations, chest pain, shortness of breath,  lower extremity edema, dizziness, presyncope, or syncope.  The patient is otherwise without complaint today.   Past Medical History  Diagnosis Date  . AVB (atrioventricular block)     HIGH DEGREE; s/p PPM implantation  . Hyperlipidemia   . GERD (gastroesophageal reflux disease)   . Sinusitis   . History of orthostatic hypotension   . Syncope and collapse   . Chest pain   . Esophageal stricture   . BPH (benign prostatic hypertrophy)    Past Surgical History  Procedure Laterality Date  . Pacemaker insertion  11/1998; 12/2012    Medtronic [id number] implanted by Dr Verlon Setting in 2008; generator change to STJ Assurity 12/2012 by Dr Rayann Heman  . Cardiac catheterization  06/2003    NORMAL CORONARIES  . Cholecystectomy      Current Outpatient Prescriptions  Medication Sig Dispense Refill  . Cholecalciferol (VITAMIN D) 1000 UNITS capsule Take 1,000 Units by mouth daily.       . flunisolide (NASAREL) 29 MCG/ACT (0.025%) nasal spray Place 1 spray into the nose daily.      Marland Kitchen Lysine 500 MG TABS Take 500 mg by mouth daily.       . Multiple Vitamin (MULTIVITAMIN) tablet Take 1 tablet by mouth daily.       . pantoprazole (PROTONIX) 40 MG tablet Take 40 mg by mouth daily.       . simvastatin (ZOCOR) 80 MG tablet Take 40 mg by mouth daily.       No current facility-administered medications for this visit.    Physical Exam: Filed Vitals:   01/08/14 0943  BP: 153/85  Pulse: 55  Height: 5\' 10"  (1.778 m)  Weight: 154 lb (69.854 kg)    GEN- The patient is well appearing, alert and oriented x 3 today.   Head- normocephalic, atraumatic Eyes-  Sclera clear, conjunctiva pink Ears- hearing  intact Oropharynx- clear Lungs- Clear to ausculation bilaterally, normal work of breathing Chest- R sided pacemaker pocket is well healed Heart- Regular rate and rhythm, no murmurs, rubs or gallops, PMI not laterally displaced GI- soft, NT, ND, + BS Extremities- no clubbing, cyanosis, or edema  Pacemaker interrogation- reviewed in detail today,  See PACEART report ekg today reveals AV pacing  Assessment and Plan:  1. Complete heart block Normal pacemaker function See Pace Art report No changes today  2. HTN Reports good control at home  [place] Return in 1 year

## 2014-01-15 ENCOUNTER — Encounter: Payer: Self-pay | Admitting: Internal Medicine

## 2014-03-26 ENCOUNTER — Encounter: Payer: Self-pay | Admitting: Cardiology

## 2014-03-26 ENCOUNTER — Ambulatory Visit (INDEPENDENT_AMBULATORY_CARE_PROVIDER_SITE_OTHER): Payer: Medicare HMO | Admitting: Cardiology

## 2014-03-26 VITALS — BP 138/60 | HR 57 | Ht 70.0 in | Wt 156.0 lb

## 2014-03-26 DIAGNOSIS — E7800 Pure hypercholesterolemia, unspecified: Secondary | ICD-10-CM

## 2014-03-26 DIAGNOSIS — I442 Atrioventricular block, complete: Secondary | ICD-10-CM

## 2014-03-26 DIAGNOSIS — E785 Hyperlipidemia, unspecified: Secondary | ICD-10-CM

## 2014-03-26 DIAGNOSIS — I10 Essential (primary) hypertension: Secondary | ICD-10-CM

## 2014-03-26 NOTE — Patient Instructions (Signed)
Your physician recommends that you continue on your current medications as directed. Please refer to the Current Medication list given to you today.  Your physician wants you to follow-up in: 6 mnth ov You will receive a reminder letter in the mail two months in advance. If you don't receive a letter, please call our office to schedule the follow-up appointment.

## 2014-03-26 NOTE — Assessment & Plan Note (Signed)
The patient is not having any chest pain or shortness of breath.  No dizziness or syncope.

## 2014-03-26 NOTE — Progress Notes (Signed)
Charles Olson Date of Birth:  1929-12-20 [address] [place], [place]  [address] [phone]  Fax   [phone]  HPI: This pleasant 80 year old gentleman is seen for a six-month followup office visit. He has a past history of hypertension and a history of hypercholesterolemia. He also has a history of idiopathic AV block and had a permanent pacemaker initially implanted about 10 years ago. He had a generator replacement in August 2014. He has been doing well.  Last year he had successful cataract surgery on both eyes by Dr. Satira Sark. He continues to feel very well.  He lives on 8 acres of land which keeps him busy.  He stays physically very active.  Current Outpatient Prescriptions  Medication Sig Dispense Refill  . Cholecalciferol (VITAMIN D) 1000 UNITS capsule Take 1,000 Units by mouth daily.     . flunisolide (NASAREL) 29 MCG/ACT (0.025%) nasal spray Place 1 spray into the nose daily.    Marland Kitchen Lysine 500 MG TABS Take 500 mg by mouth daily.     . Multiple Vitamin (MULTIVITAMIN) tablet Take 1 tablet by mouth daily.     . pantoprazole (PROTONIX) 40 MG tablet Take 40 mg by mouth daily.     . simvastatin (ZOCOR) 80 MG tablet Take 40 mg by mouth daily.     No current facility-administered medications for this visit.    Allergies  Allergen Reactions  . Aspirin Anaphylaxis and Other (See Comments)    Swells severely internally.  . Erythromycin     Patient Active Problem List   Diagnosis Date Noted  . Essential hypertension, benign 07/15/2010    Priority: High  . HYPERLIPIDEMIA 07/14/2010    Priority: High  . Complete heart block 07/14/2010    Priority: Medium  . GERD 07/14/2010  . PACEMAKER, PERMANENT 07/14/2010    History  Smoking status  . Never Smoker   Smokeless tobacco  . Never Used    History  Alcohol Use No    Family History  Problem Relation Age of Onset  . Heart attack Brother   . CVA Mother   . Asthma Mother   . Colon  cancer      ?    Review of Systems: The patient denies any heat or cold intolerance.  No weight gain or weight loss.  The patient denies headaches or blurry vision.  There is no cough or sputum production.  The patient denies dizziness.  There is no hematuria or hematochezia.  The patient denies any muscle aches or arthritis.  The patient denies any rash.  The patient denies frequent falling or instability.  There is no history of depression or anxiety.  All other systems were reviewed and are negative.   Physical Exam: Filed Vitals:   03/26/14 1146  BP: 138/60  Pulse: 57   the general appearance reveals a well developed well-nourished gentleman in no distress.The head and neck exam reveals pupils equal and reactive.  Extraocular movements are full.  There is no scleral icterus.  The mouth and pharynx are normal.  The neck is supple.  The carotids reveal no bruits.  The jugular venous pressure is normal.  The  thyroid is not enlarged.  There is no lymphadenopathy.  The chest is clear to percussion and auscultation.  There are no rales or rhonchi.  Expansion of the chest is symmetrical.  The precordium is quiet.  The first heart sound is normal.  The second heart sound is  physiologically split.  There is no murmur gallop rub or click.  There is no abnormal lift or heave.  The abdomen is soft and nontender.  The bowel sounds are normal.  The liver and spleen are not enlarged.  There are no abdominal masses.  There are no abdominal bruits.  Extremities reveal good pedal pulses.  There is no phlebitis or edema.  There is no cyanosis or clubbing.  Strength is normal and symmetrical in all extremities.  There is no lateralizing weakness.  There are no sensory deficits.  The skin is warm and dry.  There is no rash.      Assessment / Plan:  1. complete heart block 2. Hyperlipidemia 3. essential hypertension  Plan: Continue current regimen.  Continue full activity.  Recheck 6 months

## 2014-03-26 NOTE — Assessment & Plan Note (Signed)
The patient has a history of dyslipidemia.  He is on simvastatin.  He is tolerating it well without any myalgias.  His lipid panels are followed by his PCP Dr. Philip Aspen

## 2014-04-09 ENCOUNTER — Ambulatory Visit (INDEPENDENT_AMBULATORY_CARE_PROVIDER_SITE_OTHER): Payer: Medicare HMO | Admitting: *Deleted

## 2014-04-09 DIAGNOSIS — I442 Atrioventricular block, complete: Secondary | ICD-10-CM

## 2014-04-09 LAB — MDC_IDC_ENUM_SESS_TYPE_REMOTE
Brady Statistic AP VP Percent: 43 %
Brady Statistic AS VP Percent: 57 %
Brady Statistic AS VS Percent: 1 %
Brady Statistic RV Percent Paced: 99 %
Implantable Pulse Generator Serial Number: 2974994
Lead Channel Impedance Value: 390 Ohm
Lead Channel Pacing Threshold Amplitude: 0.75 V
Lead Channel Pacing Threshold Pulse Width: 0.4 ms
Lead Channel Pacing Threshold Pulse Width: 0.4 ms
Lead Channel Sensing Intrinsic Amplitude: 7.3 mV
Lead Channel Setting Pacing Amplitude: 2 V
Lead Channel Setting Pacing Pulse Width: 0.4 ms
Lead Channel Setting Sensing Sensitivity: 4 mV
MDC IDC MSMT BATTERY REMAINING LONGEVITY: 111 mo
MDC IDC MSMT BATTERY REMAINING PERCENTAGE: 95.5 %
MDC IDC MSMT BATTERY VOLTAGE: 3.01 V
MDC IDC MSMT LEADCHNL RA IMPEDANCE VALUE: 380 Ohm
MDC IDC MSMT LEADCHNL RA PACING THRESHOLD AMPLITUDE: 0.5 V
MDC IDC MSMT LEADCHNL RA SENSING INTR AMPL: 3.2 mV
MDC IDC SESS DTM: 20151118070015
MDC IDC SET LEADCHNL RV PACING AMPLITUDE: 2.5 V
MDC IDC STAT BRADY AP VS PERCENT: 1 %
MDC IDC STAT BRADY RA PERCENT PACED: 43 %

## 2014-04-09 NOTE — Progress Notes (Signed)
Remote pacemaker transmission.   

## 2014-04-23 ENCOUNTER — Encounter: Payer: Self-pay | Admitting: Cardiology

## 2014-04-28 ENCOUNTER — Encounter: Payer: Self-pay | Admitting: Internal Medicine

## 2014-05-01 ENCOUNTER — Encounter (HOSPITAL_COMMUNITY): Payer: Self-pay | Admitting: Internal Medicine

## 2014-07-10 ENCOUNTER — Ambulatory Visit (INDEPENDENT_AMBULATORY_CARE_PROVIDER_SITE_OTHER): Payer: Medicare HMO | Admitting: *Deleted

## 2014-07-10 DIAGNOSIS — I442 Atrioventricular block, complete: Secondary | ICD-10-CM

## 2014-07-10 LAB — MDC_IDC_ENUM_SESS_TYPE_REMOTE
Battery Remaining Longevity: 110 mo
Battery Voltage: 3.01 V
Brady Statistic RA Percent Paced: 39 %
Implantable Pulse Generator Model: 2240
Lead Channel Impedance Value: 400 Ohm
Lead Channel Pacing Threshold Amplitude: 0.75 V
Lead Channel Pacing Threshold Pulse Width: 0.4 ms
Lead Channel Sensing Intrinsic Amplitude: 12 mV
Lead Channel Setting Pacing Amplitude: 2 V
Lead Channel Setting Pacing Amplitude: 2.5 V
Lead Channel Setting Sensing Sensitivity: 4 mV
MDC IDC MSMT BATTERY REMAINING PERCENTAGE: 95.5 %
MDC IDC MSMT LEADCHNL RA IMPEDANCE VALUE: 360 Ohm
MDC IDC MSMT LEADCHNL RA PACING THRESHOLD AMPLITUDE: 0.5 V
MDC IDC MSMT LEADCHNL RA SENSING INTR AMPL: 3.7 mV
MDC IDC MSMT LEADCHNL RV PACING THRESHOLD PULSEWIDTH: 0.4 ms
MDC IDC PG SERIAL: 2974994
MDC IDC SESS DTM: 20160218090032
MDC IDC SET LEADCHNL RV PACING PULSEWIDTH: 0.4 ms
MDC IDC STAT BRADY AP VP PERCENT: 40 %
MDC IDC STAT BRADY AP VS PERCENT: 1 %
MDC IDC STAT BRADY AS VP PERCENT: 60 %
MDC IDC STAT BRADY AS VS PERCENT: 1 %
MDC IDC STAT BRADY RV PERCENT PACED: 99 %

## 2014-07-10 NOTE — Progress Notes (Signed)
Remote pacemaker transmission.   

## 2014-07-21 ENCOUNTER — Encounter: Payer: Self-pay | Admitting: Cardiology

## 2014-07-28 ENCOUNTER — Encounter: Payer: Self-pay | Admitting: Internal Medicine

## 2014-09-23 ENCOUNTER — Ambulatory Visit (INDEPENDENT_AMBULATORY_CARE_PROVIDER_SITE_OTHER): Payer: Medicare HMO | Admitting: Cardiology

## 2014-09-23 ENCOUNTER — Encounter: Payer: Self-pay | Admitting: Cardiology

## 2014-09-23 VITALS — BP 142/64 | HR 57 | Ht 70.0 in | Wt 150.1 lb

## 2014-09-23 DIAGNOSIS — I119 Hypertensive heart disease without heart failure: Secondary | ICD-10-CM

## 2014-09-23 DIAGNOSIS — I442 Atrioventricular block, complete: Secondary | ICD-10-CM | POA: Diagnosis not present

## 2014-09-23 DIAGNOSIS — E785 Hyperlipidemia, unspecified: Secondary | ICD-10-CM

## 2014-09-23 NOTE — Patient Instructions (Signed)
Medication Instructions:  Your physician recommends that you continue on your current medications as directed. Please refer to the Current Medication list given to you today.  Labwork: NONE  Testing/Procedures: NONE  Follow-Up: Your physician wants you to follow-up in: [place] will receive a reminder letter in the mail two months in advance. If you don't receive a letter, please call our office to schedule the follow-up appointment.

## 2014-09-23 NOTE — Progress Notes (Signed)
Cardiology Office Note   Date:  09/23/2014   ID:  Charles Olson, DOB 18-Apr-1930, MRN [id number]  PCP:  Donnajean Lopes, MD  Cardiologist: Darlin Coco MD  No chief complaint on file.     History of Present Illness: Charles Olson is a 81 y.o. male who presents for follow-up office visit.  This pleasant 81 year old gentleman is seen for a six-month followup office visit. He has a past history of hypertension and a history of hypercholesterolemia. He also has a history of idiopathic AV block and had a permanent pacemaker initially implanted about 10 years ago. He had a generator replacement in August 2014. He has been doing well.  Last year he had successful cataract surgery on both eyes by Dr. Satira Sark. He continues to feel very well. He lives [place] which keeps him busy. He stays physically very active.  Recently he has been cutting down some trees.  He stays physically fit and his weight is down 6 pounds since last visit.  He checks his blood pressure at home and it runs normal.  It was slightly high today  Past Medical History  Diagnosis Date  . AVB (atrioventricular block)     HIGH DEGREE; s/p PPM implantation  . Hyperlipidemia   . GERD (gastroesophageal reflux disease)   . Sinusitis   . History of orthostatic hypotension   . Syncope and collapse   . Chest pain   . Esophageal stricture   . BPH (benign prostatic hypertrophy)     Past Surgical History  Procedure Laterality Date  . Pacemaker insertion  11/1998; 12/2012    Medtronic [id number] implanted by Dr Verlon Setting in 2008; generator change to STJ Assurity 12/2012 by Dr Rayann Heman  . Cardiac catheterization  06/2003    NORMAL CORONARIES  . Cholecystectomy    . Pacemaker generator change N/A 01/01/2013    Procedure: PACEMAKER GENERATOR CHANGE;  Surgeon: Thompson Grayer, MD;  Location: [facility] CATH LAB;  Service: Cardiovascular;  Laterality: N/A;     Current Outpatient Prescriptions  Medication Sig Dispense Refill  .  Cholecalciferol (VITAMIN D) 1000 UNITS capsule Take 1,000 Units by mouth daily.     . flunisolide (NASAREL) 29 MCG/ACT (0.025%) nasal spray Place 1 spray into the nose daily.    Marland Kitchen Lysine 500 MG TABS Take 500 mg by mouth daily.     . Multiple Vitamin (MULTIVITAMIN) tablet Take 1 tablet by mouth daily.     . pantoprazole (PROTONIX) 40 MG tablet Take 40 mg by mouth daily.     . simvastatin (ZOCOR) 80 MG tablet Take 40 mg by mouth daily.     No current facility-administered medications for this visit.    Allergies:   Aspirin and Erythromycin    Social History:  The patient  reports that he has never smoked. He has never used smokeless tobacco. He reports that he does not drink alcohol or use illicit drugs.   Family History:  The patient's family history includes Asthma in his mother; CVA in his mother; Colon cancer in an other family member; Heart attack in his brother.    ROS:  Please see the history of present illness.   Otherwise, review of systems are positive for none.   All other systems are reviewed and negative.    PHYSICAL EXAM: VS:  BP 142/64 mmHg  Pulse 57  Ht 5\' 10"  (1.778 m)  Wt 150 lb 1.9 oz (68.094 kg)  BMI 21.54 kg/m2 , BMI Body  mass index is 21.54 kg/(m^2). GEN: Well nourished, well developed, in no acute distress HEENT: normal Neck: no JVD, carotid bruits, or masses Cardiac: RRR; no murmurs, rubs, or gallops,no edema  Respiratory:  clear to auscultation bilaterally, normal work of breathing GI: soft, nontender, nondistended, + BS MS: no deformity or atrophy Skin: warm and dry, no rash Neuro:  Strength and sensation are intact Psych: euthymic mood, full affect   EKG:  EKG is not ordered today.    Recent Labs: No results found for requested labs within last 365 days.    Lipid Panel No results found for: CHOL, TRIG, HDL, CHOLHDL, VLDL, LDLCALC, LDLDIRECT    Wt Readings from Last 3 Encounters:  09/23/14 150 lb 1.9 oz (68.094 kg)  03/26/14 156 lb (70.761  kg)  01/08/14 154 lb (69.854 kg)        ASSESSMENT AND PLAN:  1. complete heart block 2. Hyperlipidemia.  Lipids are satisfactory.  He gets his blood work done at the [facility] and he brought in copies for [place] today 3. essential hypertension  Plan: Continue current regimen. Continue full activity. Recheck 6 months   Current medicines are reviewed at length with the patient today.  The patient does not have concerns regarding medicines.  The following changes have been made:  no change  Labs/ tests ordered today include:  No orders of the defined types were placed in this encounter.      Berna Spare MD 09/23/2014 5:28 PM    [address], [place], [place]  [address] Phone: [phone]; Fax: [phone]

## 2014-10-09 ENCOUNTER — Ambulatory Visit (INDEPENDENT_AMBULATORY_CARE_PROVIDER_SITE_OTHER): Payer: Medicare HMO | Admitting: *Deleted

## 2014-10-09 ENCOUNTER — Telehealth: Payer: Self-pay | Admitting: Cardiology

## 2014-10-09 ENCOUNTER — Encounter: Payer: Self-pay | Admitting: Internal Medicine

## 2014-10-09 DIAGNOSIS — I442 Atrioventricular block, complete: Secondary | ICD-10-CM

## 2014-10-09 NOTE — Telephone Encounter (Signed)
Spoke with pt and reminded pt of remote transmission that is due today. Pt verbalized understanding.   

## 2014-10-09 NOTE — Progress Notes (Signed)
Remote pacemaker transmission.   

## 2014-10-13 LAB — CUP PACEART REMOTE DEVICE CHECK
Battery Remaining Longevity: 111 mo
Brady Statistic AP VS Percent: 1 %
Brady Statistic AS VS Percent: 1 %
Brady Statistic RA Percent Paced: 39 %
Lead Channel Impedance Value: 380 Ohm
MDC IDC MSMT BATTERY REMAINING PERCENTAGE: 95.5 %
MDC IDC MSMT BATTERY VOLTAGE: 3.01 V
MDC IDC MSMT LEADCHNL RA PACING THRESHOLD AMPLITUDE: 0.5 V
MDC IDC MSMT LEADCHNL RA PACING THRESHOLD PULSEWIDTH: 0.4 ms
MDC IDC MSMT LEADCHNL RA SENSING INTR AMPL: 3.3 mV
MDC IDC MSMT LEADCHNL RV IMPEDANCE VALUE: 390 Ohm
MDC IDC MSMT LEADCHNL RV PACING THRESHOLD AMPLITUDE: 0.75 V
MDC IDC MSMT LEADCHNL RV PACING THRESHOLD PULSEWIDTH: 0.4 ms
MDC IDC MSMT LEADCHNL RV SENSING INTR AMPL: 12 mV
MDC IDC PG SERIAL: 2974994
MDC IDC SESS DTM: 20160519171654
MDC IDC SET LEADCHNL RA PACING AMPLITUDE: 2 V
MDC IDC SET LEADCHNL RV PACING AMPLITUDE: 2.5 V
MDC IDC SET LEADCHNL RV PACING PULSEWIDTH: 0.4 ms
MDC IDC SET LEADCHNL RV SENSING SENSITIVITY: 4 mV
MDC IDC STAT BRADY AP VP PERCENT: 39 %
MDC IDC STAT BRADY AS VP PERCENT: 61 %
MDC IDC STAT BRADY RV PERCENT PACED: 99 %
Pulse Gen Model: 2240

## 2014-10-29 ENCOUNTER — Encounter: Payer: Self-pay | Admitting: Cardiology

## 2014-11-17 ENCOUNTER — Other Ambulatory Visit: Payer: Self-pay

## 2015-01-12 ENCOUNTER — Encounter: Payer: Self-pay | Admitting: Internal Medicine

## 2015-01-12 ENCOUNTER — Ambulatory Visit (INDEPENDENT_AMBULATORY_CARE_PROVIDER_SITE_OTHER): Payer: Medicare HMO | Admitting: Internal Medicine

## 2015-01-12 VITALS — BP 150/70 | HR 71 | Ht 69.0 in | Wt 148.6 lb

## 2015-01-12 DIAGNOSIS — I10 Essential (primary) hypertension: Secondary | ICD-10-CM

## 2015-01-12 DIAGNOSIS — I442 Atrioventricular block, complete: Secondary | ICD-10-CM

## 2015-01-12 LAB — CUP PACEART INCLINIC DEVICE CHECK
Lead Channel Impedance Value: 425 Ohm
Lead Channel Pacing Threshold Amplitude: 0.5 V
Lead Channel Pacing Threshold Amplitude: 0.75 V
Lead Channel Pacing Threshold Pulse Width: 0.4 ms
Lead Channel Pacing Threshold Pulse Width: 0.4 ms
Lead Channel Setting Sensing Sensitivity: 4 mV
MDC IDC MSMT BATTERY REMAINING LONGEVITY: 110.4 mo
MDC IDC MSMT BATTERY VOLTAGE: 2.99 V
MDC IDC MSMT LEADCHNL RA IMPEDANCE VALUE: 400 Ohm
MDC IDC MSMT LEADCHNL RA SENSING INTR AMPL: 4.2 mV
MDC IDC SESS DTM: 20160822111809
MDC IDC SET LEADCHNL RA PACING AMPLITUDE: 2 V
MDC IDC SET LEADCHNL RV PACING AMPLITUDE: 2.5 V
MDC IDC SET LEADCHNL RV PACING PULSEWIDTH: 0.4 ms
MDC IDC STAT BRADY RA PERCENT PACED: 42 %
MDC IDC STAT BRADY RV PERCENT PACED: 99.93 %
Pulse Gen Model: 2240
Pulse Gen Serial Number: 2974994

## 2015-01-12 NOTE — Progress Notes (Signed)
PCP: Donnajean Lopes, MD Primary Cardiologist:  Dr Emeline General is a 81 y.o. male who presents today for routine electrophysiology followup.  Since last being seen in our clinic, the patient reports doing very well.  Today, he denies symptoms of palpitations, chest pain, shortness of breath,  lower extremity edema, dizziness, presyncope, or syncope.  The patient is otherwise without complaint today.   Past Medical History  Diagnosis Date  . AVB (atrioventricular block)     HIGH DEGREE; s/p PPM implantation  . Hyperlipidemia   . GERD (gastroesophageal reflux disease)   . Sinusitis   . History of orthostatic hypotension   . Syncope and collapse   . Chest pain   . Esophageal stricture   . BPH (benign prostatic hypertrophy)    Past Surgical History  Procedure Laterality Date  . Pacemaker insertion  11/1998; 12/2012    Medtronic [id number] implanted by Dr Verlon Setting in 2008; generator change to STJ Assurity 12/2012 by Dr Rayann Heman  . Cardiac catheterization  06/2003    NORMAL CORONARIES  . Cholecystectomy    . Pacemaker generator change N/A 01/01/2013    Procedure: PACEMAKER GENERATOR CHANGE;  Surgeon: Thompson Grayer, MD;  Location: [facility] CATH LAB;  Service: Cardiovascular;  Laterality: N/A;    Current Outpatient Prescriptions  Medication Sig Dispense Refill  . Cholecalciferol (VITAMIN D) 1000 UNITS capsule Take 1,000 Units by mouth daily.     . flunisolide (NASAREL) 29 MCG/ACT (0.025%) nasal spray Place 1 spray into the nose daily.    Marland Kitchen Lysine 500 MG TABS Take 500 mg by mouth daily.     . Multiple Vitamin (MULTIVITAMIN) tablet Take 1 tablet by mouth daily.     . pantoprazole (PROTONIX) 40 MG tablet Take 40 mg by mouth daily.     . simvastatin (ZOCOR) 80 MG tablet Take 40 mg by mouth daily.     No current facility-administered medications for this visit.    Physical Exam: Filed Vitals:   01/12/15 1027  BP: 150/70  Pulse: 71  Height: 5\' 9"  (1.753 m)  Weight: 67.405 kg (148 lb 9.6  oz)    GEN- The patient is well appearing, alert and oriented x 3 today.   Head- normocephalic, atraumatic Eyes-  Sclera clear, conjunctiva pink Ears- hearing intact Oropharynx- clear Lungs- Clear to ausculation bilaterally, normal work of breathing Chest- R sided pacemaker pocket is well healed Heart- Regular rate and rhythm, no murmurs, rubs or gallops, PMI not laterally displaced GI- soft, NT, ND, + BS Extremities- no clubbing, cyanosis, or edema  Pacemaker interrogation- reviewed in detail today,  See PACEART report Labs from [place] primary care reviewed  Assessment and Plan:  1. Complete heart block Normal pacemaker function See Pace Art report No changes today  2. HTN Reports good control at home though a little high today  Merlin Return in 1 year to see EP NP

## 2015-01-12 NOTE — Patient Instructions (Signed)
Medication Instructions:  Your physician recommends that you continue on your current medications as directed. Please refer to the Current Medication list given to you today.   Labwork: None ordered  Testing/Procedures: None ordered  Follow-Up: Your physician wants you to follow-up in: 12 months with [name], NP You will receive a reminder letter in the mail two months in advance. If you don't receive a letter, please call our office to schedule the follow-up appointment.  Remote monitoring is used to monitor your Pacemaker  from home. This monitoring reduces the number of office visits required to check your device to one time per year. It allows us to keep an eye on the functioning of your device to ensure it is working properly. You are scheduled for a device check from home on 04/13/15. You may send your transmission at any time that day. If you have a wireless device, the transmission will be sent automatically. After your physician reviews your transmission, you will receive a postcard with your next transmission date.     Any Other Special Instructions Will Be Listed Below (If Applicable).   

## 2015-03-04 DIAGNOSIS — Z1283 Encounter for screening for malignant neoplasm of skin: Secondary | ICD-10-CM | POA: Diagnosis not present

## 2015-03-04 DIAGNOSIS — B353 Tinea pedis: Secondary | ICD-10-CM | POA: Diagnosis not present

## 2015-03-04 DIAGNOSIS — L570 Actinic keratosis: Secondary | ICD-10-CM | POA: Diagnosis not present

## 2015-03-04 DIAGNOSIS — X32XXXD Exposure to sunlight, subsequent encounter: Secondary | ICD-10-CM | POA: Diagnosis not present

## 2015-03-04 DIAGNOSIS — L280 Lichen simplex chronicus: Secondary | ICD-10-CM | POA: Diagnosis not present

## 2015-03-30 ENCOUNTER — Encounter: Payer: Self-pay | Admitting: Cardiology

## 2015-03-30 ENCOUNTER — Ambulatory Visit (INDEPENDENT_AMBULATORY_CARE_PROVIDER_SITE_OTHER): Payer: Medicare HMO | Admitting: Cardiology

## 2015-03-30 VITALS — BP 148/82 | HR 55 | Ht 70.5 in | Wt 147.4 lb

## 2015-03-30 DIAGNOSIS — Z950 Presence of cardiac pacemaker: Secondary | ICD-10-CM

## 2015-03-30 DIAGNOSIS — E785 Hyperlipidemia, unspecified: Secondary | ICD-10-CM | POA: Diagnosis not present

## 2015-03-30 DIAGNOSIS — I442 Atrioventricular block, complete: Secondary | ICD-10-CM | POA: Diagnosis not present

## 2015-03-30 DIAGNOSIS — I10 Essential (primary) hypertension: Secondary | ICD-10-CM | POA: Diagnosis not present

## 2015-03-30 NOTE — Patient Instructions (Signed)
Medication Instructions:  Your physician recommends that you continue on your current medications as directed. Please refer to the Current Medication list given to you today.  Labwork: none  Testing/Procedures: none  Follow-Up: Your physician wants you to follow-up in: 6 month ov with Dr Dawna Part will receive a reminder letter in the mail two months in advance. If you don't receive a letter, please call our office to schedule the follow-up appointment.   If you need a refill on your cardiac medications before your next appointment, please call your pharmacy.

## 2015-03-30 NOTE — Progress Notes (Signed)
Cardiology Office Note   Date:  03/30/2015   ID:  Charles Olson, DOB [birth year], MRN [id number]  PCP:  Donnajean Lopes, MD  Cardiologist: Darlin Coco MD  Chief Complaint  Patient presents with  . Hypertension      History of Present Illness: Charles Olson is a 81 y.o. male who presents for a six-month follow-up visit  This pleasant 81 year old gentleman is seen for a six-month followup office visit. He has a past history of hypertension and a history of hypercholesterolemia. He also has a history of idiopathic AV block and had a permanent pacemaker initially implanted about 10 years ago. He continues to feel very well. He lives [place] of land which keeps him busy. He stays physically very active.  He stays physically fit and his weight is down 1 pounds since last visit. He checks his blood pressure at home and it runs normal. It was slightly high today. Since we last saw him he went to the [place] and got some new hearing aids which are working better than his old ones.  Past Medical History  Diagnosis Date  . AVB (atrioventricular block)     HIGH DEGREE; s/p PPM implantation  . Hyperlipidemia   . GERD (gastroesophageal reflux disease)   . Sinusitis   . History of orthostatic hypotension   . Syncope and collapse   . Chest pain   . Esophageal stricture   . BPH (benign prostatic hypertrophy)     Past Surgical History  Procedure Laterality Date  . Pacemaker insertion  11/1998; 12/2012    Medtronic [id number] implanted by Dr Verlon Setting in 2008; generator change to STJ Assurity 12/2012 by Dr Rayann Heman  . Cardiac catheterization  06/2003    NORMAL CORONARIES  . Cholecystectomy    . Pacemaker generator change N/A 01/01/2013    Procedure: PACEMAKER GENERATOR CHANGE;  Surgeon: Thompson Grayer, MD;  Location: [facility] CATH LAB;  Service: Cardiovascular;  Laterality: N/A;     Current Outpatient Prescriptions  Medication Sig Dispense Refill  . Cholecalciferol (VITAMIN D) 1000 UNITS  capsule Take 1,000 Units by mouth daily.     . clobetasol cream (TEMOVATE) 6.65 % Apply 1 application topically 2 (two) times daily. Apply to RIGHT foot    . flunisolide (NASAREL) 29 MCG/ACT (0.025%) nasal spray Place 1 spray into the nose daily.    Marland Kitchen ketoconazole (NIZORAL) 2 % cream Apply 1 application topically 2 (two) times daily. Apply to RIGHTleg    . Lysine 500 MG TABS Take 500 mg by mouth daily.     . Multiple Vitamin (MULTIVITAMIN) tablet Take 1 tablet by mouth daily.     . pantoprazole (PROTONIX) 40 MG tablet Take 40 mg by mouth daily.     . simvastatin (ZOCOR) 80 MG tablet Take 40 mg by mouth daily.     No current facility-administered medications for this visit.    Allergies:   Aspirin and Erythromycin    Social History:  The patient  reports that he has never smoked. He has never used smokeless tobacco. He reports that he does not drink alcohol or use illicit drugs.   Family History:  The patient's family history includes Asthma in his mother; CVA in his mother; Colon cancer in an other family member; Heart attack in his brother.    ROS:  Please see the history of present illness.   Otherwise, review of systems are positive for none.   All other systems are reviewed and negative.  PHYSICAL EXAM: VS:  BP 148/82 mmHg  Pulse 55  Ht 5' 10.5" (1.791 m)  Wt 147 lb 6.4 oz (66.86 kg)  BMI 20.84 kg/m2 , BMI Body mass index is 20.84 kg/(m^2). GEN: Well nourished, well developed, in no acute distress HEENT: normal Neck: no JVD, carotid bruits, or masses Cardiac: RRR; no murmurs, rubs, or gallops,no edema  Respiratory:  clear to auscultation bilaterally, normal work of breathing GI: soft, nontender, nondistended, + BS MS: no deformity or atrophy Skin: warm and dry, no rash Neuro:  Strength and sensation are intact Psych: euthymic mood, full affect   EKG:  EKG is ordered today. The ekg ordered today demonstrates AV sequential pacemaker at 55 bpm   Recent Labs: No  results found for requested labs within last 365 days.    Lipid Panel No results found for: CHOL, TRIG, HDL, CHOLHDL, VLDL, LDLCALC, LDLDIRECT    Wt Readings from Last 3 Encounters:  03/30/15 147 lb 6.4 oz (66.86 kg)  01/12/15 148 lb 9.6 oz (67.405 kg)  09/23/14 150 lb 1.9 oz (68.094 kg)         ASSESSMENT AND PLAN:  1. complete heart block, dual-chamber pacemaker functioning normally. 2. Hyperlipidemia. Lipids are satisfactory. He gets his blood work done at the [facility] and he brought in copies for [place] today 3. essential hypertension with history of whitecoat hypertension 4.  Hearing loss, improved with new hearing aids from the [facility] medicines are reviewed at length with the patient today.  The patient does not have concerns regarding medicines.  The following changes have been made:  no change  Labs/ tests ordered today include:   Orders Placed This Encounter  Procedures  . EKG 12-Lead     Disposition: Continue current medication.  Recheck in 6 months for office visit with Dr. Marlou Porch.  Berna Spare MD 03/30/2015 11:34 AM    [address], [place], [place]  [address] Phone: [phone]; Fax: [phone]

## 2015-04-13 ENCOUNTER — Ambulatory Visit (INDEPENDENT_AMBULATORY_CARE_PROVIDER_SITE_OTHER): Payer: Medicare HMO | Admitting: *Deleted

## 2015-04-13 DIAGNOSIS — I442 Atrioventricular block, complete: Secondary | ICD-10-CM | POA: Diagnosis not present

## 2015-04-13 NOTE — Progress Notes (Signed)
Remote pacemaker transmission.   

## 2015-04-14 DIAGNOSIS — R69 Illness, unspecified: Secondary | ICD-10-CM | POA: Diagnosis not present

## 2015-04-15 LAB — CUP PACEART REMOTE DEVICE CHECK
Battery Remaining Longevity: 111 mo
Battery Remaining Percentage: 95.5 %
Brady Statistic AP VS Percent: 1 %
Brady Statistic AS VP Percent: 62 %
Brady Statistic AS VS Percent: 1 %
Brady Statistic RA Percent Paced: 38 %
Implantable Lead Implant Date: 20000712
Implantable Lead Location: 753860
Implantable Lead Model: 4460
Implantable Lead Serial Number: 20312
Lead Channel Impedance Value: 400 Ohm
Lead Channel Sensing Intrinsic Amplitude: 3.7 mV
Lead Channel Setting Pacing Amplitude: 2 V
Lead Channel Setting Pacing Amplitude: 2.5 V
Lead Channel Setting Sensing Sensitivity: 4 mV
MDC IDC LEAD IMPLANT DT: 20000712
MDC IDC LEAD LOCATION: 753859
MDC IDC LEAD SERIAL: 200213
MDC IDC MSMT BATTERY VOLTAGE: 2.99 V
MDC IDC MSMT LEADCHNL RA IMPEDANCE VALUE: 380 Ohm
MDC IDC SESS DTM: 20161121070016
MDC IDC SET LEADCHNL RV PACING PULSEWIDTH: 0.4 ms
MDC IDC STAT BRADY AP VP PERCENT: 38 %
MDC IDC STAT BRADY RV PERCENT PACED: 99 %
Pulse Gen Serial Number: 2974994

## 2015-04-20 ENCOUNTER — Encounter: Payer: Self-pay | Admitting: Cardiology

## 2015-07-13 ENCOUNTER — Ambulatory Visit (INDEPENDENT_AMBULATORY_CARE_PROVIDER_SITE_OTHER): Payer: Medicare HMO | Admitting: *Deleted

## 2015-07-13 DIAGNOSIS — I442 Atrioventricular block, complete: Secondary | ICD-10-CM | POA: Diagnosis not present

## 2015-07-13 NOTE — Progress Notes (Signed)
Remote pacemaker transmission.   

## 2015-08-06 LAB — CUP PACEART REMOTE DEVICE CHECK
Battery Remaining Longevity: 110 mo
Battery Remaining Percentage: 95.5 %
Battery Voltage: 2.99 V
Brady Statistic AP VP Percent: 38 %
Brady Statistic AP VS Percent: 1 %
Brady Statistic AS VP Percent: 62 %
Brady Statistic AS VS Percent: 1 %
Brady Statistic RA Percent Paced: 38 %
Brady Statistic RV Percent Paced: 99 %
Date Time Interrogation Session: 20170220071501
Implantable Lead Implant Date: 20000712
Implantable Lead Implant Date: 20000712
Implantable Lead Location: 753859
Implantable Lead Location: 753860
Implantable Lead Model: 4460
Implantable Lead Model: 4461
Implantable Lead Serial Number: 200213
Implantable Lead Serial Number: 20312
Lead Channel Impedance Value: 380 Ohm
Lead Channel Impedance Value: 380 Ohm
Lead Channel Sensing Intrinsic Amplitude: 12 mV
Lead Channel Sensing Intrinsic Amplitude: 3.2 mV
Lead Channel Setting Pacing Amplitude: 2 V
Lead Channel Setting Pacing Amplitude: 2.5 V
Lead Channel Setting Pacing Pulse Width: 0.4 ms
Lead Channel Setting Sensing Sensitivity: 4 mV
Pulse Gen Model: 2240
Pulse Gen Serial Number: 2974994

## 2015-08-07 ENCOUNTER — Encounter: Payer: Self-pay | Admitting: Cardiology

## 2015-09-08 DIAGNOSIS — Z1283 Encounter for screening for malignant neoplasm of skin: Secondary | ICD-10-CM | POA: Diagnosis not present

## 2015-09-08 DIAGNOSIS — L821 Other seborrheic keratosis: Secondary | ICD-10-CM | POA: Diagnosis not present

## 2015-10-07 ENCOUNTER — Ambulatory Visit (INDEPENDENT_AMBULATORY_CARE_PROVIDER_SITE_OTHER): Payer: Medicare HMO | Admitting: Cardiology

## 2015-10-07 ENCOUNTER — Encounter: Payer: Self-pay | Admitting: Cardiology

## 2015-10-07 VITALS — BP 140/88 | HR 58 | Ht 69.5 in | Wt 143.8 lb

## 2015-10-07 DIAGNOSIS — Z950 Presence of cardiac pacemaker: Secondary | ICD-10-CM | POA: Diagnosis not present

## 2015-10-07 DIAGNOSIS — I442 Atrioventricular block, complete: Secondary | ICD-10-CM

## 2015-10-07 DIAGNOSIS — I10 Essential (primary) hypertension: Secondary | ICD-10-CM | POA: Diagnosis not present

## 2015-10-07 DIAGNOSIS — E785 Hyperlipidemia, unspecified: Secondary | ICD-10-CM

## 2015-10-07 NOTE — Patient Instructions (Signed)

## 2015-10-07 NOTE — Progress Notes (Signed)
Cardiology Office Note    Date:  10/07/2015   ID:  Charles Olson, DOB February 25, 1930, MRN [id number]  PCP:  Donnajean Lopes, MD  Cardiologist:   Candee Furbish, MD     History of Present Illness:  Charles Olson is a 82 y.o. male former patient of Dr. Sherryl Barters here for follow-up. History of pacemaker secondary to idiopathic AV block implanted around year 2008. Doing well. Has a history of hypertension. [facility] also helps him with hearing aids.  Overall he is doing very well. No syncope. He remembers during the last pacemaker change out feeling funny as the wires are being exchanged and his heart rate paused.  At age 68 he changed jobs, 3 kids at the time. Became "[name]". Enjoyed his work into his mid 47s. 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  His wife is Charles Olson.     Past Medical History  Diagnosis Date  . AVB (atrioventricular block)     HIGH DEGREE; s/p PPM implantation  . Hyperlipidemia   . GERD (gastroesophageal reflux disease)   . Sinusitis   . History of orthostatic hypotension   . Syncope and collapse   . Chest pain   . Esophageal stricture   . BPH (benign prostatic hypertrophy)     Past Surgical History  Procedure Laterality Date  . Pacemaker insertion  11/1998; 12/2012    Medtronic [id number] implanted by Dr Verlon Setting in 2008; generator change to STJ Assurity 12/2012 by Dr Rayann Heman  . Cardiac catheterization  06/2003    NORMAL CORONARIES  . Cholecystectomy    . Pacemaker generator change N/A 01/01/2013    Procedure: PACEMAKER GENERATOR CHANGE;  Surgeon: Thompson Grayer, MD;  Location: [facility] CATH LAB;  Service: Cardiovascular;  Laterality: N/A;    Current Medications: Outpatient Prescriptions Prior to Visit  Medication Sig Dispense Refill  . Cholecalciferol (VITAMIN D) 1000 UNITS capsule Take 1,000 Units by mouth daily.     . clobetasol cream (TEMOVATE) AB-123456789 % Apply 1 application topically 2 (two) times daily. Apply to RIGHT foot    . flunisolide  (NASAREL) 29 MCG/ACT (0.025%) nasal spray Place 1 spray into the nose daily.    Marland Kitchen ketoconazole (NIZORAL) 2 % cream Apply 1 application topically 2 (two) times daily. Apply to RIGHTleg    . Lysine 500 MG TABS Take 500 mg by mouth daily.     . Multiple Vitamin (MULTIVITAMIN) tablet Take 1 tablet by mouth daily.     . pantoprazole (PROTONIX) 40 MG tablet Take 40 mg by mouth daily.     . simvastatin (ZOCOR) 80 MG tablet Take 40 mg by mouth daily.     No facility-administered medications prior to visit.     Allergies:   Aspirin and Erythromycin   Social History   Social History  . Marital Status: Married    Spouse Name: N/A  . Number of Children: 3  . Years of Education: N/A   Occupational History  . Retired    Social History Main Topics  . Smoking status: Never Smoker   . Smokeless tobacco: Never Used  . Alcohol Use: No  . Drug Use: No  . Sexual Activity: Not Asked   Other Topics Concern  . None   Social History Narrative     Family History:  The patient's family history includes Asthma in his mother; CVA in his mother; Heart attack in his brother.   ROS:   Please see the history of present illness.  ROS All other systems reviewed and are negative.   PHYSICAL EXAM:   VS:  BP 140/88 mmHg  Pulse 58  Ht 5' 9.5" (1.765 m)  Wt 143 lb 12.8 oz (65.227 kg)  BMI 20.94 kg/m2   GEN: Well nourished, well developed, in no acute distress HEENT: normal Neck: no JVD, carotid bruits, or masses Cardiac: RRR; no murmurs, rubs, or gallops,no edema  Respiratory:  clear to auscultation bilaterally, normal work of breathing GI: soft, nontender, nondistended, + BS MS: no deformity or atrophy Skin: warm and dry, no rash Neuro:  Alert and Oriented x 3, Strength and sensation are intact Psych: euthymic mood, full affect  Wt Readings from Last 3 Encounters:  10/07/15 143 lb 12.8 oz (65.227 kg)  03/30/15 147 lb 6.4 oz (66.86 kg)  01/12/15 148 lb 9.6 oz (67.405 kg)       Studies/Labs Reviewed:   EKG:  03/30/15-AV pacing sequential.  Recent Labs: No results found for requested labs within last 365 days.   Lipid Panel No results found for: CHOL, TRIG, HDL, CHOLHDL, VLDL, LDLCALC, LDLDIRECT  Additional studies/ records that were reviewed today include:  Immunizations are up-to-date. LDL 77, HDL 50, triglycerides 72, hemoglobin A1c 5.3, hemoglobin 14.4 platelets 183, creatinine 1.1, sodium 142, potassium 4.4, TSH 1.1, ALT 26.  Prior notes reviewed, lab work reviewed, pacemaker reviewed    ASSESSMENT:    1. Complete heart block ([place])   2. Essential hypertension, benign   3. Dyslipidemia   4. Cardiac pacemaker in situ      PLAN:  In order of problems listed above:  Complete heart block  - Pacemaker placement 2008. Change out 2014. Functioning well. Dr. Rayann Heman. St Jude now.   Hyperlipidemia  - LDL excellent. Continue with the statin  [place] hypertension  - Usually upon repeat, normal. Overall doing well. No medications.  Pacemaker implantation  - He is on third pacemaker. Doing well. He is 100% pacemaker dependent. AV block.  Hearing loss  [facility].   Medication Adjustments/Labs and Tests Ordered: Current medicines are reviewed at length with the patient today.  Concerns regarding medicines are outlined above.  Medication changes, Labs and Tests ordered today are listed in the Patient Instructions below. Patient Instructions  Medication Instructions:  The current medical regimen is effective;  continue present plan and medications.  Follow-Up: Follow up in 6 months with Dr. Marlou Porch.  You will receive a letter in the mail 2 months before you are due.  Please call us when you receive this letter to schedule your follow up appointment.  If you need a refill on your cardiac medications before your next appointment, please call your pharmacy.  Thank you for choosing [facility]!!           Signed, Candee Furbish, MD  10/07/2015 10:41 AM    [address], [place], [place]  [address] Phone: [phone]; Fax: [phone]

## 2015-10-12 ENCOUNTER — Ambulatory Visit (INDEPENDENT_AMBULATORY_CARE_PROVIDER_SITE_OTHER): Payer: Medicare HMO | Admitting: *Deleted

## 2015-10-12 DIAGNOSIS — I442 Atrioventricular block, complete: Secondary | ICD-10-CM | POA: Diagnosis not present

## 2015-10-12 NOTE — Progress Notes (Signed)
Remote pacemaker transmission.   

## 2015-11-02 LAB — CUP PACEART REMOTE DEVICE CHECK
Battery Remaining Longevity: 111 mo
Battery Voltage: 2.99 V
Brady Statistic AP VP Percent: 39 %
Brady Statistic AP VS Percent: 1 %
Brady Statistic AS VP Percent: 61 %
Brady Statistic AS VS Percent: 1 %
Implantable Lead Implant Date: 20000712
Implantable Lead Location: 753859
Implantable Lead Serial Number: 20312
Lead Channel Impedance Value: 400 Ohm
Lead Channel Sensing Intrinsic Amplitude: 12 mV
Lead Channel Sensing Intrinsic Amplitude: 3.3 mV
Lead Channel Setting Pacing Amplitude: 2 V
Lead Channel Setting Pacing Amplitude: 2.5 V
Lead Channel Setting Sensing Sensitivity: 4 mV
MDC IDC LEAD IMPLANT DT: 20000712
MDC IDC LEAD LOCATION: 753860
MDC IDC LEAD SERIAL: 200213
MDC IDC MSMT BATTERY REMAINING PERCENTAGE: 95.5 %
MDC IDC MSMT LEADCHNL RA IMPEDANCE VALUE: 390 Ohm
MDC IDC SESS DTM: 20170522075421
MDC IDC SET LEADCHNL RV PACING PULSEWIDTH: 0.4 ms
MDC IDC STAT BRADY RA PERCENT PACED: 39 %
MDC IDC STAT BRADY RV PERCENT PACED: 99 %
Pulse Gen Serial Number: 2974994

## 2015-11-05 ENCOUNTER — Encounter: Payer: Self-pay | Admitting: Cardiology

## 2015-11-10 DIAGNOSIS — H43813 Vitreous degeneration, bilateral: Secondary | ICD-10-CM | POA: Diagnosis not present

## 2015-11-10 DIAGNOSIS — H40012 Open angle with borderline findings, low risk, left eye: Secondary | ICD-10-CM | POA: Diagnosis not present

## 2015-11-10 DIAGNOSIS — H52203 Unspecified astigmatism, bilateral: Secondary | ICD-10-CM | POA: Diagnosis not present

## 2015-11-10 DIAGNOSIS — H40011 Open angle with borderline findings, low risk, right eye: Secondary | ICD-10-CM | POA: Diagnosis not present

## 2015-11-30 DIAGNOSIS — R8299 Other abnormal findings in urine: Secondary | ICD-10-CM | POA: Diagnosis not present

## 2015-11-30 DIAGNOSIS — R310 Gross hematuria: Secondary | ICD-10-CM | POA: Diagnosis not present

## 2015-11-30 DIAGNOSIS — I10 Essential (primary) hypertension: Secondary | ICD-10-CM | POA: Diagnosis not present

## 2015-11-30 DIAGNOSIS — Z6821 Body mass index (BMI) 21.0-21.9, adult: Secondary | ICD-10-CM | POA: Diagnosis not present

## 2015-11-30 DIAGNOSIS — N390 Urinary tract infection, site not specified: Secondary | ICD-10-CM | POA: Diagnosis not present

## 2015-12-02 ENCOUNTER — Encounter: Payer: Self-pay | Admitting: Nurse Practitioner

## 2016-01-18 ENCOUNTER — Encounter: Payer: Medicare HMO | Admitting: Nurse Practitioner

## 2016-01-20 NOTE — Progress Notes (Signed)
Electrophysiology Office Note Date: 01/21/2016  ID:  Charles Olson, DOB September 12, 1929, MRN [id number]  PCP: Donnajean Lopes, MD Primary Cardiologist: Marlou Porch Electrophysiologist: Allred  CC: Pacemaker follow-up  Charles Olson is a 82 y.o. male seen today for Dr Rayann Heman.  He presents today for routine electrophysiology followup.  Since last being seen in our clinic, the patient reports doing very well. He has noticed some hematuria that has been an ongoing issue.  He has follow up scheduled with urology. He denies chest pain, palpitations, dyspnea, PND, orthopnea, nausea, vomiting, dizziness, syncope, edema, weight gain, or early satiety.  Device History: STJ dual chamber PPM implanted 2000 for complete heart block; gen change 2005; gen change 2014   Past Medical History:  Diagnosis Date  . AVB (atrioventricular block)    HIGH DEGREE; s/p PPM implantation  . BPH (benign prostatic hypertrophy)   . Chest pain   . Esophageal stricture   . GERD (gastroesophageal reflux disease)   . History of orthostatic hypotension   . Hyperlipidemia   . Sinusitis   . Syncope and collapse    Past Surgical History:  Procedure Laterality Date  . CARDIAC CATHETERIZATION  06/2003   NORMAL CORONARIES  . CHOLECYSTECTOMY    . PACEMAKER GENERATOR CHANGE N/A 01/01/2013   Procedure: PACEMAKER GENERATOR CHANGE;  Surgeon: Thompson Grayer, MD;  [facility] C[facility] LAB;  Service: Cardiovascular;  Laterality: N/A;  . PACEMAKER INSERTION  11/1998; 12/2012   Medtronic [id number] implanted by Dr Verlon Setting in 2008; generator change to STJ Assurity 12/2012 by Dr Rayann Heman    Current Outpatient Prescriptions  Medication Sig Dispense Refill  . Cholecalciferol (VITAMIN D) 1000 UNITS capsule Take 1,000 Units by mouth daily.     . clobetasol cream (TEMOVATE) AB-123456789 % Apply 1 application topically 2 (two) times daily. Apply to RIGHT foot    . flunisolide (NASAREL) 29 MCG/ACT (0.025%) nasal spray Place 1 spray into the nose daily.    Marland Kitchen  ketoconazole (NIZORAL) 2 % cream Apply 1 application topically 2 (two) times daily. Apply to RIGHTleg    . Lysine 500 MG TABS Take 500 mg by mouth daily.     . Multiple Vitamin (MULTIVITAMIN) tablet Take 1 tablet by mouth daily.     . pantoprazole (PROTONIX) 40 MG tablet Take 40 mg by mouth daily.     . simvastatin (ZOCOR) 80 MG tablet Take 40 mg by mouth daily.     No current facility-administered medications for this visit.     Allergies:   Aspirin and Erythromycin   Social History: Social History   Social History  . Marital status: Married    Spouse name: N/A  . Number of children: 3  . Years of education: N/A   Occupational History  . Retired    Social History Main Topics  . Smoking status: Never Smoker  . Smokeless tobacco: Never Used  . Alcohol use No  . Drug use: No  . Sexual activity: Not on file   Other Topics Concern  . Not on file   Social History Narrative  . No narrative on file    Family History: Family History  Problem Relation Age of Onset  . Heart attack Brother   . CVA Mother   . Asthma Mother   . Colon cancer      ?   Review of Systems: All other systems reviewed and are otherwise negative except as noted above.   Physical Exam: VS:  BP (!) 150/76  Pulse (!) 55   Ht 5' 9.5" (1.765 m)   Wt 147 lb 12.8 oz (67 kg)   BMI 21.51 kg/m  , BMI Body mass index is 21.51 kg/m.  GEN- The patient is elderly appearing, alert and oriented x 3 today.   HEENT: normocephalic, atraumatic; sclera clear, conjunctiva pink; hearing intact; oropharynx clear; neck supple Lungs- Clear to ausculation bilaterally, normal work of breathing.  No wheezes, rales, rhonchi Heart- Regular rate and rhythm GI- soft, non-tender, non-distended, bowel sounds present Extremities- no clubbing, cyanosis, or edema; DP/PT/radial pulses 2+ bilaterally MS- no significant deformity or atrophy Skin- warm and dry, no rash or lesion; PPM pocket well healed Psych- euthymic mood,  full affect Neuro- strength and sensation are intact  PPM Interrogation- reviewed in detail today,  See PACEART report  EKG:  EKG is not ordered today.  Recent Labs: No results found for requested labs within last 8760 hours.   Wt Readings from Last 3 Encounters:  01/21/16 147 lb 12.8 oz (67 kg)  10/07/15 143 lb 12.8 oz (65.2 kg)  03/30/15 147 lb 6.4 oz (66.9 kg)     Other studies Reviewed: Additional studies/ records that were reviewed today include: Dr Rayann Heman and Dr Marlou Porch' office notes  Assessment and Plan:  1.  Complete heart block Normal PPM function See Pace Art report No changes today  2.  HTN Stable No change required today   Current medicines are reviewed at length with the patient today.   The patient does not have concerns regarding his medicines.  The following changes were made today:  none  Labs/ tests ordered today include:  Orders Placed This Encounter  Procedures  . EKG 12-Lead     Disposition:   Follow up with Delilah Shan, Dr Rayann Heman 1 year    Signed, Chanetta Marshall, NP 01/21/2016 10:03 AM  [address] [place]  [id number] [phone] (office) [phone] (fax

## 2016-01-21 ENCOUNTER — Encounter: Payer: Self-pay | Admitting: Internal Medicine

## 2016-01-21 ENCOUNTER — Encounter: Payer: Self-pay | Admitting: Nurse Practitioner

## 2016-01-21 ENCOUNTER — Ambulatory Visit (INDEPENDENT_AMBULATORY_CARE_PROVIDER_SITE_OTHER): Payer: Medicare HMO | Admitting: Nurse Practitioner

## 2016-01-21 VITALS — BP 150/76 | HR 55 | Ht 69.5 in | Wt 147.8 lb

## 2016-01-21 DIAGNOSIS — I442 Atrioventricular block, complete: Secondary | ICD-10-CM

## 2016-01-21 DIAGNOSIS — I10 Essential (primary) hypertension: Secondary | ICD-10-CM | POA: Diagnosis not present

## 2016-01-21 NOTE — Patient Instructions (Addendum)
Medication Instructions:  Your physician recommends that you continue on your current medications as directed. Please refer to the Current Medication list given to you today.   Labwork: None ordered  Testing/Procedures: None ordered  Follow-Up: Your physician wants you to follow-up in:  [place] DR. ALLRED  You will receive a reminder letter in the mail two months in advance. If you don't receive a letter, please call our office to schedule the follow-up appointment.  Remote monitoring is used to monitor your Pacemaker of ICD from home. This monitoring reduces the number of office visits required to check your device to one time per year. It allows [place] to keep an eye on the functioning of your device to ensure it is working properly. You are scheduled for a device check from home on 1 YEAR   You may send your transmission at any time that day. If you have a wireless device, the transmission will be sent automatically. After your physician reviews your transmission, you will receive a postcard with your next transmission date.      Any Other Special Instructions Will Be Listed Below (If Applicable).     If you need a refill on your cardiac medications before your next appointment, please call your pharmacy.

## 2016-01-22 LAB — CUP PACEART INCLINIC DEVICE CHECK
Date Time Interrogation Session: 20170901081452
Implantable Lead Implant Date: 20000712
Implantable Lead Model: 4461
Implantable Lead Serial Number: 200213
Implantable Lead Serial Number: 20312
MDC IDC LEAD IMPLANT DT: 20000712
MDC IDC LEAD LOCATION: 753859
MDC IDC LEAD LOCATION: 753860
MDC IDC PG SERIAL: 2974994
Pulse Gen Model: 2240

## 2016-01-27 DIAGNOSIS — R310 Gross hematuria: Secondary | ICD-10-CM | POA: Diagnosis not present

## 2016-01-28 DIAGNOSIS — N401 Enlarged prostate with lower urinary tract symptoms: Secondary | ICD-10-CM | POA: Diagnosis not present

## 2016-01-28 DIAGNOSIS — R310 Gross hematuria: Secondary | ICD-10-CM | POA: Diagnosis not present

## 2016-01-28 DIAGNOSIS — N200 Calculus of kidney: Secondary | ICD-10-CM | POA: Diagnosis not present

## 2016-01-28 DIAGNOSIS — N281 Cyst of kidney, acquired: Secondary | ICD-10-CM | POA: Diagnosis not present

## 2016-02-16 DIAGNOSIS — Z1389 Encounter for screening for other disorder: Secondary | ICD-10-CM | POA: Diagnosis not present

## 2016-02-16 DIAGNOSIS — R972 Elevated prostate specific antigen [PSA]: Secondary | ICD-10-CM | POA: Diagnosis not present

## 2016-02-16 DIAGNOSIS — R310 Gross hematuria: Secondary | ICD-10-CM | POA: Diagnosis not present

## 2016-02-16 DIAGNOSIS — E784 Other hyperlipidemia: Secondary | ICD-10-CM | POA: Diagnosis not present

## 2016-02-16 DIAGNOSIS — I10 Essential (primary) hypertension: Secondary | ICD-10-CM | POA: Diagnosis not present

## 2016-02-16 DIAGNOSIS — Z23 Encounter for immunization: Secondary | ICD-10-CM | POA: Diagnosis not present

## 2016-02-16 DIAGNOSIS — D329 Benign neoplasm of meninges, unspecified: Secondary | ICD-10-CM | POA: Diagnosis not present

## 2016-02-16 DIAGNOSIS — Z6822 Body mass index (BMI) 22.0-22.9, adult: Secondary | ICD-10-CM | POA: Diagnosis not present

## 2016-02-16 DIAGNOSIS — Z0000 Encounter for general adult medical examination without abnormal findings: Secondary | ICD-10-CM | POA: Diagnosis not present

## 2016-02-16 DIAGNOSIS — Z950 Presence of cardiac pacemaker: Secondary | ICD-10-CM | POA: Diagnosis not present

## 2016-04-04 DIAGNOSIS — R69 Illness, unspecified: Secondary | ICD-10-CM | POA: Diagnosis not present

## 2016-04-05 ENCOUNTER — Ambulatory Visit (INDEPENDENT_AMBULATORY_CARE_PROVIDER_SITE_OTHER): Payer: Medicare HMO | Admitting: Cardiology

## 2016-04-05 ENCOUNTER — Encounter: Payer: Self-pay | Admitting: Cardiology

## 2016-04-05 VITALS — BP 136/70 | HR 60 | Ht 68.0 in | Wt 145.6 lb

## 2016-04-05 DIAGNOSIS — Z950 Presence of cardiac pacemaker: Secondary | ICD-10-CM | POA: Diagnosis not present

## 2016-04-05 DIAGNOSIS — E785 Hyperlipidemia, unspecified: Secondary | ICD-10-CM | POA: Diagnosis not present

## 2016-04-05 DIAGNOSIS — I10 Essential (primary) hypertension: Secondary | ICD-10-CM

## 2016-04-05 DIAGNOSIS — I442 Atrioventricular block, complete: Secondary | ICD-10-CM | POA: Diagnosis not present

## 2016-04-05 NOTE — Patient Instructions (Signed)

## 2016-04-05 NOTE — Progress Notes (Signed)
Cardiology Office Note    Date:  04/05/2016   ID:  Charles Olson, DOB 02-11-1930, MRN [id number]  PCP:  Charles Lopes, MD  Cardiologist:   Charles Furbish, MD     History of Present Illness:  Charles Olson is a 82 y.o. male former patient of Charles Olson here for follow-up. History of pacemaker secondary to idiopathic AV block implanted around year 2008. Doing well. Has a history of hypertension. [facility] also helps him with hearing aids.  Overall he is doing very well. No syncope. He remembers during the last pacemaker change out feeling funny as the wires are being exchanged and his heart rate paused.  He had a root canal yesterday. Has a temporary on right now. No chest pain, no fevers, no orthopnea, no shortness of breath.  At age 55 he changed jobs, 3 kids at the time. Became "[name]". Enjoyed his work into his mid 5s. 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  St. Jude pacemaker was originally implanted in 2000 for complete heart block. GEN change 2005 and 2014.  His wife is Charles Olson.     Past Medical History:  Diagnosis Date  . AVB (atrioventricular block)    HIGH DEGREE; s/p PPM implantation  . BPH (benign prostatic hypertrophy)   . Chest pain   . Esophageal stricture   . GERD (gastroesophageal reflux disease)   . History of orthostatic hypotension   . Hyperlipidemia   . Sinusitis   . Syncope and collapse     Past Surgical History:  Procedure Laterality Date  . CARDIAC CATHETERIZATION  06/2003   NORMAL CORONARIES  . CHOLECYSTECTOMY    . PACEMAKER GENERATOR CHANGE N/A 01/01/2013   Procedure: PACEMAKER GENERATOR CHANGE;  Surgeon: Charles Grayer, MD;  Location: [facility] CATH LAB;  Service: Cardiovascular;  Laterality: N/A;  . PACEMAKER INSERTION  11/1998; 12/2012   Medtronic [id number] implanted by Charles Olson in 2008; generator change to STJ Assurity 12/2012 by Charles Olson    Current Medications: Outpatient Medications Prior to Visit  Medication Sig  Dispense Refill  . Cholecalciferol (VITAMIN D) 1000 UNITS capsule Take 1,000 Units by mouth daily.     . clobetasol cream (TEMOVATE) AB-123456789 % Apply 1 application topically 2 (two) times daily. Apply to RIGHT foot    . flunisolide (NASAREL) 29 MCG/ACT (0.025%) nasal spray Place 1 spray into the nose daily.    Charles Kitchen ketoconazole (NIZORAL) 2 % cream Apply 1 application topically 2 (two) times daily. Apply to RIGHTleg    . Lysine 500 MG TABS Take 500 mg by mouth daily.     . Multiple Vitamin (MULTIVITAMIN) tablet Take 1 tablet by mouth daily.     . pantoprazole (PROTONIX) 40 MG tablet Take 40 mg by mouth daily.     . simvastatin (ZOCOR) 80 MG tablet Take 40 mg by mouth daily.     No facility-administered medications prior to visit.      Allergies:   Aspirin and Erythromycin   Social History   Social History  . Marital status: Married    Spouse name: N/A  . Number of children: 3  . Years of education: N/A   Occupational History  . Retired    Social History Main Topics  . Smoking status: Never Smoker  . Smokeless tobacco: Never Used  . Alcohol use No  . Drug use: No  . Sexual activity: Not Asked   Other Topics Concern  . None   Social History  Narrative  . None     Family History:  The patient's family history includes Asthma in his mother; CVA in his mother; Heart attack in his brother.   ROS:   Please see the history of present illness.    ROS All other systems reviewed and are negative.   PHYSICAL EXAM:   VS:  BP 136/70   Pulse 60   Ht 5\' 8"  (1.727 m)   Wt 145 lb 9.6 oz (66 kg)   BMI 22.14 kg/m    GEN: Well nourished, well developed, in no acute distress HEENT: normal Neck: no JVD, carotid bruits, or masses Cardiac: RRR; no murmurs, rubs, or gallops,no edema  Respiratory:  clear to auscultation bilaterally, normal work of breathing GI: soft, nontender, nondistended, + BS MS: no deformity or atrophy Skin: warm and dry, no rash Neuro:  Alert and Oriented x 3,  Strength and sensation are intact Psych: euthymic mood, full affect  Wt Readings from Last 3 Encounters:  04/05/16 145 lb 9.6 oz (66 kg)  01/21/16 147 lb 12.8 oz (67 kg)  10/07/15 143 lb 12.8 oz (65.2 kg)      Studies/Labs Reviewed:   EKG:  03/30/15-AV pacing sequential.  Recent Labs: No results found for requested labs within last 8760 hours.   Lipid Panel No results found for: CHOL, TRIG, HDL, CHOLHDL, VLDL, LDLCALC, LDLDIRECT  Additional studies/ records that were reviewed today include:  Immunizations are up-to-date. LDL 77, HDL 50, triglycerides 72, hemoglobin A1c 5.3, hemoglobin 14.4 platelets 183, creatinine 1.1, sodium 142, potassium 4.4, TSH 1.1, ALT 26.  Prior notes reviewed, lab work reviewed, pacemaker reviewed    ASSESSMENT:    1. Complete heart block (Charles Olson)   2. Essential hypertension   3. Cardiac pacemaker in situ   4. Dyslipidemia      PLAN:  In order of problems listed above:  Complete heart block  - Pacemaker placement 2008. Change out 2014. Functioning well. Charles. Rayann Olson. St Jude now.   Hyperlipidemia  - LDL excellent. Continue with the statin, No changes, no myalgias. Charles. Sharlett Olson has been following labs  [place] hypertension  - Usually upon repeat, normal. Overall doing well. No medications.He says "I've gotten over this "  Pacemaker implantation  - He is on third pacemaker. Doing well. He is 100% pacemaker dependent. AV block.  Hearing loss  [facility]. Improved.   Medication Adjustments/Labs and Tests Ordered: Current medicines are reviewed at length with the patient today.  Concerns regarding medicines are outlined above.  Medication changes, Labs and Tests ordered today are listed in the Patient Instructions below. Patient Instructions  Medication Instructions:  The current medical regimen is effective;  continue present plan and medications.  Follow-Up: Follow up in 1 year with Charles Olson.  You will receive a letter in the mail  2 months before you are due.  Please call us when you receive this letter to schedule your follow up appointment.  If you need a refill on your cardiac medications before your next appointment, please call your pharmacy.  Thank you for choosing [facility]!!        Signed, Charles Furbish, MD  04/05/2016 10:18 AM    [address], [place], [place]  [address] Phone: [phone]; Fax: [phone]

## 2016-04-21 ENCOUNTER — Ambulatory Visit (INDEPENDENT_AMBULATORY_CARE_PROVIDER_SITE_OTHER): Payer: Medicare HMO | Admitting: *Deleted

## 2016-04-21 DIAGNOSIS — I442 Atrioventricular block, complete: Secondary | ICD-10-CM

## 2016-04-21 NOTE — Progress Notes (Signed)
Remote pacemaker transmission.   

## 2016-05-03 ENCOUNTER — Encounter: Payer: Self-pay | Admitting: Cardiology

## 2016-06-01 LAB — CUP PACEART REMOTE DEVICE CHECK
Battery Voltage: 2.99 V
Brady Statistic AP VP Percent: 34 %
Brady Statistic RA Percent Paced: 34 %
Date Time Interrogation Session: 20171130092417
Implantable Lead Implant Date: 20000712
Implantable Lead Location: 753859
Implantable Lead Model: 4460
Implantable Lead Serial Number: 200213
Implantable Pulse Generator Implant Date: 20140812
Lead Channel Impedance Value: 400 Ohm
Lead Channel Pacing Threshold Amplitude: 0.5 V
Lead Channel Pacing Threshold Pulse Width: 0.4 ms
Lead Channel Setting Pacing Amplitude: 2.5 V
MDC IDC LEAD IMPLANT DT: 20000712
MDC IDC LEAD LOCATION: 753860
MDC IDC LEAD SERIAL: 20312
MDC IDC MSMT BATTERY REMAINING LONGEVITY: 110 mo
MDC IDC MSMT BATTERY REMAINING PERCENTAGE: 95.5 %
MDC IDC MSMT LEADCHNL RA IMPEDANCE VALUE: 390 Ohm
MDC IDC MSMT LEADCHNL RA PACING THRESHOLD PULSEWIDTH: 0.4 ms
MDC IDC MSMT LEADCHNL RA SENSING INTR AMPL: 3.5 mV
MDC IDC MSMT LEADCHNL RV PACING THRESHOLD AMPLITUDE: 1 V
MDC IDC MSMT LEADCHNL RV SENSING INTR AMPL: 12 mV
MDC IDC SET LEADCHNL RA PACING AMPLITUDE: 2 V
MDC IDC SET LEADCHNL RV PACING PULSEWIDTH: 0.4 ms
MDC IDC SET LEADCHNL RV SENSING SENSITIVITY: 4 mV
MDC IDC STAT BRADY AP VS PERCENT: 1 %
MDC IDC STAT BRADY AS VP PERCENT: 66 %
MDC IDC STAT BRADY AS VS PERCENT: 1 %
MDC IDC STAT BRADY RV PERCENT PACED: 99 %
Pulse Gen Model: 2240
Pulse Gen Serial Number: 2974994

## 2016-06-10 DIAGNOSIS — R972 Elevated prostate specific antigen [PSA]: Secondary | ICD-10-CM | POA: Diagnosis not present

## 2016-06-13 DIAGNOSIS — R310 Gross hematuria: Secondary | ICD-10-CM | POA: Diagnosis not present

## 2016-06-13 DIAGNOSIS — R972 Elevated prostate specific antigen [PSA]: Secondary | ICD-10-CM | POA: Diagnosis not present

## 2016-07-21 ENCOUNTER — Ambulatory Visit (INDEPENDENT_AMBULATORY_CARE_PROVIDER_SITE_OTHER): Payer: Medicare HMO | Admitting: *Deleted

## 2016-07-21 ENCOUNTER — Telehealth: Payer: Self-pay | Admitting: Cardiology

## 2016-07-21 DIAGNOSIS — I442 Atrioventricular block, complete: Secondary | ICD-10-CM | POA: Diagnosis not present

## 2016-07-21 NOTE — Telephone Encounter (Signed)
Confirmed remote transmission w/ pt wife.   

## 2016-07-21 NOTE — Progress Notes (Signed)
Remote pacemaker transmission.   

## 2016-07-26 ENCOUNTER — Encounter: Payer: Self-pay | Admitting: Cardiology

## 2016-07-26 LAB — CUP PACEART REMOTE DEVICE CHECK
Date Time Interrogation Session: 20180306141759
Implantable Lead Implant Date: 20000712
Implantable Lead Model: 4460
Implantable Lead Serial Number: 20312
Implantable Pulse Generator Implant Date: 20140812
Lead Channel Impedance Value: 490 Ohm
Lead Channel Sensing Intrinsic Amplitude: 12 mV
Lead Channel Sensing Intrinsic Amplitude: 4 mV
Lead Channel Setting Pacing Amplitude: 2 V
Lead Channel Setting Pacing Amplitude: 2.5 V
MDC IDC LEAD IMPLANT DT: 20000712
MDC IDC LEAD LOCATION: 753859
MDC IDC LEAD LOCATION: 753860
MDC IDC LEAD SERIAL: 200213
MDC IDC MSMT LEADCHNL RA IMPEDANCE VALUE: 490 Ohm
MDC IDC PG SERIAL: 2974994
MDC IDC SET LEADCHNL RV PACING PULSEWIDTH: 0.4 ms
MDC IDC SET LEADCHNL RV SENSING SENSITIVITY: 4 mV
MDC IDC STAT BRADY RA PERCENT PACED: 30 %
MDC IDC STAT BRADY RV PERCENT PACED: 99 % — AB
Pulse Gen Model: 2240

## 2016-09-21 DIAGNOSIS — X32XXXD Exposure to sunlight, subsequent encounter: Secondary | ICD-10-CM | POA: Diagnosis not present

## 2016-09-21 DIAGNOSIS — L821 Other seborrheic keratosis: Secondary | ICD-10-CM | POA: Diagnosis not present

## 2016-09-21 DIAGNOSIS — Z1283 Encounter for screening for malignant neoplasm of skin: Secondary | ICD-10-CM | POA: Diagnosis not present

## 2016-09-21 DIAGNOSIS — L301 Dyshidrosis [pompholyx]: Secondary | ICD-10-CM | POA: Diagnosis not present

## 2016-09-21 DIAGNOSIS — L570 Actinic keratosis: Secondary | ICD-10-CM | POA: Diagnosis not present

## 2016-10-20 ENCOUNTER — Ambulatory Visit (INDEPENDENT_AMBULATORY_CARE_PROVIDER_SITE_OTHER): Payer: Medicare HMO | Admitting: *Deleted

## 2016-10-20 DIAGNOSIS — I442 Atrioventricular block, complete: Secondary | ICD-10-CM | POA: Diagnosis not present

## 2016-10-21 LAB — CUP PACEART REMOTE DEVICE CHECK
Battery Remaining Longevity: 112 mo
Battery Remaining Percentage: 95.5 %
Brady Statistic AP VP Percent: 31 %
Brady Statistic AP VS Percent: 1 %
Brady Statistic AS VP Percent: 69 %
Brady Statistic RA Percent Paced: 31 %
Brady Statistic RV Percent Paced: 99 %
Implantable Lead Implant Date: 20000712
Implantable Lead Location: 753859
Implantable Lead Model: 4460
Implantable Lead Model: 4461
Implantable Lead Serial Number: 200213
Implantable Lead Serial Number: 20312
Lead Channel Impedance Value: 410 Ohm
Lead Channel Impedance Value: 410 Ohm
Lead Channel Pacing Threshold Amplitude: 0.5 V
Lead Channel Pacing Threshold Amplitude: 1 V
Lead Channel Sensing Intrinsic Amplitude: 12 mV
Lead Channel Setting Pacing Amplitude: 2 V
Lead Channel Setting Pacing Pulse Width: 0.4 ms
Lead Channel Setting Sensing Sensitivity: 4 mV
MDC IDC LEAD IMPLANT DT: 20000712
MDC IDC LEAD LOCATION: 753860
MDC IDC MSMT BATTERY VOLTAGE: 2.99 V
MDC IDC MSMT LEADCHNL RA PACING THRESHOLD PULSEWIDTH: 0.4 ms
MDC IDC MSMT LEADCHNL RA SENSING INTR AMPL: 3.2 mV
MDC IDC MSMT LEADCHNL RV PACING THRESHOLD PULSEWIDTH: 0.4 ms
MDC IDC PG IMPLANT DT: 20140812
MDC IDC PG SERIAL: 2974994
MDC IDC SESS DTM: 20180531080015
MDC IDC SET LEADCHNL RV PACING AMPLITUDE: 2.5 V
MDC IDC STAT BRADY AS VS PERCENT: 1 %

## 2016-10-21 NOTE — Progress Notes (Signed)
Remote pacemaker transmission.   

## 2016-10-28 ENCOUNTER — Encounter: Payer: Self-pay | Admitting: Cardiology

## 2016-11-15 DIAGNOSIS — H40013 Open angle with borderline findings, low risk, bilateral: Secondary | ICD-10-CM | POA: Diagnosis not present

## 2016-11-15 DIAGNOSIS — Z961 Presence of intraocular lens: Secondary | ICD-10-CM | POA: Diagnosis not present

## 2016-11-15 DIAGNOSIS — H43813 Vitreous degeneration, bilateral: Secondary | ICD-10-CM | POA: Diagnosis not present

## 2017-01-17 DIAGNOSIS — Z23 Encounter for immunization: Secondary | ICD-10-CM | POA: Diagnosis not present

## 2017-01-19 ENCOUNTER — Ambulatory Visit (INDEPENDENT_AMBULATORY_CARE_PROVIDER_SITE_OTHER): Payer: Medicare HMO | Admitting: *Deleted

## 2017-01-19 DIAGNOSIS — I442 Atrioventricular block, complete: Secondary | ICD-10-CM

## 2017-01-19 NOTE — Progress Notes (Signed)
Remote pacemaker transmission.   

## 2017-01-31 ENCOUNTER — Encounter: Payer: Self-pay | Admitting: Cardiology

## 2017-02-14 LAB — CUP PACEART REMOTE DEVICE CHECK
Implantable Lead Implant Date: 20000712
Implantable Lead Location: 753860
Implantable Lead Model: 4460
Implantable Lead Model: 4461
Implantable Lead Serial Number: 20312
Implantable Pulse Generator Implant Date: 20140812
MDC IDC LEAD IMPLANT DT: 20000712
MDC IDC LEAD LOCATION: 753859
MDC IDC LEAD SERIAL: 200213
MDC IDC SESS DTM: 20180925080211
Pulse Gen Serial Number: 2974994

## 2017-02-21 DIAGNOSIS — R972 Elevated prostate specific antigen [PSA]: Secondary | ICD-10-CM | POA: Diagnosis not present

## 2017-02-21 DIAGNOSIS — E7849 Other hyperlipidemia: Secondary | ICD-10-CM | POA: Diagnosis not present

## 2017-02-21 DIAGNOSIS — I10 Essential (primary) hypertension: Secondary | ICD-10-CM | POA: Diagnosis not present

## 2017-02-21 DIAGNOSIS — Z0000 Encounter for general adult medical examination without abnormal findings: Secondary | ICD-10-CM | POA: Diagnosis not present

## 2017-02-21 DIAGNOSIS — Z950 Presence of cardiac pacemaker: Secondary | ICD-10-CM | POA: Diagnosis not present

## 2017-02-21 DIAGNOSIS — D329 Benign neoplasm of meninges, unspecified: Secondary | ICD-10-CM | POA: Diagnosis not present

## 2017-02-21 DIAGNOSIS — J302 Other seasonal allergic rhinitis: Secondary | ICD-10-CM | POA: Diagnosis not present

## 2017-02-21 DIAGNOSIS — Z1389 Encounter for screening for other disorder: Secondary | ICD-10-CM | POA: Diagnosis not present

## 2017-02-21 DIAGNOSIS — Z6821 Body mass index (BMI) 21.0-21.9, adult: Secondary | ICD-10-CM | POA: Diagnosis not present

## 2017-02-27 ENCOUNTER — Ambulatory Visit (INDEPENDENT_AMBULATORY_CARE_PROVIDER_SITE_OTHER): Payer: Medicare HMO | Admitting: Internal Medicine

## 2017-02-27 ENCOUNTER — Encounter: Payer: Self-pay | Admitting: Internal Medicine

## 2017-02-27 VITALS — BP 149/79 | HR 58 | Ht 68.0 in | Wt 145.4 lb

## 2017-02-27 DIAGNOSIS — I10 Essential (primary) hypertension: Secondary | ICD-10-CM

## 2017-02-27 DIAGNOSIS — I442 Atrioventricular block, complete: Secondary | ICD-10-CM | POA: Diagnosis not present

## 2017-02-27 LAB — CUP PACEART INCLINIC DEVICE CHECK
Battery Remaining Longevity: 111 mo
Battery Voltage: 2.98 V
Brady Statistic RA Percent Paced: 33 %
Date Time Interrogation Session: 20181008144115
Implantable Lead Model: 4460
Implantable Lead Serial Number: 200213
Implantable Pulse Generator Implant Date: 20140812
Lead Channel Impedance Value: 462.5 Ohm
Lead Channel Pacing Threshold Amplitude: 0.5 V
Lead Channel Pacing Threshold Amplitude: 1 V
Lead Channel Pacing Threshold Pulse Width: 0.4 ms
Lead Channel Pacing Threshold Pulse Width: 0.4 ms
Lead Channel Setting Pacing Amplitude: 2 V
Lead Channel Setting Sensing Sensitivity: 4 mV
MDC IDC LEAD IMPLANT DT: 20000712
MDC IDC LEAD IMPLANT DT: 20000712
MDC IDC LEAD LOCATION: 753859
MDC IDC LEAD LOCATION: 753860
MDC IDC LEAD SERIAL: 20312
MDC IDC MSMT LEADCHNL RA PACING THRESHOLD AMPLITUDE: 0.5 V
MDC IDC MSMT LEADCHNL RA SENSING INTR AMPL: 5 mV
MDC IDC MSMT LEADCHNL RV IMPEDANCE VALUE: 425 Ohm
MDC IDC MSMT LEADCHNL RV PACING THRESHOLD AMPLITUDE: 1 V
MDC IDC MSMT LEADCHNL RV PACING THRESHOLD PULSEWIDTH: 0.4 ms
MDC IDC MSMT LEADCHNL RV PACING THRESHOLD PULSEWIDTH: 0.4 ms
MDC IDC MSMT LEADCHNL RV SENSING INTR AMPL: 12 mV
MDC IDC SET LEADCHNL RV PACING AMPLITUDE: 2.5 V
MDC IDC SET LEADCHNL RV PACING PULSEWIDTH: 0.4 ms
MDC IDC STAT BRADY RV PERCENT PACED: 99.83 %
Pulse Gen Model: 2240
Pulse Gen Serial Number: 2974994

## 2017-02-27 NOTE — Progress Notes (Signed)
    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 male who presents today for routine electrophysiology followup.  Since last being seen in our clinic, the patient reports doing very well.  Remains active.  Today, he denies symptoms of palpitations, chest pain, shortness of breath,  lower extremity edema, dizziness, presyncope, or syncope.  The patient is otherwise without complaint today.   Past Medical History:  Diagnosis Date  . AVB (atrioventricular block)    HIGH DEGREE; s/p PPM implantation  . BPH (benign prostatic hypertrophy)   . Chest pain   . Esophageal stricture   . GERD (gastroesophageal reflux disease)   . History of orthostatic hypotension   . Hyperlipidemia   . Sinusitis   . Syncope and collapse    Past Surgical History:  Procedure Laterality Date  . CARDIAC CATHETERIZATION  06/2003   NORMAL CORONARIES  . CHOLECYSTECTOMY    . PACEMAKER GENERATOR CHANGE N/A 01/01/2013   Procedure: PACEMAKER GENERATOR CHANGE;  Surgeon: Thompson Grayer, MD;  Location: [facility] CATH LAB;  Service: Cardiovascular;  Laterality: N/A;  . PACEMAKER INSERTION  11/1998; 12/2012   Medtronic [id number] implanted by Dr Verlon Setting in 2008; generator change to STJ Assurity 12/2012 by Dr Rayann Heman    ROS- all systems are reviewed and negative except as per HPI above  Current Outpatient Prescriptions  Medication Sig Dispense Refill  . Cholecalciferol (VITAMIN D) 1000 UNITS capsule Take 1,000 Units by mouth daily.     . clobetasol cream (TEMOVATE) 2.87 % Apply 1 application topically 2 (two) times daily. Apply to RIGHT foot    . flunisolide (NASAREL) 29 MCG/ACT (0.025%) nasal spray Place 1 spray into the nose daily.    Marland Kitchen Lysine 500 MG TABS Take 500 mg by mouth daily.     . Multiple Vitamin (MULTIVITAMIN) tablet Take 1 tablet by mouth daily.     . pantoprazole (PROTONIX) 40 MG tablet Take 40 mg by mouth daily.     . simvastatin  (ZOCOR) 80 MG tablet Take 40 mg by mouth daily.     No current facility-administered medications for this visit.     Physical Exam: Vitals:   02/27/17 1029  BP: (!) 149/79  Pulse: (!) 58  Weight: 145 lb 6.4 oz (66 kg)  Height: 5\' 8"  (1.727 m)    GEN- The patient is well appearing, alert and oriented x 3 today.   Head- normocephalic, atraumatic Eyes-  Sclera clear, conjunctiva pink Ears- hearing intact Oropharynx- clear Lungs- Clear to ausculation bilaterally, normal work of breathing Chest- pacemaker pocket is well healed Heart- Regular rate and rhythm, no murmurs, rubs or gallops, PMI not laterally displaced GI- soft, NT, ND, + BS Extremities- no clubbing, cyanosis, or edema  Pacemaker interrogation- reviewed in detail today,  See PACEART report  ekg tracing ordered today is personally reviewed and shows sinus with V pacing  Assessment and Plan:  1. Symptomatic complete heart block Normal pacemaker function See Pace Art report No changes today  2. HTN Stable No change required today  [place] Follow-up with EP NP every year  Thompson Grayer MD, [facility] 02/27/2017 10:43 AM

## 2017-02-27 NOTE — Patient Instructions (Addendum)
Medication Instructions:  Your physician recommends that you continue on your current medications as directed. Please refer to the Current Medication list given to you today.   Labwork: None ordered   Testing/Procedures: None ordered   Follow-Up: Your physician wants you to follow-up in: 12 months with [facility]. You will receive a reminder letter in the mail two months in advance. If you don't receive a letter, please call our office to schedule the follow-up appointment.   Remote monitoring is used to monitor your Pacemaker  from home. This monitoring reduces the number of office visits required to check your device to one time per year. It allows [place] to keep an eye on the functioning of your device to ensure it is working properly. You are scheduled for a device check from home on 04/20/17. You may send your transmission at any time that day. If you have a wireless device, the transmission will be sent automatically. After your physician reviews your transmission, you will receive a postcard with your next transmission date.    Any Other Special Instructions Will Be Listed Below (If Applicable).     If you need a refill on your cardiac medications before your next appointment, please call your pharmacy.

## 2017-04-07 ENCOUNTER — Encounter: Payer: Self-pay | Admitting: Cardiology

## 2017-04-19 ENCOUNTER — Ambulatory Visit: Payer: Medicare HMO | Admitting: Cardiology

## 2017-04-19 ENCOUNTER — Encounter: Payer: Self-pay | Admitting: Cardiology

## 2017-04-19 VITALS — BP 176/80 | HR 60 | Ht 68.0 in | Wt 148.8 lb

## 2017-04-19 DIAGNOSIS — I10 Essential (primary) hypertension: Secondary | ICD-10-CM | POA: Diagnosis not present

## 2017-04-19 DIAGNOSIS — Z950 Presence of cardiac pacemaker: Secondary | ICD-10-CM

## 2017-04-19 DIAGNOSIS — I442 Atrioventricular block, complete: Secondary | ICD-10-CM | POA: Diagnosis not present

## 2017-04-19 DIAGNOSIS — E785 Hyperlipidemia, unspecified: Secondary | ICD-10-CM | POA: Diagnosis not present

## 2017-04-19 NOTE — Progress Notes (Signed)
Cardiology Office Note    Date:  04/19/2017   ID:  Charles Olson, DOB 10-Sep-1929, MRN [id number]  PCP:  Leanna Battles, MD  Cardiologist:   Candee Furbish, MD     History of Present Illness:  Charles Olson is a 83 y.o. male former patient of Dr. Sherryl Barters here for follow-up. History of pacemaker secondary to idiopathic AV block implanted around year 2008. Doing well. Has a history of hypertension. [facility] also helps him with hearing aids.  Overall he is doing very well. No syncope. He remembers during the last pacemaker change out feeling funny as the wires are being exchanged and his heart rate paused.   No chest pain, no fevers, no orthopnea, no shortness of breath.  At age 44 he changed jobs, 3 kids at the time. Became "[name]". Enjoyed his work into his mid 38s. 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  St. Jude pacemaker was originally implanted in 2000 for complete heart block. GEN change 2005 and 2014.  His wife is Boo.   04/19/17-overall been doing very well.  No further dental abscesses, root canals.  Feeling well.  No shortness of breath, no syncope, no bleeding, no orthopnea, no fevers.    Past Medical History:  Diagnosis Date  . AVB (atrioventricular block)    HIGH DEGREE; s/p PPM implantation  . BPH (benign prostatic hypertrophy)   . Chest pain   . Esophageal stricture   . GERD (gastroesophageal reflux disease)   . History of orthostatic hypotension   . Hyperlipidemia   . Sinusitis   . Syncope and collapse     Past Surgical History:  Procedure Laterality Date  . CARDIAC CATHETERIZATION  06/2003   NORMAL CORONARIES  . CHOLECYSTECTOMY    . PACEMAKER GENERATOR CHANGE N/A 01/01/2013   Procedure: PACEMAKER GENERATOR CHANGE;  Surgeon: Thompson Grayer, MD;  Location: [facility] CATH LAB;  Service: Cardiovascular;  Laterality: N/A;  . PACEMAKER INSERTION  11/1998; 12/2012   Medtronic [id number] implanted by Dr Verlon Setting in 2008; generator change to STJ  Assurity 12/2012 by Dr Rayann Heman    Current Medications: Outpatient Medications Prior to Visit  Medication Sig Dispense Refill  . Cholecalciferol (VITAMIN D) 1000 UNITS capsule Take 1,000 Units by mouth daily.     . clobetasol cream (TEMOVATE) 2.13 % Apply 1 application topically 2 (two) times daily. Apply to RIGHT foot    . flunisolide (NASAREL) 29 MCG/ACT (0.025%) nasal spray Place 1 spray into the nose daily.    Marland Kitchen Lysine 500 MG TABS Take 500 mg by mouth daily.     . Multiple Vitamin (MULTIVITAMIN) tablet Take 1 tablet by mouth daily.     . pantoprazole (PROTONIX) 40 MG tablet Take 40 mg by mouth daily.     . simvastatin (ZOCOR) 80 MG tablet Take 40 mg by mouth daily.     No facility-administered medications prior to visit.      Allergies:   Aspirin and Erythromycin   Social History   Socioeconomic History  . Marital status: Married    Spouse name: None  . Number of children: 3  . Years of education: None  . Highest education level: None  Social Needs  . Financial resource strain: None  . Food insecurity - worry: None  . Food insecurity - inability: None  . Transportation needs - medical: None  . Transportation needs - non-medical: None  Occupational History  . Occupation: Retired  Tobacco Use  . Smoking  status: Never Smoker  . Smokeless tobacco: Never Used  Substance and Sexual Activity  . Alcohol use: No  . Drug use: No  . Sexual activity: None  Other Topics Concern  . None  Social History Narrative  . None     Family History:  The patient's family history includes Asthma in his mother; CVA in his mother; Colon cancer in his unknown relative; Heart attack in his brother.   ROS:   Please see the history of present illness.    Review of Systems  All other systems reviewed and are negative.     PHYSICAL EXAM:   VS:  BP (!) 176/80   Pulse 60   Ht 5\' 8"  (1.727 m)   Wt 148 lb 12.8 oz (67.5 kg)   SpO2 96%   BMI 22.62 kg/m    GEN: Well nourished, well  developed, in no acute distress  HEENT: normal  Neck: no JVD, carotid bruits, or masses Cardiac: RRR; no murmurs, rubs, or gallops,no edema  Respiratory:  clear to auscultation bilaterally, normal work of breathing GI: soft, nontender, nondistended, + BS MS: no deformity or atrophy  Skin: warm and dry, no rash Neuro:  Alert and Oriented x 3, Strength and sensation are intact Psych: euthymic mood, full affect    Wt Readings from Last 3 Encounters:  04/19/17 148 lb 12.8 oz (67.5 kg)  02/27/17 145 lb 6.4 oz (66 kg)  04/05/16 145 lb 9.6 oz (66 kg)      Studies/Labs Reviewed:   EKG:  03/30/15-AV pacing sequential.  Recent Labs: No results found for requested labs within last 8760 hours.   Lipid Panel No results found for: CHOL, TRIG, HDL, CHOLHDL, VLDL, LDLCALC, LDLDIRECT  Additional studies/ records that were reviewed today include:  Immunizations are up-to-date.  Total cholesterol 129, triglycerides 61, HDL 64, LDL 53, hemoglobin A1c 5.4, hemoglobin 14.5, creatinine 1.1  Prior notes reviewed, lab work reviewed, pacemaker reviewed    ASSESSMENT:    1. Complete heart block (HCC)   2. Cardiac pacemaker in situ   3. Essential hypertension   4. Dyslipidemia      PLAN:  In order of problems listed above:  Complete heart block  - Pacemaker placement 2008. Change out 2014. Functioning well. Dr. Rayann Heman. St Jude now.  Doing very well again.  Dr. Jackalyn Lombard note reviewed  Hyperlipidemia  - LDL excellent. Continue with the statin, No changes, no myalgias. Dr. Willadean Carol has been following labs  [place] hypertension  - Usually upon repeat, normal. Overall doing well. No medications.good blood pressures at home which have been normal for Dr. Sharlett Iles in the past.  Pacemaker implantation  - He is on third pacemaker. Doing well. He is 100% pacemaker dependent. AV block.  No changes.  Hearing loss  [facility]. Improved.  No changes.   Medication Adjustments/Labs and  Tests Ordered: Current medicines are reviewed at length with the patient today.  Concerns regarding medicines are outlined above.  Medication changes, Labs and Tests ordered today are listed in the Patient Instructions below. Patient Instructions  Medication Instructions:  The current medical regimen is effective;  continue present plan and medications.  Follow-Up: Follow up in 1 year with Dr. Marlou Porch.  You will receive a letter in the mail 2 months before you are due.  Please call us when you receive this letter to schedule your follow up appointment.  If you need a refill on your cardiac medications before your next appointment, please call your  pharmacy.  Thank you for choosing [facility]!!        Signed, Candee Furbish, MD  04/19/2017 10:15 AM    [address], [place], [place]  [address] Phone: [phone]; Fax: [phone]

## 2017-04-19 NOTE — Patient Instructions (Signed)

## 2017-04-20 ENCOUNTER — Ambulatory Visit (INDEPENDENT_AMBULATORY_CARE_PROVIDER_SITE_OTHER): Payer: Medicare HMO | Admitting: *Deleted

## 2017-04-20 DIAGNOSIS — I442 Atrioventricular block, complete: Secondary | ICD-10-CM | POA: Diagnosis not present

## 2017-04-20 NOTE — Progress Notes (Signed)
Remote pacemaker transmission.   

## 2017-04-21 ENCOUNTER — Encounter: Payer: Self-pay | Admitting: Cardiology

## 2017-05-02 LAB — CUP PACEART REMOTE DEVICE CHECK
Battery Remaining Longevity: 109 mo
Battery Remaining Percentage: 95.5 %
Brady Statistic AP VS Percent: 1 %
Brady Statistic AS VP Percent: 66 %
Brady Statistic AS VS Percent: 1 %
Implantable Lead Implant Date: 20000712
Implantable Lead Implant Date: 20000712
Implantable Lead Location: 753859
Implantable Lead Location: 753860
Implantable Lead Model: 4461
Implantable Lead Serial Number: 20312
Lead Channel Impedance Value: 380 Ohm
Lead Channel Pacing Threshold Amplitude: 1 V
Lead Channel Sensing Intrinsic Amplitude: 12 mV
Lead Channel Sensing Intrinsic Amplitude: 2.5 mV
Lead Channel Setting Pacing Amplitude: 2 V
Lead Channel Setting Pacing Pulse Width: 0.4 ms
Lead Channel Setting Sensing Sensitivity: 4 mV
MDC IDC LEAD SERIAL: 200213
MDC IDC MSMT BATTERY VOLTAGE: 2.98 V
MDC IDC MSMT LEADCHNL RA PACING THRESHOLD AMPLITUDE: 0.5 V
MDC IDC MSMT LEADCHNL RA PACING THRESHOLD PULSEWIDTH: 0.4 ms
MDC IDC MSMT LEADCHNL RV IMPEDANCE VALUE: 400 Ohm
MDC IDC MSMT LEADCHNL RV PACING THRESHOLD PULSEWIDTH: 0.4 ms
MDC IDC PG IMPLANT DT: 20140812
MDC IDC PG SERIAL: 2974994
MDC IDC SESS DTM: 20181129090015
MDC IDC SET LEADCHNL RV PACING AMPLITUDE: 2.5 V
MDC IDC STAT BRADY AP VP PERCENT: 34 %
MDC IDC STAT BRADY RA PERCENT PACED: 34 %
MDC IDC STAT BRADY RV PERCENT PACED: 99 %

## 2017-06-28 DIAGNOSIS — N200 Calculus of kidney: Secondary | ICD-10-CM | POA: Diagnosis not present

## 2017-07-20 ENCOUNTER — Ambulatory Visit (INDEPENDENT_AMBULATORY_CARE_PROVIDER_SITE_OTHER): Payer: Medicare HMO | Admitting: *Deleted

## 2017-07-20 DIAGNOSIS — I442 Atrioventricular block, complete: Secondary | ICD-10-CM | POA: Diagnosis not present

## 2017-07-20 NOTE — Progress Notes (Signed)
Remote pacemaker transmission.   

## 2017-07-21 ENCOUNTER — Encounter: Payer: Self-pay | Admitting: Cardiology

## 2017-08-06 LAB — CUP PACEART REMOTE DEVICE CHECK
Brady Statistic AP VP Percent: 33 %
Brady Statistic AP VS Percent: 1 %
Brady Statistic AS VP Percent: 67 %
Brady Statistic AS VS Percent: 1 %
Brady Statistic RV Percent Paced: 99 %
Implantable Lead Location: 753859
Implantable Lead Model: 4460
Implantable Lead Model: 4461
Implantable Lead Serial Number: 200213
Lead Channel Impedance Value: 400 Ohm
Lead Channel Pacing Threshold Amplitude: 0.5 V
Lead Channel Pacing Threshold Amplitude: 1 V
Lead Channel Pacing Threshold Pulse Width: 0.4 ms
Lead Channel Sensing Intrinsic Amplitude: 12 mV
Lead Channel Setting Pacing Amplitude: 2 V
Lead Channel Setting Pacing Amplitude: 2.5 V
Lead Channel Setting Pacing Pulse Width: 0.4 ms
Lead Channel Setting Sensing Sensitivity: 4 mV
MDC IDC LEAD IMPLANT DT: 20000712
MDC IDC LEAD IMPLANT DT: 20000712
MDC IDC LEAD LOCATION: 753860
MDC IDC LEAD SERIAL: 20312
MDC IDC MSMT BATTERY REMAINING LONGEVITY: 109 mo
MDC IDC MSMT BATTERY REMAINING PERCENTAGE: 95.5 %
MDC IDC MSMT BATTERY VOLTAGE: 2.98 V
MDC IDC MSMT LEADCHNL RA IMPEDANCE VALUE: 360 Ohm
MDC IDC MSMT LEADCHNL RA PACING THRESHOLD PULSEWIDTH: 0.4 ms
MDC IDC MSMT LEADCHNL RA SENSING INTR AMPL: 3.1 mV
MDC IDC PG IMPLANT DT: 20140812
MDC IDC PG SERIAL: 2974994
MDC IDC SESS DTM: 20190228124115
MDC IDC STAT BRADY RA PERCENT PACED: 33 %

## 2017-09-20 DIAGNOSIS — Z1283 Encounter for screening for malignant neoplasm of skin: Secondary | ICD-10-CM | POA: Diagnosis not present

## 2017-09-20 DIAGNOSIS — L821 Other seborrheic keratosis: Secondary | ICD-10-CM | POA: Diagnosis not present

## 2017-10-09 DIAGNOSIS — R69 Illness, unspecified: Secondary | ICD-10-CM | POA: Diagnosis not present

## 2017-10-19 ENCOUNTER — Ambulatory Visit (INDEPENDENT_AMBULATORY_CARE_PROVIDER_SITE_OTHER): Payer: Medicare HMO | Admitting: *Deleted

## 2017-10-19 DIAGNOSIS — R001 Bradycardia, unspecified: Secondary | ICD-10-CM

## 2017-10-19 NOTE — Progress Notes (Signed)
Remote pacemaker transmission.   

## 2017-10-23 ENCOUNTER — Encounter: Payer: Self-pay | Admitting: Cardiology

## 2017-10-28 ENCOUNTER — Encounter (HOSPITAL_COMMUNITY): Payer: Self-pay | Admitting: *Deleted

## 2017-10-28 ENCOUNTER — Ambulatory Visit (HOSPITAL_COMMUNITY)
Admission: EM | Admit: 2017-10-28 | Discharge: 2017-10-28 | Disposition: A | Discharge status: Home / Self Care | Payer: Medicare HMO | Attending: Internal Medicine | Admitting: Internal Medicine

## 2017-10-28 ENCOUNTER — Other Ambulatory Visit: Payer: Self-pay

## 2017-10-28 DIAGNOSIS — S30861A Insect bite (nonvenomous) of abdominal wall, initial encounter: Secondary | ICD-10-CM

## 2017-10-28 DIAGNOSIS — R509 Fever, unspecified: Secondary | ICD-10-CM

## 2017-10-28 DIAGNOSIS — W57XXXA Bitten or stung by nonvenomous insect and other nonvenomous arthropods, initial encounter: Secondary | ICD-10-CM | POA: Diagnosis not present

## 2017-10-28 DIAGNOSIS — M791 Myalgia, unspecified site: Secondary | ICD-10-CM

## 2017-10-28 DIAGNOSIS — R5081 Fever presenting with conditions classified elsewhere: Secondary | ICD-10-CM

## 2017-10-28 HISTORY — DX: Presence of cardiac pacemaker: Z95.0

## 2017-10-28 MED ORDER — DOXYCYCLINE HYCLATE 100 MG PO CAPS: 100.0000 mg | ORAL_CAPSULE | Freq: Two times a day (BID) | ORAL | 0 refills | Status: AC

## 2017-10-28 NOTE — ED Provider Notes (Signed)
[place]   [id number] 10/28/17 Arrival Time: 1206  SUBJECTIVE:  Charles Olson is a 84 y.o. male who presents with chills, aches, and poor appetite that began 1 day ago.  States he removed a tick from his abdomen on 10/19/17.  Has NOT tried OTC medications.  Denies aggravating factors.  Denies previous symptoms in the past.   Complains of HA, myalgias, arthralgias, decreased appetite and activity, and nausea.    Denies confusion, neurological symptoms, rash, HA, myalgia, arthralgias, cough, SOB, wheezing, chest pain, nausea, vomiting, abdominal pain, changes in bowel or bladder habits.    Recently finished course of antibiotic for tooth infection.  Does not know the name of the medication.    ROS: As per HPI.  Past Medical History:  Diagnosis Date  . AVB (atrioventricular block)    HIGH DEGREE; s/p PPM implantation  . BPH (benign prostatic hypertrophy)   . Chest pain   . Esophageal stricture   . GERD (gastroesophageal reflux disease)   . History of orthostatic hypotension   . Hyperlipidemia   . Pacemaker   . Sinusitis   . Syncope and collapse    Past Surgical History:  Procedure Laterality Date  . CARDIAC CATHETERIZATION  06/2003   NORMAL CORONARIES  . CHOLECYSTECTOMY    . PACEMAKER GENERATOR CHANGE N/A 01/01/2013   Procedure: PACEMAKER GENERATOR CHANGE;  Surgeon: Thompson Grayer, MD;  Location: [facility] CATH LAB;  Service: Cardiovascular;  Laterality: N/A;  . PACEMAKER INSERTION  11/1998; 12/2012   Medtronic [id number] implanted by Dr Verlon Setting in 2008; generator change to STJ Assurity 12/2012 by Dr Rayann Heman   Allergies  Allergen Reactions  . Aspirin Anaphylaxis and Other (See Comments)    Swells severely internally.  . Erythromycin     Sneezing - Tolerates Azithromycin   No current facility-administered medications on file prior to encounter.    Current Outpatient Medications on File Prior to Encounter  Medication Sig Dispense Refill  . Cholecalciferol (VITAMIN D) 1000 UNITS  capsule Take 1,000 Units by mouth daily.     . clobetasol cream (TEMOVATE) 7.61 % Apply 1 application topically 2 (two) times daily. Apply to RIGHT foot    . flunisolide (NASAREL) 29 MCG/ACT (0.025%) nasal spray Place 1 spray into the nose daily.    Marland Kitchen ketoconazole (NIZORAL) 2 % cream Apply 1 application topically daily.    Marland Kitchen Lysine 500 MG TABS Take 500 mg by mouth daily.     . Multiple Vitamin (MULTIVITAMIN) tablet Take 1 tablet by mouth daily.     . pantoprazole (PROTONIX) 40 MG tablet Take 40 mg by mouth daily.     . simvastatin (ZOCOR) 80 MG tablet Take 40 mg by mouth daily.    Marland Kitchen triamcinolone cream (KENALOG) 0.1 % Apply 1 application topically 2 (two) times daily.      Social History   Socioeconomic History  . Marital status: Married    Spouse name: Not on file  . Number of children: 3  . Years of education: Not on file  . Highest education level: Not on file  Occupational History  . Occupation: Retired  Scientific laboratory technician  . Financial resource strain: Not on file  . Food insecurity:    Worry: Not on file    Inability: Not on file  . Transportation needs:    Medical: Not on file    Non-medical: Not on file  Tobacco Use  . Smoking status: Never Smoker  . Smokeless tobacco: Never Used  Substance and Sexual  Activity  . Alcohol use: No  . Drug use: No  . Sexual activity: Not on file  Lifestyle  . Physical activity:    Days per week: Not on file    Minutes per session: Not on file  . Stress: Not on file  Relationships  . Social connections:    Talks on phone: Not on file    Gets together: Not on file    Attends religious service: Not on file    Active member of club or organization: Not on file    Attends meetings of clubs or organizations: Not on file    Relationship status: Not on file  . Intimate partner violence:    Fear of current or ex partner: Not on file    Emotionally abused: Not on file    Physically abused: Not on file    Forced sexual activity: Not on file    Other Topics Concern  . Not on file  Social History Narrative  . Not on file   Family History  Problem Relation Age of Onset  . Heart attack Brother   . CVA Mother   . Asthma Mother   . Colon cancer Unknown        ?     OBJECTIVE:  Vitals:   10/28/17 1246 10/28/17 1247  BP:  134/72  Pulse: 86   Resp: 16   Temp: 100.3 F (37.9 C)   TempSrc: Oral   SpO2: 98%      General appearance: AOx3 in no acute distress HEENT: PERRL.  EOM grossly intact.  Sinuses nontender; no rhinorrhea; tonsils nonerythematous, uvula midline Neck: cervical LAD Lungs: clear to auscultation bilaterally without adventitious breath sounds Heart: regular rate and rhythm.  Radial pulses 2+ symmetrical bilaterally Abdomen: Normal active bowel sounds; nontender to palpation, no guarding; healing punctuate lesion localized to the right lower abdomen Skin: warm and dry Psychological: alert and cooperative; normal mood and affect  ASSESSMENT & PLAN:  1. Tick bite, initial encounter   2. Fever in other diseases   3. Myalgia     Meds ordered this encounter  Medications  . doxycycline (VIBRAMYCIN) 100 MG capsule    Sig: Take 1 capsule (100 mg total) by mouth 2 (two) times daily for 10 days.    Dispense:  20 capsule    Refill:  0    Order Specific Question:   Supervising Provider    Answer:   Wynona Luna [[id number]]    Rest and drink fluids Prescribed doxycycline.  Take as prescribed and to completion Follow up with PCP next week for reevaluation and to ensure symptoms are improving Return or go to the ER if you have any new or worsening symptoms  Tick Bite Prevention:  To prevent tick bites, wear long sleeves, long pants, and light colors. Use insect repellent. Follow the instructions on the bottle. If the tick is biting, do not try to remove it with heat, alcohol, petroleum jelly, or fingernail polish. Use tweezers, curved forceps, or a tick-removal tool to grasp the tick. Gently pull up  until the tick lets go. Do not twist or jerk the tick. Do not squeeze or crush the tick. Return here or go to ER if you have any new or worsening symptoms (rash, nausea, vomiting, fever, chills, headache, fatigue)   Reviewed expectations re: course of current medical issues. Questions answered. Outlined signs and symptoms indicating need for more acute intervention. Patient verbalized understanding. After Visit Summary given.  Lestine Box, PA-C 10/28/17 1612

## 2017-10-28 NOTE — ED Triage Notes (Signed)
Pt reports having tick bite to right abd on 10/19/17; pt removed tick fully.  C/O starting with chills, aches, poor appetite 2 days ago. Also reports finishing levofloxacin for infection R/T root canal.

## 2017-10-28 NOTE — Discharge Instructions (Addendum)
Rest and drink fluids Prescribed doxycycline.  Take as prescribed and to completion Follow up with PCP next week for reevaluation and to ensure symptoms are improving Return or go to the ER if you have any new or worsening symptoms  Tick Bite Prevention:  To prevent tick bites, wear long sleeves, long pants, and light colors. Use insect repellent. Follow the instructions on the bottle. If the tick is biting, do not try to remove it with heat, alcohol, petroleum jelly, or fingernail polish. Use tweezers, curved forceps, or a tick-removal tool to grasp the tick. Gently pull up until the tick lets go. Do not twist or jerk the tick. Do not squeeze or crush the tick. Return here or go to ER if you have any new or worsening symptoms (rash, nausea, vomiting, fever, chills, headache, fatigue)

## 2017-10-30 DIAGNOSIS — R112 Nausea with vomiting, unspecified: Secondary | ICD-10-CM | POA: Diagnosis not present

## 2017-10-30 DIAGNOSIS — Z6821 Body mass index (BMI) 21.0-21.9, adult: Secondary | ICD-10-CM | POA: Diagnosis not present

## 2017-10-30 DIAGNOSIS — W57XXXA Bitten or stung by nonvenomous insect and other nonvenomous arthropods, initial encounter: Secondary | ICD-10-CM | POA: Diagnosis not present

## 2017-10-30 DIAGNOSIS — R509 Fever, unspecified: Secondary | ICD-10-CM | POA: Diagnosis not present

## 2017-10-31 DIAGNOSIS — R112 Nausea with vomiting, unspecified: Secondary | ICD-10-CM | POA: Diagnosis not present

## 2017-11-06 DIAGNOSIS — R509 Fever, unspecified: Secondary | ICD-10-CM | POA: Diagnosis not present

## 2017-11-08 LAB — CUP PACEART REMOTE DEVICE CHECK
Battery Remaining Percentage: 95.5 %
Battery Voltage: 2.98 V
Brady Statistic AP VS Percent: 1 %
Brady Statistic AS VS Percent: 1 %
Brady Statistic RA Percent Paced: 33 %
Date Time Interrogation Session: 20190530080025
Implantable Lead Implant Date: 20000712
Implantable Lead Location: 753860
Implantable Lead Serial Number: 200213
Implantable Pulse Generator Implant Date: 20140812
Lead Channel Impedance Value: 360 Ohm
Lead Channel Pacing Threshold Amplitude: 0.5 V
Lead Channel Pacing Threshold Amplitude: 1 V
Lead Channel Pacing Threshold Pulse Width: 0.4 ms
Lead Channel Pacing Threshold Pulse Width: 0.4 ms
Lead Channel Sensing Intrinsic Amplitude: 2.7 mV
Lead Channel Setting Pacing Amplitude: 2.5 V
MDC IDC LEAD IMPLANT DT: 20000712
MDC IDC LEAD LOCATION: 753859
MDC IDC LEAD SERIAL: 20312
MDC IDC MSMT BATTERY REMAINING LONGEVITY: 108 mo
MDC IDC MSMT LEADCHNL RA IMPEDANCE VALUE: 360 Ohm
MDC IDC MSMT LEADCHNL RV SENSING INTR AMPL: 12 mV
MDC IDC SET LEADCHNL RA PACING AMPLITUDE: 2 V
MDC IDC SET LEADCHNL RV PACING PULSEWIDTH: 0.4 ms
MDC IDC SET LEADCHNL RV SENSING SENSITIVITY: 4 mV
MDC IDC STAT BRADY AP VP PERCENT: 33 %
MDC IDC STAT BRADY AS VP PERCENT: 67 %
MDC IDC STAT BRADY RV PERCENT PACED: 99 %
Pulse Gen Model: 2240
Pulse Gen Serial Number: 2974994

## 2017-12-26 DIAGNOSIS — H40013 Open angle with borderline findings, low risk, bilateral: Secondary | ICD-10-CM | POA: Diagnosis not present

## 2017-12-26 DIAGNOSIS — H43813 Vitreous degeneration, bilateral: Secondary | ICD-10-CM | POA: Diagnosis not present

## 2017-12-26 DIAGNOSIS — H5213 Myopia, bilateral: Secondary | ICD-10-CM | POA: Diagnosis not present

## 2017-12-26 DIAGNOSIS — Z961 Presence of intraocular lens: Secondary | ICD-10-CM | POA: Diagnosis not present

## 2018-01-18 ENCOUNTER — Ambulatory Visit (INDEPENDENT_AMBULATORY_CARE_PROVIDER_SITE_OTHER): Payer: Medicare HMO | Admitting: *Deleted

## 2018-01-18 DIAGNOSIS — I442 Atrioventricular block, complete: Secondary | ICD-10-CM

## 2018-01-18 DIAGNOSIS — R001 Bradycardia, unspecified: Secondary | ICD-10-CM | POA: Diagnosis not present

## 2018-01-18 NOTE — Progress Notes (Signed)
Remote pacemaker transmission.   

## 2018-02-19 LAB — CUP PACEART REMOTE DEVICE CHECK
Battery Remaining Longevity: 108 mo
Battery Voltage: 2.98 V
Brady Statistic AP VS Percent: 1 %
Brady Statistic AS VP Percent: 67 %
Brady Statistic RA Percent Paced: 33 %
Brady Statistic RV Percent Paced: 99 %
Date Time Interrogation Session: 20190829080013
Implantable Lead Implant Date: 20000712
Implantable Lead Location: 753860
Implantable Lead Model: 4460
Implantable Lead Model: 4461
Implantable Lead Serial Number: 200213
Implantable Pulse Generator Implant Date: 20140812
Lead Channel Impedance Value: 360 Ohm
Lead Channel Pacing Threshold Amplitude: 0.5 V
Lead Channel Pacing Threshold Pulse Width: 0.4 ms
Lead Channel Sensing Intrinsic Amplitude: 12 mV
Lead Channel Setting Pacing Amplitude: 2 V
Lead Channel Setting Pacing Amplitude: 2.5 V
Lead Channel Setting Sensing Sensitivity: 4 mV
MDC IDC LEAD IMPLANT DT: 20000712
MDC IDC LEAD LOCATION: 753859
MDC IDC LEAD SERIAL: 20312
MDC IDC MSMT BATTERY REMAINING PERCENTAGE: 95.5 %
MDC IDC MSMT LEADCHNL RA IMPEDANCE VALUE: 380 Ohm
MDC IDC MSMT LEADCHNL RA SENSING INTR AMPL: 3.3 mV
MDC IDC MSMT LEADCHNL RV PACING THRESHOLD AMPLITUDE: 1 V
MDC IDC MSMT LEADCHNL RV PACING THRESHOLD PULSEWIDTH: 0.4 ms
MDC IDC PG SERIAL: 2974994
MDC IDC SET LEADCHNL RV PACING PULSEWIDTH: 0.4 ms
MDC IDC STAT BRADY AP VP PERCENT: 33 %
MDC IDC STAT BRADY AS VS PERCENT: 1 %

## 2018-02-28 NOTE — Progress Notes (Signed)
Electrophysiology Office Note Date: 03/01/2018  ID:  Charles Olson, DOB [birth year], MRN [id number]  PCP: Leanna Battles, MD Primary Cardiologist: Marlou Porch Electrophysiologist: Allred  CC: Pacemaker follow-up  Charles Olson is a 84 y.o. male seen today for Dr Charles Olson.  He presents today for routine electrophysiology followup.  Since last being seen in our clinic, the patient reports doing very well.  He and his wife are on a waiting list for an apartment at [facility]. 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 He denies chest pain, palpitations, dyspnea, PND, orthopnea, nausea, vomiting, dizziness, syncope, edema, weight gain, or early satiety.  Device History: STJ dual chamber PPM implanted 2000 for complete heart block; gen change 2005; gen change 2014   Past Medical History:  Diagnosis Date  . AVB (atrioventricular block)    HIGH DEGREE; s/p PPM implantation  . BPH (benign prostatic hypertrophy)   . Chest pain   . Esophageal stricture   . GERD (gastroesophageal reflux disease)   . History of orthostatic hypotension   . Hyperlipidemia   . Pacemaker   . Sinusitis   . Syncope and collapse    Past Surgical History:  Procedure Laterality Date  . CARDIAC CATHETERIZATION  06/2003   NORMAL CORONARIES  . CHOLECYSTECTOMY    . PACEMAKER GENERATOR CHANGE N/A 01/01/2013   Procedure: PACEMAKER GENERATOR CHANGE;  Surgeon: Thompson Grayer, MD;  Location: [facility] CATH LAB;  Service: Cardiovascular;  Laterality: N/A;  . PACEMAKER INSERTION  11/1998; 12/2012   Medtronic [id number] implanted by Dr Verlon Setting in 2008; generator change to STJ Assurity 12/2012 by Dr Charles Olson    Current Outpatient Medications  Medication Sig Dispense Refill  . Cholecalciferol (VITAMIN D) 1000 UNITS capsule Take 1,000 Units by mouth daily.     . flunisolide (NASAREL) 29 MCG/ACT (0.025%) nasal spray Place 1 spray into the nose daily.    Marland Kitchen Lysine 500 MG TABS Take 500 mg by mouth daily.     . Multiple Vitamin  (MULTIVITAMIN) tablet Take 1 tablet by mouth daily.     . pantoprazole (PROTONIX) 40 MG tablet Take 40 mg by mouth daily.     . simvastatin (ZOCOR) 80 MG tablet Take 40 mg by mouth daily.     No current facility-administered medications for this visit.     Allergies:   Aspirin and Erythromycin   Social History: Social History   Socioeconomic History  . Marital status: Married    Spouse name: Not on file  . Number of children: 3  . Years of education: Not on file  . Highest education level: Not on file  Occupational History  . Occupation: Retired  Scientific laboratory technician  . Financial resource strain: Not on file  . Food insecurity:    Worry: Not on file    Inability: Not on file  . Transportation needs:    Medical: Not on file    Non-medical: Not on file  Tobacco Use  . Smoking status: Never Smoker  . Smokeless tobacco: Never Used  Substance and Sexual Activity  . Alcohol use: No  . Drug use: No  . Sexual activity: Not on file  Lifestyle  . Physical activity:    Days per week: Not on file    Minutes per session: Not on file  . Stress: Not on file  Relationships  . Social connections:    Talks on phone: Not on file    Gets together: Not on file    Attends religious service:  Not on file    Active member of club or organization: Not on file    Attends meetings of clubs or organizations: Not on file    Relationship status: Not on file  . Intimate partner violence:    Fear of current or ex partner: Not on file    Emotionally abused: Not on file    Physically abused: Not on file    Forced sexual activity: Not on file  Other Topics Concern  . Not on file  Social History Narrative  . Not on file    Family History: Family History  Problem Relation Age of Onset  . Heart attack Brother   . CVA Mother   . Asthma Mother   . Colon cancer Unknown        ?     Review of Systems: All other systems reviewed and are otherwise negative except as noted above.   Physical  Exam: VS:  BP 136/78   Pulse (!) 55   Ht 5\' 10"  (1.778 m)   Wt 146 lb 6.4 oz (66.4 kg)   SpO2 97%   BMI 21.01 kg/m  , BMI Body mass index is 21.01 kg/m.  GEN- The patient is well appearing, alert and oriented x 3 today.   HEENT: normocephalic, atraumatic; sclera clear, conjunctiva pink; hearing intact; oropharynx clear; neck supple  Lungs- Clear to ausculation bilaterally, normal work of breathing.  No wheezes, rales, rhonchi Heart- Regular rate and rhythm (paced) GI- soft, non-tender, non-distended, bowel sounds present  Extremities- no clubbing, cyanosis, or edema  MS- no significant deformity or atrophy Skin- warm and dry, no rash or lesion; PPM pocket well healed Psych- euthymic mood, full affect Neuro- strength and sensation are intact  PPM Interrogation- reviewed in detail today,  See PACEART report  EKG:  EKG is ordered today. The ekg ordered today shows sinus rhythm with V pacing  Recent Labs: No results found for requested labs within last 8760 hours.   Wt Readings from Last 3 Encounters:  03/01/18 146 lb 6.4 oz (66.4 kg)  04/19/17 148 lb 12.8 oz (67.5 kg)  02/27/17 145 lb 6.4 oz (66 kg)     Other studies Reviewed: Additional studies/ records that were reviewed today include: Dr Marlou Porch and Dr Jackalyn Lombard office notes  Assessment and Plan:  1.  Complete heart block Normal PPM function See Pace Art report No changes today  2.  HTN Stable No change required today  Current medicines are reviewed at length with the patient today.   The patient does not have concerns regarding his medicines.  The following changes were made today:  none  Labs/ tests ordered today include: none Orders Placed This Encounter  Procedures  . EKG 12-Lead     Disposition:   Follow up with Merlin, me in 1 year, Dr Marlou Porch as scheduled    Signed, Chanetta Marshall, NP 03/01/2018 10:09 AM  [facility] [facility] [place] [address] [phone]  (office) [phone] (fax)

## 2018-03-01 ENCOUNTER — Ambulatory Visit: Payer: Medicare HMO | Admitting: Nurse Practitioner

## 2018-03-01 ENCOUNTER — Encounter: Payer: Self-pay | Admitting: Nurse Practitioner

## 2018-03-01 VITALS — BP 136/78 | HR 55 | Ht 70.0 in | Wt 146.4 lb

## 2018-03-01 DIAGNOSIS — I442 Atrioventricular block, complete: Secondary | ICD-10-CM

## 2018-03-01 DIAGNOSIS — I10 Essential (primary) hypertension: Secondary | ICD-10-CM | POA: Diagnosis not present

## 2018-03-01 LAB — CUP PACEART INCLINIC DEVICE CHECK
Implantable Lead Implant Date: 20000712
Implantable Lead Location: 753860
Implantable Lead Model: 4461
MDC IDC LEAD IMPLANT DT: 20000712
MDC IDC LEAD LOCATION: 753859
MDC IDC LEAD SERIAL: 200213
MDC IDC LEAD SERIAL: 20312
MDC IDC PG IMPLANT DT: 20140812
MDC IDC SESS DTM: 20191010110934
Pulse Gen Serial Number: 2974994

## 2018-03-01 NOTE — Patient Instructions (Addendum)
Medication Instructions:  Your physician recommends that you continue on your current medications as directed. Please refer to the Current Medication list given to you today.  -- If you need a refill on your cardiac medications before your next appointment, please call your pharmacy. --  Labwork: None ordered  Testing/Procedures: None ordered  Follow-Up: Dr Marlou Porch 04/23/18   Remote monitoring is used to monitor your Pacemaker from home. This monitoring reduces the number of office visits required to check your device to one time per year. It allows [place] to keep an eye on the functioning of your device to ensure it is working properly. You are scheduled for a device check from home on 04/25/18 Delilah Shan). You may send your transmission at any time that day. If you have a wireless device, the transmission will be sent automatically. After your physician reviews your transmission, you will receive a postcard with your next transmission date.   Your physician wants you to follow-up in: 1 year with [place] will receive a reminder letter in the mail two months in advance. If you don't receive a letter, please call our office to schedule the follow-up appointment.  Thank you for choosing [facility]!!    Any Other Special Instructions Will Be Listed Below (If Applicable).

## 2018-03-22 DIAGNOSIS — E7849 Other hyperlipidemia: Secondary | ICD-10-CM | POA: Diagnosis not present

## 2018-03-22 DIAGNOSIS — L989 Disorder of the skin and subcutaneous tissue, unspecified: Secondary | ICD-10-CM | POA: Insufficient documentation

## 2018-03-22 DIAGNOSIS — Z0000 Encounter for general adult medical examination without abnormal findings: Secondary | ICD-10-CM | POA: Diagnosis not present

## 2018-03-22 DIAGNOSIS — H6122 Impacted cerumen, left ear: Secondary | ICD-10-CM | POA: Diagnosis not present

## 2018-03-22 DIAGNOSIS — D329 Benign neoplasm of meninges, unspecified: Secondary | ICD-10-CM | POA: Diagnosis not present

## 2018-03-22 DIAGNOSIS — Z23 Encounter for immunization: Secondary | ICD-10-CM | POA: Diagnosis not present

## 2018-03-22 DIAGNOSIS — Z950 Presence of cardiac pacemaker: Secondary | ICD-10-CM | POA: Diagnosis not present

## 2018-03-22 DIAGNOSIS — R410 Disorientation, unspecified: Secondary | ICD-10-CM | POA: Diagnosis not present

## 2018-03-22 DIAGNOSIS — K219 Gastro-esophageal reflux disease without esophagitis: Secondary | ICD-10-CM | POA: Diagnosis not present

## 2018-03-22 DIAGNOSIS — L988 Other specified disorders of the skin and subcutaneous tissue: Secondary | ICD-10-CM | POA: Diagnosis not present

## 2018-03-22 DIAGNOSIS — I10 Essential (primary) hypertension: Secondary | ICD-10-CM | POA: Diagnosis not present

## 2018-03-28 DIAGNOSIS — L820 Inflamed seborrheic keratosis: Secondary | ICD-10-CM | POA: Diagnosis not present

## 2018-04-02 ENCOUNTER — Encounter: Payer: Self-pay | Admitting: Cardiology

## 2018-04-23 ENCOUNTER — Encounter: Payer: Self-pay | Admitting: Cardiology

## 2018-04-23 ENCOUNTER — Ambulatory Visit: Payer: Medicare HMO | Admitting: Cardiology

## 2018-04-23 VITALS — BP 140/70 | HR 60 | Ht 70.0 in | Wt 146.0 lb

## 2018-04-23 DIAGNOSIS — E785 Hyperlipidemia, unspecified: Secondary | ICD-10-CM | POA: Diagnosis not present

## 2018-04-23 DIAGNOSIS — I442 Atrioventricular block, complete: Secondary | ICD-10-CM

## 2018-04-23 DIAGNOSIS — Z950 Presence of cardiac pacemaker: Secondary | ICD-10-CM

## 2018-04-23 DIAGNOSIS — I10 Essential (primary) hypertension: Secondary | ICD-10-CM | POA: Diagnosis not present

## 2018-04-23 MED ORDER — ATORVASTATIN CALCIUM 40 MG PO TABS: 40.0000 mg | ORAL_TABLET | Freq: Every day | ORAL | 3 refills | Status: DC

## 2018-04-23 NOTE — Progress Notes (Signed)
Cardiology Office Note:    Date:  04/23/2018   ID:  Charles Olson, DOB 01-20-1930, MRN [id number]  PCP:  Leanna Battles, MD  Cardiologist:  Candee Furbish, MD  Electrophysiologist:  None   Referring MD: Leanna Battles, MD     History of Present Illness:    Charles Olson is a 84 y.o. male former patient of Dr. Sherryl Barters here for follow-up of heart block with pacemaker placement in 2008.  Overall has been doing quite well.  No fevers chills nausea vomiting syncope.  He did recall that when he had his pacemaker changed last time he felt funny as the wires were being exchanged and his heart rate paused.  He enjoyed being a "[name] "throughout his 14s.  He worked into his mid 49s.  Singh Jude pacemaker originally implanted in the year 2000 for complete heart block.  GEN change in 2014.  As well as 2005.  His wife goes by [employer].  Had 2 root canals. Has infection on abx. Clindamycin.   Past Medical History:  Diagnosis Date  . AVB (atrioventricular block)    HIGH DEGREE; s/p PPM implantation  . BPH (benign prostatic hypertrophy)   . Chest pain   . Esophageal stricture   . GERD (gastroesophageal reflux disease)   . History of orthostatic hypotension   . Hyperlipidemia   . Pacemaker   . Sinusitis   . Syncope and collapse     Past Surgical History:  Procedure Laterality Date  . CARDIAC CATHETERIZATION  06/2003   NORMAL CORONARIES  . CHOLECYSTECTOMY    . PACEMAKER GENERATOR CHANGE N/A 01/01/2013   Procedure: PACEMAKER GENERATOR CHANGE;  Surgeon: Thompson Grayer, MD;  Location: [facility] CATH LAB;  Service: Cardiovascular;  Laterality: N/A;  . PACEMAKER INSERTION  11/1998; 12/2012   Medtronic [id number] implanted by Dr Verlon Setting in 2008; generator change to STJ Assurity 12/2012 by Dr Rayann Heman    Current Medications: Current Meds  Medication Sig  . Cholecalciferol (VITAMIN D) 1000 UNITS capsule Take 1,000 Units by mouth daily.   . clindamycin (CLEOCIN) 150 MG capsule Take 150 mg by mouth 3  (three) times daily.  . flunisolide (NASAREL) 29 MCG/ACT (0.025%) nasal spray Place 1 spray into the nose daily.  Marland Kitchen Lysine 500 MG TABS Take 500 mg by mouth daily.   . Multiple Vitamin (MULTIVITAMIN) tablet Take 1 tablet by mouth daily.   . pantoprazole (PROTONIX) 40 MG tablet Take 40 mg by mouth daily.   . [DISCONTINUED] simvastatin (ZOCOR) 40 MG tablet Take 40 mg by mouth daily.     Allergies:   Aspirin and Erythromycin   Social History   Socioeconomic History  . Marital status: Married    Spouse name: Not on file  . Number of children: 3  . Years of education: Not on file  . Highest education level: Not on file  Occupational History  . Occupation: Retired  Scientific laboratory technician  . Financial resource strain: Not on file  . Food insecurity:    Worry: Not on file    Inability: Not on file  . Transportation needs:    Medical: Not on file    Non-medical: Not on file  Tobacco Use  . Smoking status: Never Smoker  . Smokeless tobacco: Never Used  Substance and Sexual Activity  . Alcohol use: No  . Drug use: No  . Sexual activity: Not on file  Lifestyle  . Physical activity:    Days per week: Not on file  Minutes per session: Not on file  . Stress: Not on file  Relationships  . Social connections:    Talks on phone: Not on file    Gets together: Not on file    Attends religious service: Not on file    Active member of club or organization: Not on file    Attends meetings of clubs or organizations: Not on file    Relationship status: Not on file  Other Topics Concern  . Not on file  Social History Narrative  . Not on file     Family History: The patient's family history includes Asthma in his mother; CVA in his mother; Colon cancer in his unknown relative; Heart attack in his brother.  ROS:   Please see the history of present illness.     All other systems reviewed and are negative.  EKGs/Labs/Other Studies Reviewed:    The following studies were reviewed today: Prior  office notes lab work EKGs  EKG:  EKG is not ordered today.    Recent Labs: No results found for requested labs within last 8760 hours.  Recent Lipid Panel No results found for: CHOL, TRIG, HDL, CHOLHDL, VLDL, LDLCALC, LDLDIRECT  Physical Exam:    VS:  BP 140/70   Pulse 60   Ht 5\' 10"  (1.778 m)   Wt 146 lb (66.2 kg)   SpO2 96%   BMI 20.95 kg/m     Wt Readings from Last 3 Encounters:  04/23/18 146 lb (66.2 kg)  03/01/18 146 lb 6.4 oz (66.4 kg)  04/19/17 148 lb 12.8 oz (67.5 kg)     GEN:  Well nourished, well developed in no acute distress HEENT: Normal NECK: No JVD; No carotid bruits LYMPHATICS: No lymphadenopathy CARDIAC: RRR, no murmurs, rubs, gallops RESPIRATORY:  Clear to auscultation without rales, wheezing or rhonchi  ABDOMEN: Soft, non-tender, non-distended MUSCULOSKELETAL:  No edema; No deformity  SKIN: Warm and dry NEUROLOGIC:  Alert and oriented x 3 PSYCHIATRIC:  Normal affect   ASSESSMENT:    1. Complete heart block (HCC)   2. Cardiac pacemaker in situ   3. Dyslipidemia   4. Essential hypertension    PLAN:    In order of problems listed above:  Complete heart block - Pacemaker functioning well.  Dr. Rayann Heman, [facility].  Hyperlipidemia - We will go ahead and change him from simvastatin 40 mg over to atorvastatin 40 mg because of potential drug drug interactions with simvastatin.  LDL in the past had been excellent.  Dr. Marcelina Morel has been following his labs.  [place] hypertension - Has been elevated before in the office setting.  Blood pressure has been at goal at home.  Pacemaker implantation -He is on his third pacemaker.  100% pacemaker dependent secondary to complete block.  No changes.  Hearing loss -[facility] hearing aids.  Dental root abscess -Clindamycin.  Root canal.  [place].   Medication Adjustments/Labs and Tests Ordered: Current medicines are reviewed at length with the patient today.  Concerns regarding  medicines are outlined above.  No orders of the defined types were placed in this encounter.  Meds ordered this encounter  Medications  . atorvastatin (LIPITOR) 40 MG tablet    Sig: Take 1 tablet (40 mg total) by mouth daily.    Dispense:  90 tablet    Refill:  3    Patient Instructions  Medication Instructions:  Please discontinue your Simvastatin and start Atorvastatin 40 mg daily. Continue all other medications as listed.  If  you need a refill on your cardiac medications before your next appointment, please call your pharmacy.   Follow-Up: At [facility], you and your health needs are our priority.  As part of our continuing mission to provide you with exceptional heart care, we have created designated Provider Care Teams.  These Care Teams include your primary Cardiologist (physician) and Advanced Practice Providers (APPs -  Physician Assistants and Nurse Practitioners) who all work together to provide you with the care you need, when you need it. You will need a follow up appointment in 12 months.  Please call our office 2 months in advance to schedule this appointment.  You may see Candee Furbish, MD or one of the following Advanced Practice Providers on your designated Care Team:   Truitt Merle, NP Cecilie Kicks, NP . Kathyrn Drown, NP  Thank you for choosing [facility]!!        Signed, Candee Furbish, MD  04/23/2018 9:46 AM    [place]

## 2018-04-23 NOTE — Patient Instructions (Signed)
Medication Instructions:  Please discontinue your Simvastatin and start Atorvastatin 40 mg daily. Continue all other medications as listed.  If you need a refill on your cardiac medications before your next appointment, please call your pharmacy.   Follow-Up: At [facility], you and your health needs are our priority.  As part of our continuing mission to provide you with exceptional heart care, we have created designated Provider Care Teams.  These Care Teams include your primary Cardiologist (physician) and Advanced Practice Providers (APPs -  Physician Assistants and Nurse Practitioners) who all work together to provide you with the care you need, when you need it. You will need a follow up appointment in 12 months.  Please call our office 2 months in advance to schedule this appointment.  You may see Candee Furbish, MD or one of the following Advanced Practice Providers on your designated Care Team:   Truitt Merle, NP Cecilie Kicks, NP . Kathyrn Drown, NP  Thank you for choosing [facility]!!

## 2018-04-25 ENCOUNTER — Ambulatory Visit (INDEPENDENT_AMBULATORY_CARE_PROVIDER_SITE_OTHER): Payer: Medicare HMO

## 2018-04-25 DIAGNOSIS — I442 Atrioventricular block, complete: Secondary | ICD-10-CM | POA: Diagnosis not present

## 2018-04-25 NOTE — Progress Notes (Signed)
Remote pacemaker transmission.   

## 2018-05-01 ENCOUNTER — Encounter: Payer: Self-pay | Admitting: Cardiology

## 2018-06-15 LAB — CUP PACEART REMOTE DEVICE CHECK
Battery Remaining Longevity: 106 mo
Battery Remaining Percentage: 95.5 %
Battery Voltage: 2.96 V
Brady Statistic AP VS Percent: 1 %
Brady Statistic AS VS Percent: 1 %
Date Time Interrogation Session: 20191204070013
Implantable Lead Implant Date: 20000712
Implantable Lead Implant Date: 20000712
Implantable Lead Serial Number: 20312
Implantable Pulse Generator Implant Date: 20140812
Lead Channel Impedance Value: 390 Ohm
Lead Channel Impedance Value: 430 Ohm
Lead Channel Pacing Threshold Amplitude: 0.5 V
Lead Channel Pacing Threshold Pulse Width: 0.4 ms
Lead Channel Sensing Intrinsic Amplitude: 3.4 mV
Lead Channel Setting Pacing Amplitude: 2.5 V
MDC IDC LEAD LOCATION: 753859
MDC IDC LEAD LOCATION: 753860
MDC IDC LEAD SERIAL: 200213
MDC IDC MSMT LEADCHNL RA PACING THRESHOLD PULSEWIDTH: 0.4 ms
MDC IDC MSMT LEADCHNL RV PACING THRESHOLD AMPLITUDE: 1.25 V
MDC IDC MSMT LEADCHNL RV SENSING INTR AMPL: 10.3 mV
MDC IDC SET LEADCHNL RA PACING AMPLITUDE: 2 V
MDC IDC SET LEADCHNL RV PACING PULSEWIDTH: 0.4 ms
MDC IDC SET LEADCHNL RV SENSING SENSITIVITY: 4 mV
MDC IDC STAT BRADY AP VP PERCENT: 36 %
MDC IDC STAT BRADY AS VP PERCENT: 64 %
MDC IDC STAT BRADY RA PERCENT PACED: 36 %
MDC IDC STAT BRADY RV PERCENT PACED: 99 %
Pulse Gen Model: 2240
Pulse Gen Serial Number: 2974994

## 2018-06-26 DIAGNOSIS — H26492 Other secondary cataract, left eye: Secondary | ICD-10-CM | POA: Diagnosis not present

## 2018-06-26 DIAGNOSIS — H40013 Open angle with borderline findings, low risk, bilateral: Secondary | ICD-10-CM | POA: Diagnosis not present

## 2018-07-04 DIAGNOSIS — R310 Gross hematuria: Secondary | ICD-10-CM | POA: Diagnosis not present

## 2018-07-04 DIAGNOSIS — N200 Calculus of kidney: Secondary | ICD-10-CM | POA: Diagnosis not present

## 2018-07-06 DIAGNOSIS — M6281 Muscle weakness (generalized): Secondary | ICD-10-CM | POA: Diagnosis not present

## 2018-07-06 DIAGNOSIS — R2689 Other abnormalities of gait and mobility: Secondary | ICD-10-CM | POA: Diagnosis not present

## 2018-07-06 DIAGNOSIS — M545 Low back pain: Secondary | ICD-10-CM | POA: Diagnosis not present

## 2018-07-06 DIAGNOSIS — R278 Other lack of coordination: Secondary | ICD-10-CM | POA: Diagnosis not present

## 2018-07-25 ENCOUNTER — Ambulatory Visit (INDEPENDENT_AMBULATORY_CARE_PROVIDER_SITE_OTHER): Payer: Medicare HMO | Admitting: *Deleted

## 2018-07-25 DIAGNOSIS — R001 Bradycardia, unspecified: Secondary | ICD-10-CM

## 2018-07-25 DIAGNOSIS — I442 Atrioventricular block, complete: Secondary | ICD-10-CM

## 2018-07-25 LAB — CUP PACEART REMOTE DEVICE CHECK
Battery Remaining Longevity: 104 mo
Battery Remaining Percentage: 95.5 %
Battery Voltage: 2.96 V
Brady Statistic AP VP Percent: 29 %
Brady Statistic AP VS Percent: 1 %
Brady Statistic AS VP Percent: 71 %
Brady Statistic RV Percent Paced: 99 %
Implantable Lead Implant Date: 20000712
Implantable Lead Implant Date: 20000712
Implantable Lead Location: 753859
Implantable Lead Serial Number: 20312
Lead Channel Impedance Value: 390 Ohm
Lead Channel Pacing Threshold Amplitude: 1.25 V
Lead Channel Pacing Threshold Pulse Width: 0.4 ms
Lead Channel Sensing Intrinsic Amplitude: 10.3 mV
Lead Channel Sensing Intrinsic Amplitude: 3.2 mV
Lead Channel Setting Pacing Amplitude: 2 V
Lead Channel Setting Pacing Amplitude: 2.5 V
Lead Channel Setting Pacing Pulse Width: 0.4 ms
MDC IDC LEAD LOCATION: 753860
MDC IDC LEAD SERIAL: 200213
MDC IDC MSMT LEADCHNL RA PACING THRESHOLD AMPLITUDE: 0.5 V
MDC IDC MSMT LEADCHNL RA PACING THRESHOLD PULSEWIDTH: 0.4 ms
MDC IDC MSMT LEADCHNL RV IMPEDANCE VALUE: 390 Ohm
MDC IDC PG IMPLANT DT: 20140812
MDC IDC PG SERIAL: 2974994
MDC IDC SESS DTM: 20200304070012
MDC IDC SET LEADCHNL RV SENSING SENSITIVITY: 4 mV
MDC IDC STAT BRADY AS VS PERCENT: 1 %
MDC IDC STAT BRADY RA PERCENT PACED: 29 %
Pulse Gen Model: 2240

## 2018-08-02 NOTE — Progress Notes (Signed)
Remote pacemaker transmission.   

## 2018-09-19 DIAGNOSIS — L570 Actinic keratosis: Secondary | ICD-10-CM | POA: Diagnosis not present

## 2018-09-19 DIAGNOSIS — Z1283 Encounter for screening for malignant neoplasm of skin: Secondary | ICD-10-CM | POA: Diagnosis not present

## 2018-09-19 DIAGNOSIS — C44622 Squamous cell carcinoma of skin of right upper limb, including shoulder: Secondary | ICD-10-CM | POA: Diagnosis not present

## 2018-09-19 DIAGNOSIS — D225 Melanocytic nevi of trunk: Secondary | ICD-10-CM | POA: Diagnosis not present

## 2018-09-19 DIAGNOSIS — X32XXXD Exposure to sunlight, subsequent encounter: Secondary | ICD-10-CM | POA: Diagnosis not present

## 2018-10-19 DIAGNOSIS — Z85828 Personal history of other malignant neoplasm of skin: Secondary | ICD-10-CM | POA: Diagnosis not present

## 2018-10-19 DIAGNOSIS — Z08 Encounter for follow-up examination after completed treatment for malignant neoplasm: Secondary | ICD-10-CM | POA: Diagnosis not present

## 2018-10-19 DIAGNOSIS — B353 Tinea pedis: Secondary | ICD-10-CM | POA: Diagnosis not present

## 2018-10-19 DIAGNOSIS — C44319 Basal cell carcinoma of skin of other parts of face: Secondary | ICD-10-CM | POA: Diagnosis not present

## 2018-10-24 ENCOUNTER — Ambulatory Visit (INDEPENDENT_AMBULATORY_CARE_PROVIDER_SITE_OTHER): Payer: Medicare HMO | Admitting: *Deleted

## 2018-10-24 DIAGNOSIS — I442 Atrioventricular block, complete: Secondary | ICD-10-CM | POA: Diagnosis not present

## 2018-10-24 LAB — CUP PACEART REMOTE DEVICE CHECK
Battery Remaining Longevity: 103 mo
Battery Remaining Percentage: 95.5 %
Battery Voltage: 2.96 V
Brady Statistic AP VP Percent: 31 %
Brady Statistic AP VS Percent: 1 %
Brady Statistic AS VP Percent: 69 %
Brady Statistic AS VS Percent: 1 %
Brady Statistic RA Percent Paced: 31 %
Brady Statistic RV Percent Paced: 99 %
Date Time Interrogation Session: 20200603080621
Implantable Lead Implant Date: 20000712
Implantable Lead Implant Date: 20000712
Implantable Lead Location: 753859
Implantable Lead Location: 753860
Implantable Lead Model: 4460
Implantable Lead Model: 4461
Implantable Lead Serial Number: 200213
Implantable Lead Serial Number: 20312
Implantable Pulse Generator Implant Date: 20140812
Lead Channel Impedance Value: 360 Ohm
Lead Channel Impedance Value: 360 Ohm
Lead Channel Pacing Threshold Amplitude: 0.5 V
Lead Channel Pacing Threshold Amplitude: 1.25 V
Lead Channel Pacing Threshold Pulse Width: 0.4 ms
Lead Channel Pacing Threshold Pulse Width: 0.4 ms
Lead Channel Sensing Intrinsic Amplitude: 12 mV
Lead Channel Sensing Intrinsic Amplitude: 2.8 mV
Lead Channel Setting Pacing Amplitude: 2 V
Lead Channel Setting Pacing Amplitude: 2.5 V
Lead Channel Setting Pacing Pulse Width: 0.4 ms
Lead Channel Setting Sensing Sensitivity: 4 mV
Pulse Gen Model: 2240
Pulse Gen Serial Number: 2974994

## 2018-11-02 ENCOUNTER — Encounter: Payer: Self-pay | Admitting: Cardiology

## 2018-11-02 NOTE — Progress Notes (Signed)
Remote pacemaker transmission.   

## 2018-11-16 DIAGNOSIS — Z08 Encounter for follow-up examination after completed treatment for malignant neoplasm: Secondary | ICD-10-CM | POA: Diagnosis not present

## 2018-11-16 DIAGNOSIS — C44319 Basal cell carcinoma of skin of other parts of face: Secondary | ICD-10-CM | POA: Diagnosis not present

## 2018-11-16 DIAGNOSIS — Z85828 Personal history of other malignant neoplasm of skin: Secondary | ICD-10-CM | POA: Diagnosis not present

## 2018-12-25 DIAGNOSIS — H40013 Open angle with borderline findings, low risk, bilateral: Secondary | ICD-10-CM | POA: Diagnosis not present

## 2018-12-25 DIAGNOSIS — H26492 Other secondary cataract, left eye: Secondary | ICD-10-CM | POA: Diagnosis not present

## 2019-01-23 ENCOUNTER — Ambulatory Visit (INDEPENDENT_AMBULATORY_CARE_PROVIDER_SITE_OTHER): Payer: Medicare HMO | Admitting: *Deleted

## 2019-01-23 DIAGNOSIS — I442 Atrioventricular block, complete: Secondary | ICD-10-CM

## 2019-01-24 DIAGNOSIS — Z23 Encounter for immunization: Secondary | ICD-10-CM | POA: Diagnosis not present

## 2019-01-24 LAB — CUP PACEART REMOTE DEVICE CHECK
Battery Remaining Longevity: 95 mo
Battery Remaining Percentage: 89 %
Battery Voltage: 2.95 V
Brady Statistic AP VP Percent: 33 %
Brady Statistic AP VS Percent: 1 %
Brady Statistic AS VP Percent: 67 %
Brady Statistic AS VS Percent: 1 %
Brady Statistic RA Percent Paced: 33 %
Brady Statistic RV Percent Paced: 99 %
Date Time Interrogation Session: 20200902060018
Implantable Lead Implant Date: 20000712
Implantable Lead Implant Date: 20000712
Implantable Lead Location: 753859
Implantable Lead Location: 753860
Implantable Lead Model: 4460
Implantable Lead Model: 4461
Implantable Lead Serial Number: 200213
Implantable Lead Serial Number: 20312
Implantable Pulse Generator Implant Date: 20140812
Lead Channel Impedance Value: 360 Ohm
Lead Channel Impedance Value: 360 Ohm
Lead Channel Pacing Threshold Amplitude: 0.5 V
Lead Channel Pacing Threshold Amplitude: 1.25 V
Lead Channel Pacing Threshold Pulse Width: 0.4 ms
Lead Channel Pacing Threshold Pulse Width: 0.4 ms
Lead Channel Sensing Intrinsic Amplitude: 2.9 mV
Lead Channel Sensing Intrinsic Amplitude: 7.2 mV
Lead Channel Setting Pacing Amplitude: 2 V
Lead Channel Setting Pacing Amplitude: 2.5 V
Lead Channel Setting Pacing Pulse Width: 0.4 ms
Lead Channel Setting Sensing Sensitivity: 4 mV
Pulse Gen Model: 2240
Pulse Gen Serial Number: 2974994

## 2019-02-08 NOTE — Progress Notes (Signed)
Remote pacemaker transmission.   

## 2019-03-05 NOTE — Progress Notes (Signed)
Electrophysiology Office Note Date: 03/07/2019  ID:  Charles Olson, DOB Oct 17, 1929, MRN [id number]  PCP: Leanna Battles, MD Primary Cardiologist: Marlou Porch Electrophysiologist: Allred  CC: Pacemaker follow-up  Charles Olson is a 85 y.o. male seen today for Dr Rayann Heman.  He presents today for routine electrophysiology followup.  Since last being seen in our clinic, the patient reports doing very well. He and his wife now live at [facility].  He denies chest pain, palpitations, dyspnea, PND, orthopnea, nausea, vomiting, dizziness, syncope, edema, weight gain, or early satiety.  Device History: STJ dual chamber PPM implanted 2000 for complete heart block; gen change 2005; gen change 2014  Past Medical History:  Diagnosis Date   AVB (atrioventricular block)    HIGH DEGREE; s/p PPM implantation   BPH (benign prostatic hypertrophy)    Chest pain    Esophageal stricture    GERD (gastroesophageal reflux disease)    History of orthostatic hypotension    Hyperlipidemia    Pacemaker    Sinusitis    Syncope and collapse    Past Surgical History:  Procedure Laterality Date   CARDIAC CATHETERIZATION  06/2003   NORMAL CORONARIES   CHOLECYSTECTOMY     PACEMAKER GENERATOR CHANGE N/A 01/01/2013   Procedure: PACEMAKER GENERATOR CHANGE;  Surgeon: Thompson Grayer, MD;  Location: [facility] CATH LAB;  Service: Cardiovascular;  Laterality: N/A;   PACEMAKER INSERTION  11/1998; 12/2012   Medtronic [id number] implanted by Dr Verlon Setting in 2008; generator change to STJ Assurity 12/2012 by Dr Rayann Heman    Current Outpatient Medications  Medication Sig Dispense Refill   atorvastatin (LIPITOR) 40 MG tablet Take 1 tablet (40 mg total) by mouth daily. 90 tablet 3   Cholecalciferol (VITAMIN D) 1000 UNITS capsule Take 1,000 Units by mouth daily.      clindamycin (CLEOCIN) 150 MG capsule Take 150 mg by mouth 3 (three) times daily.     flunisolide (NASAREL) 29 MCG/ACT (0.025%) nasal spray Place 1 spray into  the nose daily.     Lysine 500 MG TABS Take 500 mg by mouth daily.      Multiple Vitamin (MULTIVITAMIN) tablet Take 1 tablet by mouth daily.      pantoprazole (PROTONIX) 40 MG tablet Take 40 mg by mouth daily.      No current facility-administered medications for this visit.     Allergies:   Aspirin and Erythromycin   Social History: Social History   Socioeconomic History   Marital status: Married    Spouse name: Not on file   Number of children: 3   Years of education: Not on file   Highest education level: Not on file  Occupational History   Occupation: Retired  Scientist, product/process development strain: Not on file   Food insecurity    Worry: Not on file    Inability: Not on Lexicographer needs    Medical: Not on file    Non-medical: Not on file  Tobacco Use   Smoking status: Never Smoker   Smokeless tobacco: Never Used  Substance and Sexual Activity   Alcohol use: No   Drug use: No   Sexual activity: Not on file  Lifestyle   Physical activity    Days per week: Not on file    Minutes per session: Not on file   Stress: Not on file  Relationships   Social connections    Talks on phone: Not on file    Gets together: Not on file  Attends religious service: Not on file    Active member of club or organization: Not on file    Attends meetings of clubs or organizations: Not on file    Relationship status: Not on file   Intimate partner violence    Fear of current or ex partner: Not on file    Emotionally abused: Not on file    Physically abused: Not on file    Forced sexual activity: Not on file  Other Topics Concern   Not on file  Social History Narrative   Not on file    Family History: Family History  Problem Relation Age of Onset   Heart attack Brother    CVA Mother    Asthma Mother    Colon cancer Unknown        ?     Review of Systems: All other systems reviewed and are otherwise negative except as noted  above.   Physical Exam: VS:  BP 136/70    Pulse 63    Ht 5\' 10"  (1.778 m)    Wt 147 lb (66.7 kg)    SpO2 98%    BMI 21.09 kg/m  , BMI Body mass index is 21.09 kg/m.  GEN- The patient is well appearing, alert and oriented x 3 today.   HEENT: normocephalic, atraumatic; sclera clear, conjunctiva pink; hearing intact; oropharynx clear; neck supple  Lungs- Clear to ausculation bilaterally, normal work of breathing.  No wheezes, rales, rhonchi Heart- Regular rate and rhythm  GI- soft, non-tender, non-distended, bowel sounds present  Extremities- no clubbing, cyanosis, or edema  MS- no significant deformity or atrophy Skin- warm and dry, no rash or lesion; PPM pocket well healed Psych- euthymic mood, full affect Neuro- strength and sensation are intact  PPM Interrogation- reviewed in detail today,  See PACEART report  EKG:  EKG is not ordered today.  Recent Labs: No results found for requested labs within last 8760 hours.   Wt Readings from Last 3 Encounters:  03/07/19 147 lb (66.7 kg)  04/23/18 146 lb (66.2 kg)  03/01/18 146 lb 6.4 oz (66.4 kg)     Other studies Reviewed: Additional studies/ records that were reviewed today include: Dr Marlou Porch' office notes   Assessment and Plan:  1.  Complete heart block Normal PPM function See Pace Art report No changes today  2.  HTN Stable No change required today    Current medicines are reviewed at length with the patient today.   The patient does not have concerns regarding his medicines.  The following changes were made today:  none  Labs/ tests ordered today include: none No orders of the defined types were placed in this encounter.    Disposition:   Follow up with Merlin, me in 1 year   Signed, Chanetta Marshall, NP 03/07/2019 11:01 AM  [facility] [address] [place] [address] [phone] (office) [phone] (fax)

## 2019-03-07 ENCOUNTER — Encounter: Payer: Self-pay | Admitting: Nurse Practitioner

## 2019-03-07 ENCOUNTER — Other Ambulatory Visit: Payer: Self-pay

## 2019-03-07 ENCOUNTER — Ambulatory Visit: Payer: Medicare HMO | Admitting: Nurse Practitioner

## 2019-03-07 VITALS — BP 136/70 | HR 63 | Ht 70.0 in | Wt 147.0 lb

## 2019-03-07 DIAGNOSIS — I442 Atrioventricular block, complete: Secondary | ICD-10-CM

## 2019-03-07 DIAGNOSIS — I10 Essential (primary) hypertension: Secondary | ICD-10-CM

## 2019-03-07 NOTE — Patient Instructions (Signed)
Medication Instructions:  none *If you need a refill on your cardiac medications before your next appointment, please call your pharmacy*  Lab Work: none If you have labs (blood work) drawn today and your tests are completely normal, you will receive your results only by: Marland Kitchen MyChart Message (if you have MyChart) OR . A paper copy in the mail If you have any lab test that is abnormal or we need to change your treatment, we will call you to review the results.  Testing/Procedures: none  Follow-Up: At [facility], you and your health needs are our priority.  As part of our continuing mission to provide you with exceptional heart care, we have created designated Provider Care Teams.  These Care Teams include your primary Cardiologist (physician) and Advanced Practice Providers (APPs -  Physician Assistants and Nurse Practitioners) who all work together to provide you with the care you need, when you need it.  Your next appointment:   12 months  The format for your next appointment:   In Person  Provider:   Chanetta Marshall, PA  Other Instructions Remote monitoring is used to monitor your Pacemaker  from home. This monitoring reduces the number of office visits required to check your device to one time per year. It allows [place] to keep an eye on the functioning of your device to ensure it is working properly. You are scheduled for a device check from home on 04/24/19. You may send your transmission at any time that day. If you have a wireless device, the transmission will be sent automatically. After your physician reviews your transmission, you will receive a postcard with your next transmission date.

## 2019-03-08 DIAGNOSIS — Z85828 Personal history of other malignant neoplasm of skin: Secondary | ICD-10-CM | POA: Diagnosis not present

## 2019-03-08 DIAGNOSIS — Z08 Encounter for follow-up examination after completed treatment for malignant neoplasm: Secondary | ICD-10-CM | POA: Diagnosis not present

## 2019-03-08 DIAGNOSIS — X32XXXD Exposure to sunlight, subsequent encounter: Secondary | ICD-10-CM | POA: Diagnosis not present

## 2019-03-08 DIAGNOSIS — L570 Actinic keratosis: Secondary | ICD-10-CM | POA: Diagnosis not present

## 2019-04-02 DIAGNOSIS — Z950 Presence of cardiac pacemaker: Secondary | ICD-10-CM | POA: Diagnosis not present

## 2019-04-02 DIAGNOSIS — Z1331 Encounter for screening for depression: Secondary | ICD-10-CM | POA: Diagnosis not present

## 2019-04-02 DIAGNOSIS — E785 Hyperlipidemia, unspecified: Secondary | ICD-10-CM | POA: Diagnosis not present

## 2019-04-02 DIAGNOSIS — Z0000 Encounter for general adult medical examination without abnormal findings: Secondary | ICD-10-CM | POA: Diagnosis not present

## 2019-04-02 DIAGNOSIS — K219 Gastro-esophageal reflux disease without esophagitis: Secondary | ICD-10-CM | POA: Diagnosis not present

## 2019-04-02 DIAGNOSIS — N200 Calculus of kidney: Secondary | ICD-10-CM | POA: Diagnosis not present

## 2019-04-02 DIAGNOSIS — I10 Essential (primary) hypertension: Secondary | ICD-10-CM | POA: Diagnosis not present

## 2019-04-02 DIAGNOSIS — D329 Benign neoplasm of meninges, unspecified: Secondary | ICD-10-CM | POA: Diagnosis not present

## 2019-04-02 DIAGNOSIS — Z1389 Encounter for screening for other disorder: Secondary | ICD-10-CM | POA: Diagnosis not present

## 2019-04-24 ENCOUNTER — Ambulatory Visit (INDEPENDENT_AMBULATORY_CARE_PROVIDER_SITE_OTHER): Payer: Medicare HMO | Admitting: *Deleted

## 2019-04-24 DIAGNOSIS — I442 Atrioventricular block, complete: Secondary | ICD-10-CM | POA: Diagnosis not present

## 2019-04-24 LAB — CUP PACEART REMOTE DEVICE CHECK
Battery Remaining Longevity: 97 mo
Battery Remaining Percentage: 89 %
Battery Voltage: 2.95 V
Brady Statistic AP VP Percent: 31 %
Brady Statistic AP VS Percent: 1 %
Brady Statistic AS VP Percent: 69 %
Brady Statistic AS VS Percent: 1 %
Brady Statistic RA Percent Paced: 31 %
Brady Statistic RV Percent Paced: 99 %
Date Time Interrogation Session: 20201202020014
Implantable Lead Implant Date: 20000712
Implantable Lead Implant Date: 20000712
Implantable Lead Location: 753859
Implantable Lead Location: 753860
Implantable Lead Model: 4460
Implantable Lead Model: 4461
Implantable Lead Serial Number: 200213
Implantable Lead Serial Number: 20312
Implantable Pulse Generator Implant Date: 20140812
Lead Channel Impedance Value: 380 Ohm
Lead Channel Impedance Value: 400 Ohm
Lead Channel Pacing Threshold Amplitude: 0.5 V
Lead Channel Pacing Threshold Amplitude: 1 V
Lead Channel Pacing Threshold Pulse Width: 0.4 ms
Lead Channel Pacing Threshold Pulse Width: 0.4 ms
Lead Channel Sensing Intrinsic Amplitude: 3.2 mV
Lead Channel Sensing Intrinsic Amplitude: 7.2 mV
Lead Channel Setting Pacing Amplitude: 2 V
Lead Channel Setting Pacing Amplitude: 2.5 V
Lead Channel Setting Pacing Pulse Width: 0.4 ms
Lead Channel Setting Sensing Sensitivity: 4 mV
Pulse Gen Model: 2240
Pulse Gen Serial Number: 2974994

## 2019-04-27 ENCOUNTER — Other Ambulatory Visit: Payer: Self-pay | Admitting: Cardiology

## 2019-04-27 DIAGNOSIS — R2689 Other abnormalities of gait and mobility: Secondary | ICD-10-CM | POA: Diagnosis not present

## 2019-04-27 DIAGNOSIS — R278 Other lack of coordination: Secondary | ICD-10-CM | POA: Diagnosis not present

## 2019-04-27 DIAGNOSIS — M545 Low back pain: Secondary | ICD-10-CM | POA: Diagnosis not present

## 2019-04-27 DIAGNOSIS — M6281 Muscle weakness (generalized): Secondary | ICD-10-CM | POA: Diagnosis not present

## 2019-04-29 DIAGNOSIS — R278 Other lack of coordination: Secondary | ICD-10-CM | POA: Diagnosis not present

## 2019-04-29 DIAGNOSIS — R2689 Other abnormalities of gait and mobility: Secondary | ICD-10-CM | POA: Diagnosis not present

## 2019-04-29 DIAGNOSIS — M6281 Muscle weakness (generalized): Secondary | ICD-10-CM | POA: Diagnosis not present

## 2019-04-29 DIAGNOSIS — M545 Low back pain: Secondary | ICD-10-CM | POA: Diagnosis not present

## 2019-05-01 DIAGNOSIS — M6281 Muscle weakness (generalized): Secondary | ICD-10-CM | POA: Diagnosis not present

## 2019-05-01 DIAGNOSIS — M545 Low back pain: Secondary | ICD-10-CM | POA: Diagnosis not present

## 2019-05-01 DIAGNOSIS — R2689 Other abnormalities of gait and mobility: Secondary | ICD-10-CM | POA: Diagnosis not present

## 2019-05-01 DIAGNOSIS — R278 Other lack of coordination: Secondary | ICD-10-CM | POA: Diagnosis not present

## 2019-05-02 DIAGNOSIS — R2689 Other abnormalities of gait and mobility: Secondary | ICD-10-CM | POA: Diagnosis not present

## 2019-05-02 DIAGNOSIS — M6281 Muscle weakness (generalized): Secondary | ICD-10-CM | POA: Diagnosis not present

## 2019-05-02 DIAGNOSIS — R278 Other lack of coordination: Secondary | ICD-10-CM | POA: Diagnosis not present

## 2019-05-02 DIAGNOSIS — M545 Low back pain: Secondary | ICD-10-CM | POA: Diagnosis not present

## 2019-05-03 DIAGNOSIS — R278 Other lack of coordination: Secondary | ICD-10-CM | POA: Diagnosis not present

## 2019-05-03 DIAGNOSIS — M545 Low back pain: Secondary | ICD-10-CM | POA: Diagnosis not present

## 2019-05-03 DIAGNOSIS — R2689 Other abnormalities of gait and mobility: Secondary | ICD-10-CM | POA: Diagnosis not present

## 2019-05-03 DIAGNOSIS — M6281 Muscle weakness (generalized): Secondary | ICD-10-CM | POA: Diagnosis not present

## 2019-05-06 DIAGNOSIS — M6281 Muscle weakness (generalized): Secondary | ICD-10-CM | POA: Diagnosis not present

## 2019-05-06 DIAGNOSIS — R2689 Other abnormalities of gait and mobility: Secondary | ICD-10-CM | POA: Diagnosis not present

## 2019-05-06 DIAGNOSIS — R278 Other lack of coordination: Secondary | ICD-10-CM | POA: Diagnosis not present

## 2019-05-06 DIAGNOSIS — M545 Low back pain: Secondary | ICD-10-CM | POA: Diagnosis not present

## 2019-05-08 DIAGNOSIS — M6281 Muscle weakness (generalized): Secondary | ICD-10-CM | POA: Diagnosis not present

## 2019-05-08 DIAGNOSIS — R2689 Other abnormalities of gait and mobility: Secondary | ICD-10-CM | POA: Diagnosis not present

## 2019-05-08 DIAGNOSIS — M545 Low back pain: Secondary | ICD-10-CM | POA: Diagnosis not present

## 2019-05-08 DIAGNOSIS — R278 Other lack of coordination: Secondary | ICD-10-CM | POA: Diagnosis not present

## 2019-05-09 DIAGNOSIS — R278 Other lack of coordination: Secondary | ICD-10-CM | POA: Diagnosis not present

## 2019-05-09 DIAGNOSIS — M545 Low back pain: Secondary | ICD-10-CM | POA: Diagnosis not present

## 2019-05-09 DIAGNOSIS — M6281 Muscle weakness (generalized): Secondary | ICD-10-CM | POA: Diagnosis not present

## 2019-05-09 DIAGNOSIS — R2689 Other abnormalities of gait and mobility: Secondary | ICD-10-CM | POA: Diagnosis not present

## 2019-05-10 DIAGNOSIS — M545 Low back pain: Secondary | ICD-10-CM | POA: Diagnosis not present

## 2019-05-10 DIAGNOSIS — R2689 Other abnormalities of gait and mobility: Secondary | ICD-10-CM | POA: Diagnosis not present

## 2019-05-10 DIAGNOSIS — M6281 Muscle weakness (generalized): Secondary | ICD-10-CM | POA: Diagnosis not present

## 2019-05-10 DIAGNOSIS — R278 Other lack of coordination: Secondary | ICD-10-CM | POA: Diagnosis not present

## 2019-05-13 DIAGNOSIS — R278 Other lack of coordination: Secondary | ICD-10-CM | POA: Diagnosis not present

## 2019-05-13 DIAGNOSIS — M545 Low back pain: Secondary | ICD-10-CM | POA: Diagnosis not present

## 2019-05-13 DIAGNOSIS — R2689 Other abnormalities of gait and mobility: Secondary | ICD-10-CM | POA: Diagnosis not present

## 2019-05-13 DIAGNOSIS — M6281 Muscle weakness (generalized): Secondary | ICD-10-CM | POA: Diagnosis not present

## 2019-05-14 DIAGNOSIS — M6281 Muscle weakness (generalized): Secondary | ICD-10-CM | POA: Diagnosis not present

## 2019-05-14 DIAGNOSIS — R2689 Other abnormalities of gait and mobility: Secondary | ICD-10-CM | POA: Diagnosis not present

## 2019-05-14 DIAGNOSIS — M545 Low back pain: Secondary | ICD-10-CM | POA: Diagnosis not present

## 2019-05-14 DIAGNOSIS — R278 Other lack of coordination: Secondary | ICD-10-CM | POA: Diagnosis not present

## 2019-05-15 DIAGNOSIS — R278 Other lack of coordination: Secondary | ICD-10-CM | POA: Diagnosis not present

## 2019-05-15 DIAGNOSIS — M6281 Muscle weakness (generalized): Secondary | ICD-10-CM | POA: Diagnosis not present

## 2019-05-15 DIAGNOSIS — R2689 Other abnormalities of gait and mobility: Secondary | ICD-10-CM | POA: Diagnosis not present

## 2019-05-15 DIAGNOSIS — M545 Low back pain: Secondary | ICD-10-CM | POA: Diagnosis not present

## 2019-05-16 DIAGNOSIS — R2689 Other abnormalities of gait and mobility: Secondary | ICD-10-CM | POA: Diagnosis not present

## 2019-05-16 DIAGNOSIS — M6281 Muscle weakness (generalized): Secondary | ICD-10-CM | POA: Diagnosis not present

## 2019-05-16 DIAGNOSIS — M545 Low back pain: Secondary | ICD-10-CM | POA: Diagnosis not present

## 2019-05-16 DIAGNOSIS — R278 Other lack of coordination: Secondary | ICD-10-CM | POA: Diagnosis not present

## 2019-05-20 DIAGNOSIS — M545 Low back pain: Secondary | ICD-10-CM | POA: Diagnosis not present

## 2019-05-20 DIAGNOSIS — R278 Other lack of coordination: Secondary | ICD-10-CM | POA: Diagnosis not present

## 2019-05-20 DIAGNOSIS — R2689 Other abnormalities of gait and mobility: Secondary | ICD-10-CM | POA: Diagnosis not present

## 2019-05-20 DIAGNOSIS — M6281 Muscle weakness (generalized): Secondary | ICD-10-CM | POA: Diagnosis not present

## 2019-05-20 NOTE — Progress Notes (Signed)
PPM Remote  

## 2019-05-21 DIAGNOSIS — M6281 Muscle weakness (generalized): Secondary | ICD-10-CM | POA: Diagnosis not present

## 2019-05-21 DIAGNOSIS — R2689 Other abnormalities of gait and mobility: Secondary | ICD-10-CM | POA: Diagnosis not present

## 2019-05-21 DIAGNOSIS — R278 Other lack of coordination: Secondary | ICD-10-CM | POA: Diagnosis not present

## 2019-05-21 DIAGNOSIS — M545 Low back pain: Secondary | ICD-10-CM | POA: Diagnosis not present

## 2019-07-02 DIAGNOSIS — R69 Illness, unspecified: Secondary | ICD-10-CM | POA: Diagnosis not present

## 2019-07-08 DIAGNOSIS — N200 Calculus of kidney: Secondary | ICD-10-CM | POA: Diagnosis not present

## 2019-07-08 DIAGNOSIS — R310 Gross hematuria: Secondary | ICD-10-CM | POA: Diagnosis not present

## 2019-07-08 DIAGNOSIS — N401 Enlarged prostate with lower urinary tract symptoms: Secondary | ICD-10-CM | POA: Diagnosis not present

## 2019-07-08 DIAGNOSIS — N3942 Incontinence without sensory awareness: Secondary | ICD-10-CM | POA: Diagnosis not present

## 2019-07-24 ENCOUNTER — Ambulatory Visit (INDEPENDENT_AMBULATORY_CARE_PROVIDER_SITE_OTHER): Payer: Medicare HMO | Admitting: *Deleted

## 2019-07-24 DIAGNOSIS — I442 Atrioventricular block, complete: Secondary | ICD-10-CM

## 2019-07-24 LAB — CUP PACEART REMOTE DEVICE CHECK
Battery Remaining Longevity: 96 mo
Battery Remaining Percentage: 89 %
Battery Voltage: 2.95 V
Brady Statistic AP VP Percent: 31 %
Brady Statistic AP VS Percent: 1 %
Brady Statistic AS VP Percent: 69 %
Brady Statistic AS VS Percent: 1 %
Brady Statistic RA Percent Paced: 31 %
Brady Statistic RV Percent Paced: 99 %
Date Time Interrogation Session: 20210303020025
Implantable Lead Implant Date: 20000712
Implantable Lead Implant Date: 20000712
Implantable Lead Location: 753859
Implantable Lead Location: 753860
Implantable Lead Model: 4460
Implantable Lead Model: 4461
Implantable Lead Serial Number: 200213
Implantable Lead Serial Number: 20312
Implantable Pulse Generator Implant Date: 20140812
Lead Channel Impedance Value: 360 Ohm
Lead Channel Impedance Value: 390 Ohm
Lead Channel Pacing Threshold Amplitude: 0.5 V
Lead Channel Pacing Threshold Amplitude: 1 V
Lead Channel Pacing Threshold Pulse Width: 0.4 ms
Lead Channel Pacing Threshold Pulse Width: 0.4 ms
Lead Channel Sensing Intrinsic Amplitude: 11.7 mV
Lead Channel Sensing Intrinsic Amplitude: 2.8 mV
Lead Channel Setting Pacing Amplitude: 2 V
Lead Channel Setting Pacing Amplitude: 2.5 V
Lead Channel Setting Pacing Pulse Width: 0.4 ms
Lead Channel Setting Sensing Sensitivity: 4 mV
Pulse Gen Model: 2240
Pulse Gen Serial Number: 2974994

## 2019-07-24 NOTE — Progress Notes (Signed)
PPM Remote  

## 2019-09-06 DIAGNOSIS — L570 Actinic keratosis: Secondary | ICD-10-CM | POA: Diagnosis not present

## 2019-09-06 DIAGNOSIS — X32XXXD Exposure to sunlight, subsequent encounter: Secondary | ICD-10-CM | POA: Diagnosis not present

## 2019-09-13 DIAGNOSIS — L570 Actinic keratosis: Secondary | ICD-10-CM | POA: Diagnosis not present

## 2019-09-13 DIAGNOSIS — X32XXXD Exposure to sunlight, subsequent encounter: Secondary | ICD-10-CM | POA: Diagnosis not present

## 2019-09-18 ENCOUNTER — Encounter: Payer: Self-pay | Admitting: Cardiology

## 2019-09-18 ENCOUNTER — Other Ambulatory Visit: Payer: Self-pay

## 2019-09-18 ENCOUNTER — Ambulatory Visit: Payer: Medicare HMO | Admitting: Cardiology

## 2019-09-18 VITALS — BP 112/70 | HR 56 | Ht 70.0 in | Wt 148.8 lb

## 2019-09-18 DIAGNOSIS — E785 Hyperlipidemia, unspecified: Secondary | ICD-10-CM

## 2019-09-18 DIAGNOSIS — I10 Essential (primary) hypertension: Secondary | ICD-10-CM

## 2019-09-18 DIAGNOSIS — I442 Atrioventricular block, complete: Secondary | ICD-10-CM | POA: Diagnosis not present

## 2019-09-18 DIAGNOSIS — Z950 Presence of cardiac pacemaker: Secondary | ICD-10-CM

## 2019-09-18 NOTE — Progress Notes (Signed)
Cardiology Office Note:    Date:  09/18/2019   ID:  Charles Olson, DOB [birth year], MRN [id number]  PCP:  Leanna Battles, MD  Cardiologist:  Candee Furbish, MD  Electrophysiologist:  None   Referring MD: Leanna Battles, MD     History of Present Illness:    Charles Olson is a 86 y.o. male here for the follow-up of complete heart block.  Has been seen by electrophysiology last.  [facility] dual-chamber pacemaker implanted in 2000 with generator change in 2014.  He and his wife live at friend's home.  Former patient of Dr. Sherryl Barters.  He did recall previously that when he had his pacemaker changed he felt funny as the wires are being exchanged and his heart rate paused.  His wife goes by [employer]. He enjoyed being "[name] "throughout his 34s.  Worked into his mid 75s.  Overall he is feeling well without any fevers chills nausea vomiting syncope bleeding.  He unfortunately has had several people die around him at friend's home just from old age she states.  16 in the last several months.  Past Medical History:  Diagnosis Date  . AVB (atrioventricular block)    HIGH DEGREE; s/p PPM implantation  . BPH (benign prostatic hypertrophy)   . Chest pain   . Esophageal stricture   . GERD (gastroesophageal reflux disease)   . History of orthostatic hypotension   . Hyperlipidemia   . Pacemaker   . Sinusitis   . Syncope and collapse     Past Surgical History:  Procedure Laterality Date  . CARDIAC CATHETERIZATION  06/2003   NORMAL CORONARIES  . CHOLECYSTECTOMY    . PACEMAKER GENERATOR CHANGE N/A 01/01/2013   Procedure: PACEMAKER GENERATOR CHANGE;  Surgeon: Thompson Grayer, MD;  Location: [facility] CATH LAB;  Service: Cardiovascular;  Laterality: N/A;  . PACEMAKER INSERTION  11/1998; 12/2012   Medtronic [id number] implanted by Dr Verlon Setting in 2008; generator change to STJ Assurity 12/2012 by Dr Rayann Heman    Current Medications: Current Meds  Medication Sig  . atorvastatin (LIPITOR) 40 MG tablet Take 1  tablet by mouth once daily  . Cholecalciferol (VITAMIN D) 1000 UNITS capsule Take 1,000 Units by mouth daily.   . clindamycin (CLEOCIN) 150 MG capsule Take 150 mg by mouth 3 (three) times daily.  . flunisolide (NASAREL) 29 MCG/ACT (0.025%) nasal spray Place 1 spray into the nose daily.  Marland Kitchen Lysine 500 MG TABS Take 500 mg by mouth daily.   . Multiple Vitamin (MULTIVITAMIN) tablet Take 1 tablet by mouth daily.   . pantoprazole (PROTONIX) 40 MG tablet Take 40 mg by mouth daily.      Allergies:   Aspirin and Erythromycin   Social History   Socioeconomic History  . Marital status: Married    Spouse name: Not on file  . Number of children: 3  . Years of education: Not on file  . Highest education level: Not on file  Occupational History  . Occupation: Retired  Tobacco Use  . Smoking status: Never Smoker  . Smokeless tobacco: Never Used  Substance and Sexual Activity  . Alcohol use: No  . Drug use: No  . Sexual activity: Not on file  Other Topics Concern  . Not on file  Social History Narrative  . Not on file   Social Determinants of Health   Financial Resource Strain:   . Difficulty of Paying Living Expenses:   Food Insecurity:   . Worried About Charity fundraiser  in the Last Year:   . [place] in the Last Year:   Transportation Needs:   . Film/video editor (Medical):   Marland Kitchen Lack of Transportation (Non-Medical):   Physical Activity:   . Days of Exercise per Week:   . Minutes of Exercise per Session:   Stress:   . Feeling of Stress :   Social Connections:   . Frequency of Communication with Friends and Family:   . Frequency of Social Gatherings with Friends and Family:   . Attends Religious Services:   . Active Member of Clubs or Organizations:   . Attends Archivist Meetings:   Marland Kitchen Marital Status:      Family History: The patient's family history includes Asthma in his mother; CVA in his mother; Colon cancer in his unknown relative; Heart attack in  his brother.    EKGs/Labs/Other Studies Reviewed:      EKG:  EKG is  ordered today.  The ekg ordered today demonstrates ventricular paced rhythm  Recent Labs: No results found for requested labs within last 8760 hours.  Recent Lipid Panel No results found for: CHOL, TRIG, HDL, CHOLHDL, VLDL, LDLCALC, LDLDIRECT  Physical Exam:    VS:  BP 112/70   Pulse (!) 56   Ht 5\' 10"  (1.778 m)   Wt 148 lb 12.8 oz (67.5 kg)   SpO2 96%   BMI 21.35 kg/m     Wt Readings from Last 3 Encounters:  09/18/19 148 lb 12.8 oz (67.5 kg)  03/07/19 147 lb (66.7 kg)  04/23/18 146 lb (66.2 kg)     GEN:  Well nourished, well developed in no acute distress HEENT: Normal NECK: No JVD; No carotid bruits LYMPHATICS: No lymphadenopathy CARDIAC: RRR, no murmurs, rubs, gallops RESPIRATORY:  Clear to auscultation without rales, wheezing or rhonchi  ABDOMEN: Soft, non-tender, non-distended MUSCULOSKELETAL:  No edema; No deformity  SKIN: Warm and dry NEUROLOGIC:  Alert and oriented x 3 PSYCHIATRIC:  Normal affect   ASSESSMENT:    1. Complete heart block ([place])   2. Essential hypertension   3. Cardiac pacemaker in situ   4. Dyslipidemia    PLAN:    In order of problems listed above:  Complete heart block -[place] pacemaker.  Doing well.  Followed by EP.  Excellent.  Longevity 8 years on review of pacemaker interrogation.  A sensed V paced 70% of the time, a paced V paced 30% of the time.  Pacemaker dependent.  Essential hypertension -Stable, doing well.  Sometimes does have a degree of whitecoat hypertension.  Feels great.  Hyperlipidemia -Previously had changing from simvastatin over to atorvastatin because of potential drug drug interactions.  LDL in the past has been excellent.  Hearing loss -[facility] hearing aids, hearing has gone downhill.  Medication Adjustments/Labs and Tests Ordered: Current medicines are reviewed at length with the patient today.  Concerns regarding medicines are  outlined above.  Orders Placed This Encounter  Procedures  . EKG 12-Lead   No orders of the defined types were placed in this encounter.   Patient Instructions  Medication Instructions:  The current medical regimen is effective;  continue present plan and medications.  *If you need a refill on your cardiac medications before your next appointment, please call your pharmacy*  Follow-Up: At [facility], you and your health needs are our priority.  As part of our continuing mission to provide you with exceptional heart care, we have created designated Provider Care Teams.  These  Care Teams include your primary Cardiologist (physician) and Advanced Practice Providers (APPs -  Physician Assistants and Nurse Practitioners) who all work together to provide you with the care you need, when you need it.  We recommend signing up for the patient portal called "MyChart".  Sign up information is provided on this After Visit Summary.  MyChart is used to connect with patients for Virtual Visits (Telemedicine).  Patients are able to view lab/test results, encounter notes, upcoming appointments, etc.  Non-urgent messages can be sent to your provider as well.   To learn more about what you can do with MyChart, go to [email or web address].    Your next appointment:   12 month(s)  The format for your next appointment:   In Person  Provider:   Candee Furbish, MD          Signed, Candee Furbish, MD  09/18/2019 10:48 AM    [place]

## 2019-09-18 NOTE — Patient Instructions (Signed)

## 2019-10-07 DIAGNOSIS — R69 Illness, unspecified: Secondary | ICD-10-CM | POA: Diagnosis not present

## 2019-10-23 ENCOUNTER — Ambulatory Visit (INDEPENDENT_AMBULATORY_CARE_PROVIDER_SITE_OTHER): Payer: Medicare HMO | Admitting: *Deleted

## 2019-10-23 DIAGNOSIS — I442 Atrioventricular block, complete: Secondary | ICD-10-CM | POA: Diagnosis not present

## 2019-10-23 LAB — CUP PACEART REMOTE DEVICE CHECK
Battery Remaining Longevity: 85 mo
Battery Remaining Percentage: 80 %
Battery Voltage: 2.93 V
Brady Statistic AP VP Percent: 34 %
Brady Statistic AP VS Percent: 1 %
Brady Statistic AS VP Percent: 66 %
Brady Statistic AS VS Percent: 1 %
Brady Statistic RA Percent Paced: 34 %
Brady Statistic RV Percent Paced: 99 %
Date Time Interrogation Session: 20210602020016
Implantable Lead Implant Date: 20000712
Implantable Lead Implant Date: 20000712
Implantable Lead Location: 753859
Implantable Lead Location: 753860
Implantable Lead Model: 4460
Implantable Lead Model: 4461
Implantable Lead Serial Number: 200213
Implantable Lead Serial Number: 20312
Implantable Pulse Generator Implant Date: 20140812
Lead Channel Impedance Value: 360 Ohm
Lead Channel Impedance Value: 380 Ohm
Lead Channel Pacing Threshold Amplitude: 0.5 V
Lead Channel Pacing Threshold Amplitude: 1 V
Lead Channel Pacing Threshold Pulse Width: 0.4 ms
Lead Channel Pacing Threshold Pulse Width: 0.4 ms
Lead Channel Sensing Intrinsic Amplitude: 12 mV
Lead Channel Sensing Intrinsic Amplitude: 3.1 mV
Lead Channel Setting Pacing Amplitude: 2 V
Lead Channel Setting Pacing Amplitude: 2.5 V
Lead Channel Setting Pacing Pulse Width: 0.4 ms
Lead Channel Setting Sensing Sensitivity: 4 mV
Pulse Gen Model: 2240
Pulse Gen Serial Number: 2974994

## 2019-10-25 NOTE — Progress Notes (Signed)
Remote pacemaker transmission.   

## 2019-10-28 ENCOUNTER — Telehealth: Payer: Self-pay | Admitting: Cardiology

## 2019-10-28 NOTE — Telephone Encounter (Signed)
Spoke with patient who reports he is having a hard time swallowing his atorvastatin tablet due to it's size.  He reports he has had his esophagus stretched 3 X and this pill keeps getting stuck.  Advised to contact pharmacy to see if they are able to obtain a different manufacturer that will be easier for him to swallow.  Also advised if they can not, to have the pharmacy call the office.  Pt was grateful for the information and will c/b if further concerns.

## 2019-10-28 NOTE — Telephone Encounter (Signed)
Pt c/o medication issue:  1. Name of Medication: atorvastatin (LIPITOR) 40 MG tablet  2. How are you currently taking this medication (dosage and times per day)? As directed  3. Are you having a reaction (difficulty breathing--STAT)? No  4. What is your medication issue? Patient states he is unable to swallow atorvastatin (LIPITOR) 40 MG tablet medication. Patient is requesting an alternative that is easier for him to take. Please call.

## 2019-11-26 DIAGNOSIS — R69 Illness, unspecified: Secondary | ICD-10-CM | POA: Diagnosis not present

## 2019-12-12 DIAGNOSIS — R69 Illness, unspecified: Secondary | ICD-10-CM | POA: Diagnosis not present

## 2019-12-17 DIAGNOSIS — Z886 Allergy status to analgesic agent status: Secondary | ICD-10-CM | POA: Diagnosis not present

## 2019-12-17 DIAGNOSIS — R32 Unspecified urinary incontinence: Secondary | ICD-10-CM | POA: Diagnosis not present

## 2019-12-17 DIAGNOSIS — Z823 Family history of stroke: Secondary | ICD-10-CM | POA: Diagnosis not present

## 2019-12-17 DIAGNOSIS — Z8249 Family history of ischemic heart disease and other diseases of the circulatory system: Secondary | ICD-10-CM | POA: Diagnosis not present

## 2019-12-17 DIAGNOSIS — E785 Hyperlipidemia, unspecified: Secondary | ICD-10-CM | POA: Diagnosis not present

## 2019-12-17 DIAGNOSIS — R030 Elevated blood-pressure reading, without diagnosis of hypertension: Secondary | ICD-10-CM | POA: Diagnosis not present

## 2019-12-17 DIAGNOSIS — Z008 Encounter for other general examination: Secondary | ICD-10-CM | POA: Diagnosis not present

## 2019-12-17 DIAGNOSIS — Z809 Family history of malignant neoplasm, unspecified: Secondary | ICD-10-CM | POA: Diagnosis not present

## 2019-12-17 DIAGNOSIS — J302 Other seasonal allergic rhinitis: Secondary | ICD-10-CM | POA: Diagnosis not present

## 2019-12-17 DIAGNOSIS — Z950 Presence of cardiac pacemaker: Secondary | ICD-10-CM | POA: Diagnosis not present

## 2019-12-17 DIAGNOSIS — K219 Gastro-esophageal reflux disease without esophagitis: Secondary | ICD-10-CM | POA: Diagnosis not present

## 2019-12-30 DIAGNOSIS — H43813 Vitreous degeneration, bilateral: Secondary | ICD-10-CM | POA: Diagnosis not present

## 2019-12-30 DIAGNOSIS — H524 Presbyopia: Secondary | ICD-10-CM | POA: Diagnosis not present

## 2019-12-30 DIAGNOSIS — H26492 Other secondary cataract, left eye: Secondary | ICD-10-CM | POA: Diagnosis not present

## 2019-12-30 DIAGNOSIS — H40013 Open angle with borderline findings, low risk, bilateral: Secondary | ICD-10-CM | POA: Diagnosis not present

## 2020-01-22 ENCOUNTER — Ambulatory Visit (INDEPENDENT_AMBULATORY_CARE_PROVIDER_SITE_OTHER): Payer: Medicare HMO | Admitting: *Deleted

## 2020-01-22 DIAGNOSIS — R001 Bradycardia, unspecified: Secondary | ICD-10-CM | POA: Diagnosis not present

## 2020-01-22 LAB — CUP PACEART REMOTE DEVICE CHECK
Battery Remaining Longevity: 77 mo
Battery Remaining Percentage: 72 %
Battery Voltage: 2.92 V
Brady Statistic AP VP Percent: 33 %
Brady Statistic AP VS Percent: 1 %
Brady Statistic AS VP Percent: 67 %
Brady Statistic AS VS Percent: 1 %
Brady Statistic RA Percent Paced: 33 %
Brady Statistic RV Percent Paced: 99 %
Date Time Interrogation Session: 20210901033006
Implantable Lead Implant Date: 20000712
Implantable Lead Implant Date: 20000712
Implantable Lead Location: 753859
Implantable Lead Location: 753860
Implantable Lead Model: 4460
Implantable Lead Model: 4461
Implantable Lead Serial Number: 200213
Implantable Lead Serial Number: 20312
Implantable Pulse Generator Implant Date: 20140812
Lead Channel Impedance Value: 350 Ohm
Lead Channel Impedance Value: 380 Ohm
Lead Channel Pacing Threshold Amplitude: 0.5 V
Lead Channel Pacing Threshold Amplitude: 1 V
Lead Channel Pacing Threshold Pulse Width: 0.4 ms
Lead Channel Pacing Threshold Pulse Width: 0.4 ms
Lead Channel Sensing Intrinsic Amplitude: 12 mV
Lead Channel Sensing Intrinsic Amplitude: 2.6 mV
Lead Channel Setting Pacing Amplitude: 2 V
Lead Channel Setting Pacing Amplitude: 2.5 V
Lead Channel Setting Pacing Pulse Width: 0.4 ms
Lead Channel Setting Sensing Sensitivity: 4 mV
Pulse Gen Model: 2240
Pulse Gen Serial Number: 2974994

## 2020-01-24 NOTE — Progress Notes (Signed)
Remote pacemaker transmission.   

## 2020-02-11 DIAGNOSIS — R69 Illness, unspecified: Secondary | ICD-10-CM | POA: Diagnosis not present

## 2020-03-05 NOTE — Progress Notes (Signed)
Electrophysiology Office Note Date: 03/06/2020  ID:  LISTER BRIZZI, DOB December 13, 1929, MRN [id number]  PCP: Leanna Battles, MD Primary Cardiologist: Marlou Porch Electrophysiologist: Allred  CC: Pacemaker follow-up  Charles Olson is a 86 y.o. male seen today for Dr Rayann Heman.  He presents today for routine electrophysiology followup.  Since last being seen in our clinic, the patient reports doing very well.  He denies chest pain, palpitations, dyspnea, PND, orthopnea, nausea, vomiting, dizziness, syncope, edema, weight gain, or early satiety.  Device History: STJ dual chamberPPM implanted2034for complete heart block; gen change 2005; gen change 2014  Past Medical History:  Diagnosis Date   AVB (atrioventricular block)    HIGH DEGREE; s/p PPM implantation   BPH (benign prostatic hypertrophy)    Chest pain    Esophageal stricture    GERD (gastroesophageal reflux disease)    History of orthostatic hypotension    Hyperlipidemia    Pacemaker    Sinusitis    Syncope and collapse    Past Surgical History:  Procedure Laterality Date   CARDIAC CATHETERIZATION  06/2003   NORMAL CORONARIES   CHOLECYSTECTOMY     PACEMAKER GENERATOR CHANGE N/A 01/01/2013   Procedure: PACEMAKER GENERATOR CHANGE;  Surgeon: Thompson Grayer, MD;  Location: [facility] CATH LAB;  Service: Cardiovascular;  Laterality: N/A;   PACEMAKER INSERTION  11/1998; 12/2012   Medtronic [id number] implanted by Dr Verlon Setting in 2008; generator change to STJ Assurity 12/2012 by Dr Rayann Heman    Current Outpatient Medications  Medication Sig Dispense Refill   atorvastatin (LIPITOR) 40 MG tablet Take 1 tablet by mouth once daily 90 tablet 3   Cholecalciferol (VITAMIN D) 1000 UNITS capsule Take 1,000 Units by mouth daily.      flunisolide (NASAREL) 29 MCG/ACT (0.025%) nasal spray Place 1 spray into the nose daily.     Lysine 500 MG TABS Take 500 mg by mouth daily.      Multiple Vitamin (MULTIVITAMIN) tablet Take 1 tablet by mouth  daily.      pantoprazole (PROTONIX) 40 MG tablet Take 40 mg by mouth daily.      No current facility-administered medications for this visit.    Allergies:   Aspirin and Erythromycin   Social History: Social History   Socioeconomic History   Marital status: Married    Spouse name: Not on file   Number of children: 3   Years of education: Not on file   Highest education level: Not on file  Occupational History   Occupation: Retired  Tobacco Use   Smoking status: Never Smoker   Smokeless tobacco: Never Used  Scientific laboratory technician Use: Never used  Substance and Sexual Activity   Alcohol use: No   Drug use: No   Sexual activity: Not on file  Other Topics Concern   Not on file  Social History Narrative   Not on file   Social Determinants of Health   Financial Resource Strain:    Difficulty of Paying Living Expenses: Not on file  Food Insecurity:    Worried About Charity fundraiser in the Last Year: Not on file   [place] in the Last Year: Not on file  Transportation Needs:    Lack of Transportation (Medical): Not on file   Lack of Transportation (Non-Medical): Not on file  Physical Activity:    Days of Exercise per Week: Not on file   Minutes of Exercise per Session: Not on file  Stress:    Feeling  of Stress : Not on file  Social Connections:    Frequency of Communication with Friends and Family: Not on file   Frequency of Social Gatherings with Friends and Family: Not on file   Attends Religious Services: Not on file   Active Member of Clubs or Organizations: Not on file   Attends Archivist Meetings: Not on file   Marital Status: Not on file  Intimate Partner Violence:    Fear of Current or Ex-Partner: Not on file   Emotionally Abused: Not on file   Physically Abused: Not on file   Sexually Abused: Not on file    Family History: Family History  Problem Relation Age of Onset   Heart attack Brother    CVA  Mother    Asthma Mother    Colon cancer Unknown        ?     Review of Systems: All other systems reviewed and are otherwise negative except as noted above.   Physical Exam: VS:  BP 136/70    Pulse 72    Ht 5\' 10"  (1.778 m)    Wt 149 lb (67.6 kg)    SpO2 96%    BMI 21.38 kg/m  , BMI Body mass index is 21.38 kg/m.  GEN- The patient is well appearing, alert and oriented x 3 today.   HEENT: normocephalic, atraumatic; sclera clear, conjunctiva pink; hearing intact; oropharynx clear; neck supple  Lungs- Clear to ausculation bilaterally, normal work of breathing.  No wheezes, rales, rhonchi Heart- Regular rate and rhythm (paced) GI- soft, non-tender, non-distended, bowel sounds present  Extremities- no clubbing, cyanosis, or edema  MS- no significant deformity or atrophy Skin- warm and dry, no rash or lesion; PPM pocket well healed Psych- euthymic mood, full affect Neuro- strength and sensation are intact  PPM Interrogation- reviewed in detail today,  See PACEART report  EKG:  EKG is not ordered today.  Recent Labs: No results found for requested labs within last 8760 hours.   Wt Readings from Last 3 Encounters:  03/06/20 149 lb (67.6 kg)  09/18/19 148 lb 12.8 oz (67.5 kg)  03/07/19 147 lb (66.7 kg)     Other studies Reviewed: Additional studies/ records that were reviewed today include: Dr Marlou Porch' notes   Assessment and Plan:  1.  Complete heart block Normal PPM function See Pace Art report No changes today Pt is pacemaker dependent  2.  HTN Stable No change required today   Current medicines are reviewed at length with the patient today.   The patient does not have concerns regarding his medicines.  The following changes were made today:  none  Labs/ tests ordered today include: none No orders of the defined types were placed in this encounter.    Disposition:   Follow up with remotes, me in 1 year     Signed, Chanetta Marshall, NP 03/06/2020 9:12  AM  [facility] [address] [place] [address] [phone] (office) [phone] (fax)

## 2020-03-06 ENCOUNTER — Other Ambulatory Visit: Payer: Self-pay

## 2020-03-06 ENCOUNTER — Encounter: Payer: Self-pay | Admitting: Nurse Practitioner

## 2020-03-06 ENCOUNTER — Ambulatory Visit: Payer: Medicare HMO | Admitting: Nurse Practitioner

## 2020-03-06 VITALS — BP 136/70 | HR 72 | Ht 70.0 in | Wt 149.0 lb

## 2020-03-06 DIAGNOSIS — I442 Atrioventricular block, complete: Secondary | ICD-10-CM

## 2020-03-06 DIAGNOSIS — I10 Essential (primary) hypertension: Secondary | ICD-10-CM

## 2020-03-06 NOTE — Patient Instructions (Signed)
Medication Instructions:  *If you need a refill on your cardiac medications before your next appointment, please call your pharmacy*  Follow-Up: At [facility], you and your health needs are our priority.  As part of our continuing mission to provide you with exceptional heart care, we have created designated Provider Care Teams.  These Care Teams include your primary Cardiologist (physician) and Advanced Practice Providers (APPs -  Physician Assistants and Nurse Practitioners) who all work together to provide you with the care you need, when you need it.  We recommend signing up for the patient portal called "MyChart".  Sign up information is provided on this After Visit Summary.  MyChart is used to connect with patients for Virtual Visits (Telemedicine).  Patients are able to view lab/test results, encounter notes, upcoming appointments, etc.  Non-urgent messages can be sent to your provider as well.   To learn more about what you can do with MyChart, go to [email or web address].    Your next appointment:   Your physician wants you to follow-up in: 1 YEAR with Chanetta Marshall, NP. You will receive a reminder letter in the mail two months in advance. If you don't receive a letter, please call our office to schedule the follow-up appointment.  Remote monitoring is used to monitor your Pacemaker from home. This monitoring reduces the number of office visits required to check your device to one time per year. It allows [place] to keep an eye on the functioning of your device to ensure it is working properly. You are scheduled for a device check from home on 04/22/20. You may send your transmission at any time that day. If you have a wireless device, the transmission will be sent automatically. After your physician reviews your transmission, you will receive a postcard with your next transmission date.  The format for your next appointment:   In Person with Chanetta Marshall, NP

## 2020-03-13 DIAGNOSIS — X32XXXD Exposure to sunlight, subsequent encounter: Secondary | ICD-10-CM | POA: Diagnosis not present

## 2020-03-13 DIAGNOSIS — L820 Inflamed seborrheic keratosis: Secondary | ICD-10-CM | POA: Diagnosis not present

## 2020-03-13 DIAGNOSIS — L570 Actinic keratosis: Secondary | ICD-10-CM | POA: Diagnosis not present

## 2020-04-10 DIAGNOSIS — I10 Essential (primary) hypertension: Secondary | ICD-10-CM | POA: Diagnosis not present

## 2020-04-10 DIAGNOSIS — E785 Hyperlipidemia, unspecified: Secondary | ICD-10-CM | POA: Diagnosis not present

## 2020-04-10 DIAGNOSIS — D329 Benign neoplasm of meninges, unspecified: Secondary | ICD-10-CM | POA: Diagnosis not present

## 2020-04-10 DIAGNOSIS — I872 Venous insufficiency (chronic) (peripheral): Secondary | ICD-10-CM | POA: Diagnosis not present

## 2020-04-10 DIAGNOSIS — Z0000 Encounter for general adult medical examination without abnormal findings: Secondary | ICD-10-CM | POA: Diagnosis not present

## 2020-04-10 DIAGNOSIS — K219 Gastro-esophageal reflux disease without esophagitis: Secondary | ICD-10-CM | POA: Diagnosis not present

## 2020-04-10 DIAGNOSIS — R1314 Dysphagia, pharyngoesophageal phase: Secondary | ICD-10-CM | POA: Diagnosis not present

## 2020-04-10 DIAGNOSIS — Z950 Presence of cardiac pacemaker: Secondary | ICD-10-CM | POA: Diagnosis not present

## 2020-04-22 ENCOUNTER — Ambulatory Visit (INDEPENDENT_AMBULATORY_CARE_PROVIDER_SITE_OTHER): Payer: Medicare HMO

## 2020-04-22 DIAGNOSIS — R001 Bradycardia, unspecified: Secondary | ICD-10-CM | POA: Diagnosis not present

## 2020-04-22 LAB — CUP PACEART REMOTE DEVICE CHECK
Battery Remaining Longevity: 86 mo
Battery Remaining Percentage: 72 %
Battery Voltage: 2.92 V
Brady Statistic AP VP Percent: 38 %
Brady Statistic AP VS Percent: 1 %
Brady Statistic AS VP Percent: 62 %
Brady Statistic AS VS Percent: 1 %
Brady Statistic RA Percent Paced: 38 %
Brady Statistic RV Percent Paced: 99 %
Date Time Interrogation Session: 20211201020013
Implantable Lead Implant Date: 20000712
Implantable Lead Implant Date: 20000712
Implantable Lead Location: 753859
Implantable Lead Location: 753860
Implantable Lead Model: 4460
Implantable Lead Model: 4461
Implantable Lead Serial Number: 200213
Implantable Lead Serial Number: 20312
Implantable Pulse Generator Implant Date: 20140812
Lead Channel Impedance Value: 360 Ohm
Lead Channel Impedance Value: 380 Ohm
Lead Channel Pacing Threshold Amplitude: 0.5 V
Lead Channel Pacing Threshold Amplitude: 0.875 V
Lead Channel Pacing Threshold Pulse Width: 0.4 ms
Lead Channel Pacing Threshold Pulse Width: 0.4 ms
Lead Channel Sensing Intrinsic Amplitude: 12 mV
Lead Channel Sensing Intrinsic Amplitude: 3 mV
Lead Channel Setting Pacing Amplitude: 1.125
Lead Channel Setting Pacing Amplitude: 2 V
Lead Channel Setting Pacing Pulse Width: 0.4 ms
Lead Channel Setting Sensing Sensitivity: 4 mV
Pulse Gen Model: 2240
Pulse Gen Serial Number: 2974994

## 2020-04-29 NOTE — Progress Notes (Signed)
Remote pacemaker transmission.   

## 2020-05-11 ENCOUNTER — Other Ambulatory Visit: Payer: Self-pay | Admitting: Cardiology

## 2020-05-26 ENCOUNTER — Other Ambulatory Visit: Payer: Self-pay | Admitting: Internal Medicine

## 2020-05-26 DIAGNOSIS — R1314 Dysphagia, pharyngoesophageal phase: Secondary | ICD-10-CM

## 2020-05-29 ENCOUNTER — Ambulatory Visit
Admission: RE | Admit: 2020-05-29 | Discharge: 2020-05-29 | Disposition: A | Discharge status: Home / Self Care | Payer: Medicare HMO | Source: Ambulatory Visit | Attending: Internal Medicine | Admitting: Internal Medicine

## 2020-05-29 DIAGNOSIS — R1314 Dysphagia, pharyngoesophageal phase: Secondary | ICD-10-CM

## 2020-05-29 DIAGNOSIS — K449 Diaphragmatic hernia without obstruction or gangrene: Secondary | ICD-10-CM | POA: Diagnosis not present

## 2020-05-29 DIAGNOSIS — K224 Dyskinesia of esophagus: Secondary | ICD-10-CM | POA: Diagnosis not present

## 2020-07-01 DIAGNOSIS — H40013 Open angle with borderline findings, low risk, bilateral: Secondary | ICD-10-CM | POA: Diagnosis not present

## 2020-07-22 ENCOUNTER — Ambulatory Visit (INDEPENDENT_AMBULATORY_CARE_PROVIDER_SITE_OTHER): Payer: Medicare HMO

## 2020-07-22 DIAGNOSIS — R001 Bradycardia, unspecified: Secondary | ICD-10-CM

## 2020-07-24 LAB — CUP PACEART REMOTE DEVICE CHECK
Battery Remaining Longevity: 77 mo
Battery Remaining Percentage: 64 %
Battery Voltage: 2.9 V
Brady Statistic AP VP Percent: 37 %
Brady Statistic AP VS Percent: 1 %
Brady Statistic AS VP Percent: 63 %
Brady Statistic AS VS Percent: 1 %
Brady Statistic RA Percent Paced: 37 %
Brady Statistic RV Percent Paced: 99 %
Date Time Interrogation Session: 20220302055048
Implantable Lead Implant Date: 20000712
Implantable Lead Implant Date: 20000712
Implantable Lead Location: 753859
Implantable Lead Location: 753860
Implantable Lead Model: 4460
Implantable Lead Model: 4461
Implantable Lead Serial Number: 200213
Implantable Lead Serial Number: 20312
Implantable Pulse Generator Implant Date: 20140812
Lead Channel Impedance Value: 360 Ohm
Lead Channel Impedance Value: 380 Ohm
Lead Channel Pacing Threshold Amplitude: 0.5 V
Lead Channel Pacing Threshold Amplitude: 0.875 V
Lead Channel Pacing Threshold Pulse Width: 0.4 ms
Lead Channel Pacing Threshold Pulse Width: 0.4 ms
Lead Channel Sensing Intrinsic Amplitude: 12 mV
Lead Channel Sensing Intrinsic Amplitude: 3 mV
Lead Channel Setting Pacing Amplitude: 1.125
Lead Channel Setting Pacing Amplitude: 2 V
Lead Channel Setting Pacing Pulse Width: 0.4 ms
Lead Channel Setting Sensing Sensitivity: 4 mV
Pulse Gen Model: 2240
Pulse Gen Serial Number: 2974994

## 2020-07-29 DIAGNOSIS — N3942 Incontinence without sensory awareness: Secondary | ICD-10-CM | POA: Diagnosis not present

## 2020-07-29 DIAGNOSIS — N200 Calculus of kidney: Secondary | ICD-10-CM | POA: Diagnosis not present

## 2020-07-31 NOTE — Progress Notes (Signed)
Remote pacemaker transmission.   

## 2020-09-23 DIAGNOSIS — R1310 Dysphagia, unspecified: Secondary | ICD-10-CM | POA: Diagnosis not present

## 2020-10-07 ENCOUNTER — Ambulatory Visit: Payer: Medicare HMO | Admitting: Cardiology

## 2020-10-21 ENCOUNTER — Ambulatory Visit (INDEPENDENT_AMBULATORY_CARE_PROVIDER_SITE_OTHER): Payer: Medicare HMO

## 2020-10-21 DIAGNOSIS — I442 Atrioventricular block, complete: Secondary | ICD-10-CM | POA: Diagnosis not present

## 2020-10-22 LAB — CUP PACEART REMOTE DEVICE CHECK
Battery Remaining Longevity: 70 mo
Battery Remaining Percentage: 56 %
Battery Voltage: 2.89 V
Brady Statistic AP VP Percent: 36 %
Brady Statistic AP VS Percent: 1 %
Brady Statistic AS VP Percent: 64 %
Brady Statistic AS VS Percent: 1 %
Brady Statistic RA Percent Paced: 36 %
Brady Statistic RV Percent Paced: 99 %
Date Time Interrogation Session: 20220601020014
Implantable Lead Implant Date: 20000712
Implantable Lead Implant Date: 20000712
Implantable Lead Location: 753859
Implantable Lead Location: 753860
Implantable Lead Model: 4460
Implantable Lead Model: 4461
Implantable Lead Serial Number: 200213
Implantable Lead Serial Number: 20312
Implantable Pulse Generator Implant Date: 20140812
Lead Channel Impedance Value: 400 Ohm
Lead Channel Impedance Value: 410 Ohm
Lead Channel Pacing Threshold Amplitude: 0.5 V
Lead Channel Pacing Threshold Amplitude: 1 V
Lead Channel Pacing Threshold Pulse Width: 0.4 ms
Lead Channel Pacing Threshold Pulse Width: 0.4 ms
Lead Channel Sensing Intrinsic Amplitude: 11.1 mV
Lead Channel Sensing Intrinsic Amplitude: 4.7 mV
Lead Channel Setting Pacing Amplitude: 1.25 V
Lead Channel Setting Pacing Amplitude: 2 V
Lead Channel Setting Pacing Pulse Width: 0.4 ms
Lead Channel Setting Sensing Sensitivity: 4 mV
Pulse Gen Model: 2240
Pulse Gen Serial Number: 2974994

## 2020-11-02 ENCOUNTER — Other Ambulatory Visit: Payer: Self-pay | Admitting: Cardiology

## 2020-11-13 NOTE — Progress Notes (Signed)
Remote pacemaker transmission.   

## 2021-01-01 ENCOUNTER — Ambulatory Visit: Payer: Medicare HMO | Admitting: Cardiology

## 2021-01-01 ENCOUNTER — Other Ambulatory Visit: Payer: Self-pay

## 2021-01-01 ENCOUNTER — Encounter: Payer: Self-pay | Admitting: Cardiology

## 2021-01-01 VITALS — BP 130/70 | HR 67 | Ht 70.0 in | Wt 144.0 lb

## 2021-01-01 DIAGNOSIS — E785 Hyperlipidemia, unspecified: Secondary | ICD-10-CM

## 2021-01-01 DIAGNOSIS — I10 Essential (primary) hypertension: Secondary | ICD-10-CM | POA: Diagnosis not present

## 2021-01-01 DIAGNOSIS — Z950 Presence of cardiac pacemaker: Secondary | ICD-10-CM

## 2021-01-01 DIAGNOSIS — I442 Atrioventricular block, complete: Secondary | ICD-10-CM | POA: Diagnosis not present

## 2021-01-01 NOTE — Assessment & Plan Note (Addendum)
Continue with [facility] permanent pacemaker.  He is ventricular paced on his ECG.  Sinus rhythm otherwise.  Doing very well.  This is his third pacemaker he states.  Enjoying pickleball at [employer].

## 2021-01-01 NOTE — Patient Instructions (Signed)
Medication Instructions:  The current medical regimen is effective;  continue present plan and medications.  *If you need a refill on your cardiac medications before your next appointment, please call your pharmacy*  Follow-Up: At [facility], you and your health needs are our priority.  As part of our continuing mission to provide you with exceptional heart care, we have created designated Provider Care Teams.  These Care Teams include your primary Cardiologist (physician) and Advanced Practice Providers (APPs -  Physician Assistants and Nurse Practitioners) who all work together to provide you with the care you need, when you need it.  We recommend signing up for the patient portal called "MyChart".  Sign up information is provided on this After Visit Summary.  MyChart is used to connect with patients for Virtual Visits (Telemedicine).  Patients are able to view lab/test results, encounter notes, upcoming appointments, etc.  Non-urgent messages can be sent to your provider as well.   To learn more about what you can do with MyChart, go to [email or web address].    Your next appointment:   1 year(s)  The format for your next appointment:   In Person  Provider:   Mark Skains, MD   Thank you for choosing [facility]!!    

## 2021-01-01 NOTE — Progress Notes (Signed)
Cardiology Office Note:    Date:  01/01/2021   ID:  Charles Olson, DOB November 02, 1929, MRN [id number]  PCP:  Leanna Battles, MD  Cardiologist:  Candee Furbish, MD  Electrophysiologist:  None   Referring MD: Leanna Battles, MD    History of Present Illness:    Charles Olson is a 87 y.o. male here for the follow-up of complete heart block and hypertension.  Saint Jude dual-chamber pacemaker implanted in 2000 with generator change in 2014.    Former patient of Dr. Sherryl Barters.  He did recall previously that when he had his pacemaker changed he felt funny as the wires are being exchanged and his heart rate paused.  He and his wife live at friend's home. His wife goes by [employer]. 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  Worked into his mid 77s.  Today: Overall, he is doing well and has no new complaints.   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 Also, he is now playing pickleball for about 30 minutes at a time. 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   He denies any palpitations, chest pain, or shortness of breath. No lightheadedness, headaches, syncope, orthopnea, or PND. Also has no lower extremity edema or exertional symptoms.  Past Medical History:  Diagnosis Date   AVB (atrioventricular block)    HIGH DEGREE; s/p PPM implantation   BPH (benign prostatic hypertrophy)    Chest pain    Esophageal stricture    GERD (gastroesophageal reflux disease)    History of orthostatic hypotension    Hyperlipidemia    Pacemaker    Sinusitis    Syncope and collapse     Past Surgical History:  Procedure Laterality Date   CARDIAC CATHETERIZATION  06/2003   NORMAL CORONARIES   CHOLECYSTECTOMY     PACEMAKER GENERATOR CHANGE N/A 01/01/2013   Procedure: PACEMAKER GENERATOR CHANGE;  Surgeon: Thompson Grayer, MD;  Location: [facility] CATH LAB;  Service: Cardiovascular;  Laterality: N/A;   PACEMAKER INSERTION  11/1998; 12/2012   Medtronic [id number] implanted by Dr  Verlon Setting in 2008; generator change to STJ Assurity 12/2012 by Dr Rayann Heman    Current Medications: Current Meds  Medication Sig   atorvastatin (LIPITOR) 40 MG tablet Take 1 tablet (40 mg total) by mouth daily. Please keep upcoming appt in August 2022 with Dr. Marlou Porch before anymore refills. Thank you   Cholecalciferol (VITAMIN D) 1000 UNITS capsule Take 1,000 Units by mouth daily.    flunisolide (NASAREL) 29 MCG/ACT (0.025%) nasal spray Place 1 spray into the nose daily.   Lysine 500 MG TABS Take 500 mg by mouth daily.    Multiple Vitamin (MULTIVITAMIN) tablet Take 1 tablet by mouth daily. It's a chewable tablet   pantoprazole (PROTONIX) 40 MG tablet Take 40 mg by mouth daily.      Allergies:   Aspirin and Erythromycin   Social History   Socioeconomic History   Marital status: Married    Spouse name: Not on file   Number of children: 3   Years of education: Not on file   Highest education level: Not on file  Occupational History   Occupation: Retired  Tobacco Use   Smoking status: Never   Smokeless tobacco: Never  Vaping Use   Vaping Use: Never used  Substance and Sexual Activity   Alcohol use: No   Drug use: No   Sexual activity: Not on file  Other Topics Concern   Not on  file  Social History Narrative   Not on file   Social Determinants of Health   Financial Resource Strain: Not on file  Food Insecurity: Not on file  Transportation Needs: Not on file  Physical Activity: Not on file  Stress: Not on file  Social Connections: Not on file     Family History: The patient's family history includes Asthma in his mother; CVA in his mother; Colon cancer in his unknown relative; Heart attack in his brother.  ROS: Please see the history of present illness. All other systems are reviewed and negative.    EKGs/Labs/Other Studies Reviewed:   No recent CV studies available.  EKG:    01/01/2021: NSR with ventricular pacing. 67 bpm 09/18/2019: ventricular paced rhythm  Recent  Labs: No results found for requested labs within last 8760 hours.  Recent Lipid Panel No results found for: CHOL, TRIG, HDL, CHOLHDL, VLDL, LDLCALC, LDLDIRECT  Physical Exam:    VS:  BP 130/70 (BP Location: Left Arm, Patient Position: Sitting, Cuff Size: Normal)   Pulse 67   Ht '5\' 10"'$  (1.778 m)   Wt 144 lb (65.3 kg)   SpO2 97%   BMI 20.66 kg/m     Wt Readings from Last 3 Encounters:  01/01/21 144 lb (65.3 kg)  03/06/20 149 lb (67.6 kg)  09/18/19 148 lb 12.8 oz (67.5 kg)    GEN: Well nourished, well developed in no acute distress HEENT: Normal NECK: No JVD; No carotid bruits LYMPHATICS: No lymphadenopathy CARDIAC: RRR, no murmurs, rubs, gallops RESPIRATORY:  Clear to auscultation without rales, wheezing or rhonchi  ABDOMEN: Soft, non-tender, non-distended MUSCULOSKELETAL:  No edema; No deformity  SKIN: Warm and dry NEUROLOGIC:  Alert and oriented x 3 PSYCHIATRIC:  Normal affect   ASSESSMENT:    1. Essential hypertension   2. Cardiac pacemaker in situ   3. Complete heart block ([place])   4. Essential hypertension, benign   5. Dyslipidemia     PLAN:   In order of problems listed above: Complete heart block ([place]) Continue with [place] permanent pacemaker.  He is ventricular paced on his ECG.  Sinus rhythm otherwise.  Doing very well.  This is his third pacemaker he states.  Enjoying pickleball at [facility].  Essential hypertension, benign Blood pressure currently well controlled.  Occasionally may have a degree of whitecoat hypertension.  Doing well.  Dyslipidemia Doing well.  On atorvastatin doing well atorvastatin 40 mg a day.  No changes made.  No myalgias.  Continue with current medical management.   Follow-up: 1 year.  Medication Adjustments/Labs and Tests Ordered: Current medicines are reviewed at length with the patient today.  Concerns regarding medicines are outlined above.   Orders Placed This Encounter  Procedures   EKG 12-Lead    No  orders of the defined types were placed in this encounter.  Patient Instructions  Medication Instructions:  The current medical regimen is effective;  continue present plan and medications.  *If you need a refill on your cardiac medications before your next appointment, please call your pharmacy*  Follow-Up: At [facility], you and your health needs are our priority.  As part of our continuing mission to provide you with exceptional heart care, we have created designated Provider Care Teams.  These Care Teams include your primary Cardiologist (physician) and Advanced Practice Providers (APPs -  Physician Assistants and Nurse Practitioners) who all work together to provide you with the care you need, when you need it.  We  recommend signing up for the patient portal called "MyChart".  Sign up information is provided on this After Visit Summary.  MyChart is used to connect with patients for Virtual Visits (Telemedicine).  Patients are able to view lab/test results, encounter notes, upcoming appointments, etc.  Non-urgent messages can be sent to your provider as well.   To learn more about what you can do with MyChart, go to [email or web address].    Your next appointment:   1 year(s)  The format for your next appointment:   In Person  Provider:   Candee Furbish, MD  Thank you for choosing [facility]!!     I,Mathew Stumpf,acting as a scribe for Candee Furbish, MD.,have documented all relevant documentation on the behalf of Candee Furbish, MD,as directed by  Candee Furbish, MD while in the presence of Candee Furbish, MD.  I, Candee Furbish, MD, have reviewed all documentation for this visit. The documentation on 01/01/21 for the exam, diagnosis, procedures, and orders are all accurate and complete.   Signed, Candee Furbish, MD  01/01/2021 9:54 AM    [facility]

## 2021-01-01 NOTE — Assessment & Plan Note (Signed)
Doing well.  On atorvastatin doing well atorvastatin 40 mg a day.  No changes made.  No myalgias.  Continue with current medical management.

## 2021-01-01 NOTE — Assessment & Plan Note (Signed)
Blood pressure currently well controlled.  Occasionally may have a degree of whitecoat hypertension.  Doing well.

## 2021-01-20 ENCOUNTER — Ambulatory Visit (INDEPENDENT_AMBULATORY_CARE_PROVIDER_SITE_OTHER): Payer: Medicare HMO

## 2021-01-20 DIAGNOSIS — I442 Atrioventricular block, complete: Secondary | ICD-10-CM

## 2021-01-20 LAB — CUP PACEART REMOTE DEVICE CHECK
Battery Remaining Longevity: 20 mo
Battery Remaining Percentage: 16 %
Battery Voltage: 2.87 V
Brady Statistic AP VP Percent: 37 %
Brady Statistic AP VS Percent: 1 %
Brady Statistic AS VP Percent: 63 %
Brady Statistic AS VS Percent: 1 %
Brady Statistic RA Percent Paced: 37 %
Brady Statistic RV Percent Paced: 99 %
Date Time Interrogation Session: 20220831021627
Implantable Lead Implant Date: 20000712
Implantable Lead Implant Date: 20000712
Implantable Lead Location: 753859
Implantable Lead Location: 753860
Implantable Lead Model: 4460
Implantable Lead Model: 4461
Implantable Lead Serial Number: 200213
Implantable Lead Serial Number: 20312
Implantable Pulse Generator Implant Date: 20140812
Lead Channel Impedance Value: 380 Ohm
Lead Channel Impedance Value: 390 Ohm
Lead Channel Pacing Threshold Amplitude: 0.5 V
Lead Channel Pacing Threshold Amplitude: 1 V
Lead Channel Pacing Threshold Pulse Width: 0.4 ms
Lead Channel Pacing Threshold Pulse Width: 0.4 ms
Lead Channel Sensing Intrinsic Amplitude: 12 mV
Lead Channel Sensing Intrinsic Amplitude: 3.1 mV
Lead Channel Setting Pacing Amplitude: 1.25 V
Lead Channel Setting Pacing Amplitude: 2 V
Lead Channel Setting Pacing Pulse Width: 0.4 ms
Lead Channel Setting Sensing Sensitivity: 4 mV
Pulse Gen Model: 2240
Pulse Gen Serial Number: 2974994

## 2021-01-25 ENCOUNTER — Other Ambulatory Visit: Payer: Self-pay | Admitting: Cardiology

## 2021-01-28 IMAGING — RF DG ESOPHAGUS
12 of 14 series · 14 of 24 positions shown · non-contrast
Comparison: December 28, 2009

CLINICAL DATA: Dysphagia, difficulty with pills.

EXAM:
ESOPHOGRAM / BARIUM SWALLOW / BARIUM TABLET STUDY
TECHNIQUE: Combined double contrast and single contrast examination performed
using effervescent crystals, thick barium liquid, and thin barium
liquid. The patient was observed with fluoroscopy swallowing a 13 mm
barium sulphate tablet.
FLUOROSCOPY TIME:  Fluoroscopy Time:  3 minutes 42 seconds
Radiation Exposure Index (if provided by the fluoroscopic device):
55 mGy
Number of Acquired Spot Images: 3

[Series 1: sequence · 1 of 23 frames shown (1 of 11)]
[frame 4/23]
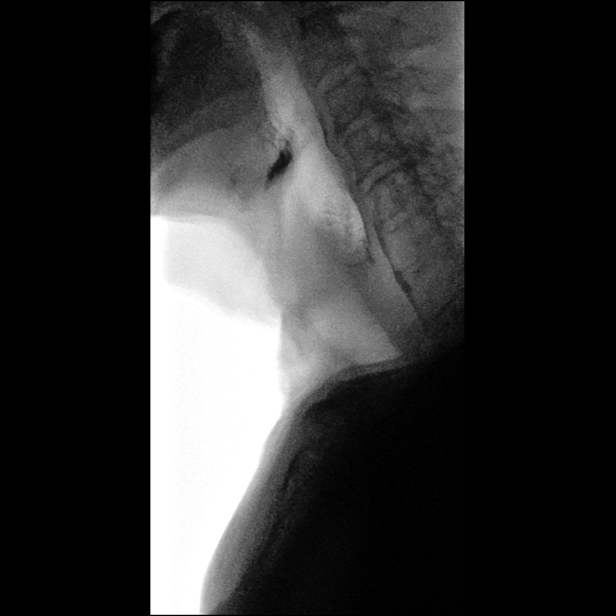

[Series 2: sequence · 1 of 25 frames shown (2 of 11)]
[frame 13/25]
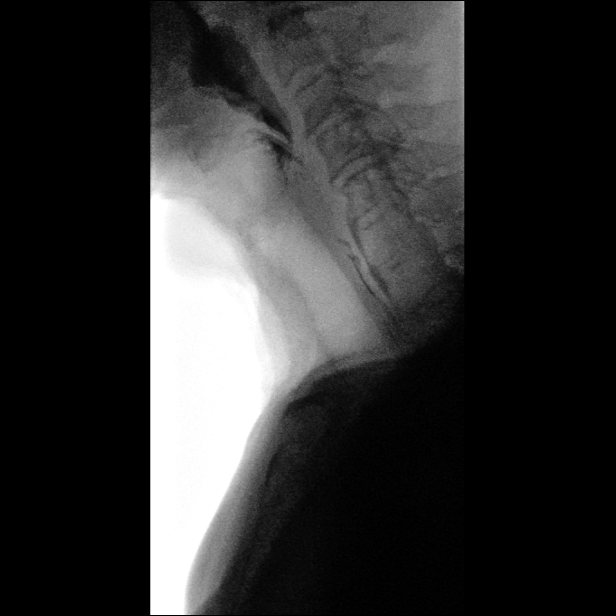

[Series 3: sequence · 1 of 19 frames shown (3 of 11)]
[frame 10/19]
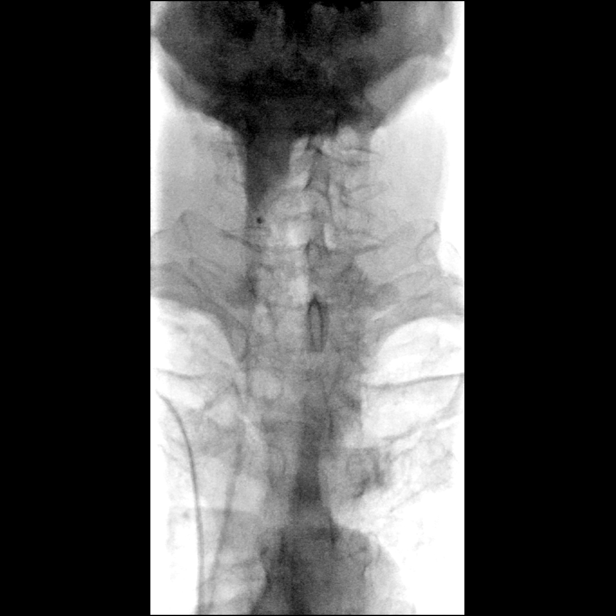

[Series 4: sequence · 1 of 65 frames shown (4 of 11)]
[frame 56/65]
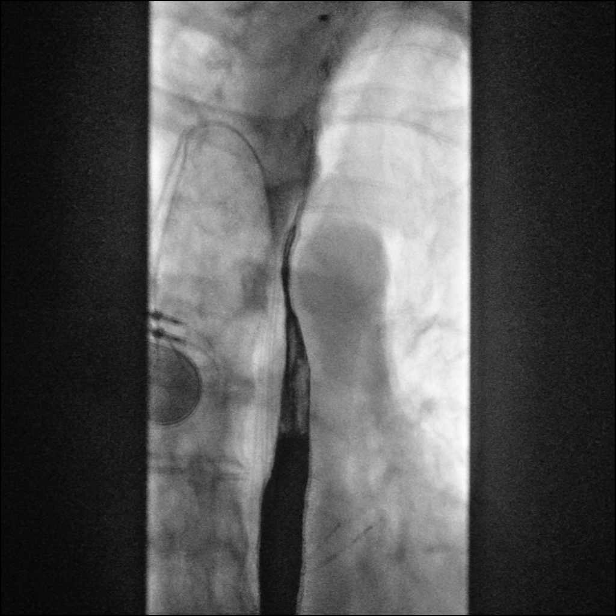

[Series 5: sequence · 1 of 36 frames shown (5 of 11)]
[frame 11/36]
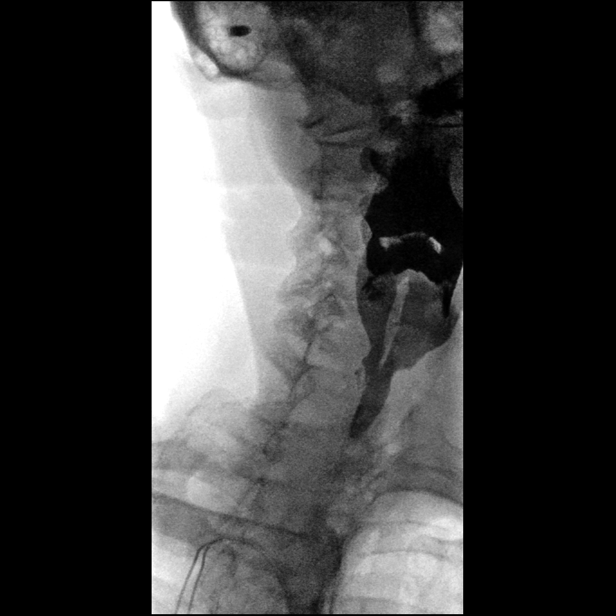

[Series 7: sequence · 1 of 36 frames shown (6 of 11)]
[frame 6/36]
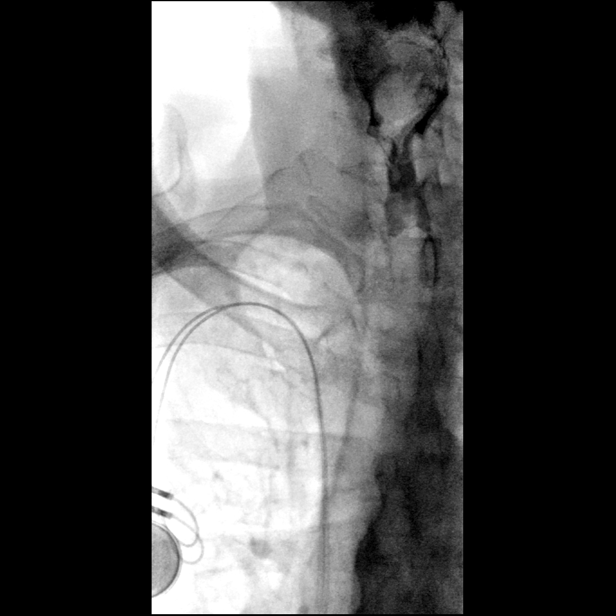

[Series 8: sequence · 2 of 13 frames shown (7 of 11)]
[frame 2/13]
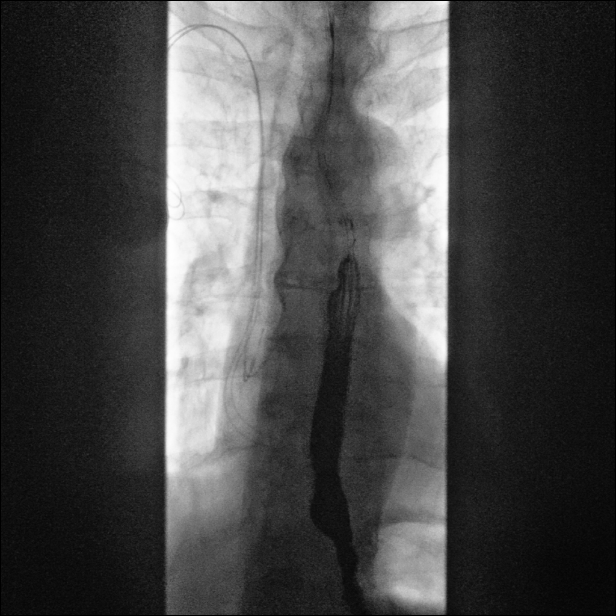
[frame 12/13]
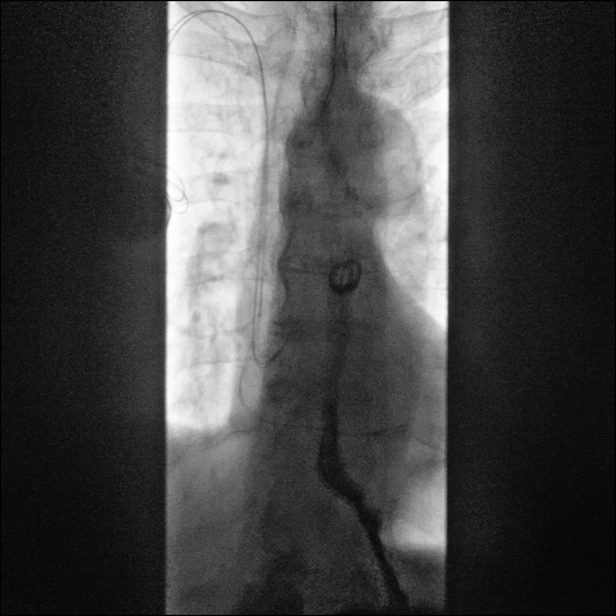

[Series 10: sequence · 1 of 29 frames shown (8 of 11)]
[frame 24/29]
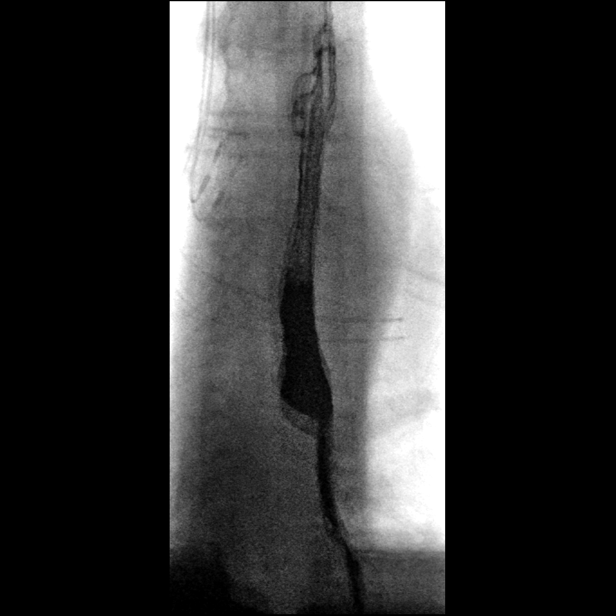

[Series 11: sequence · 1 of 31 frames shown (9 of 11)]
[frame 27/31]
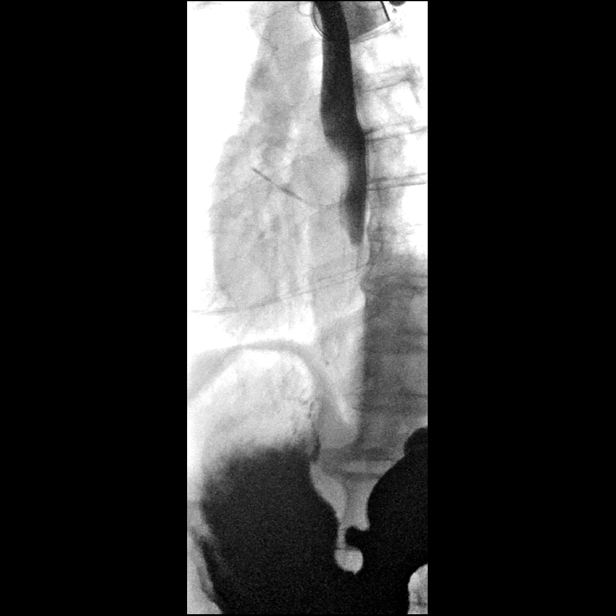

[Series 13: sequence · 2 of 34 frames shown (10 of 11)]
[frame 6/34]
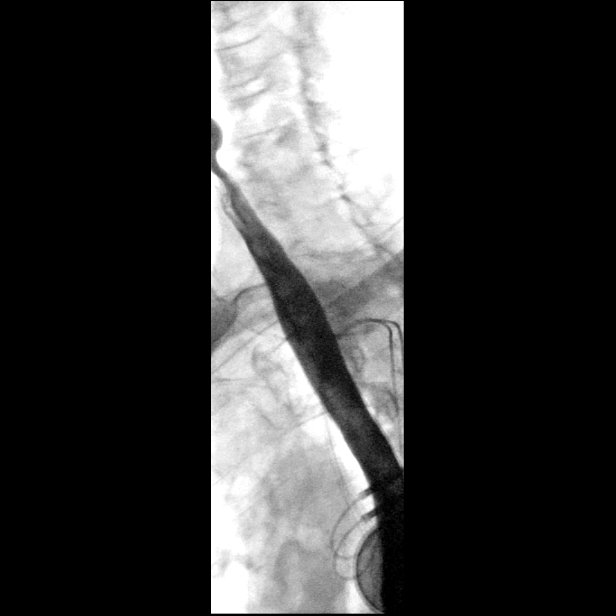
[frame 21/34]
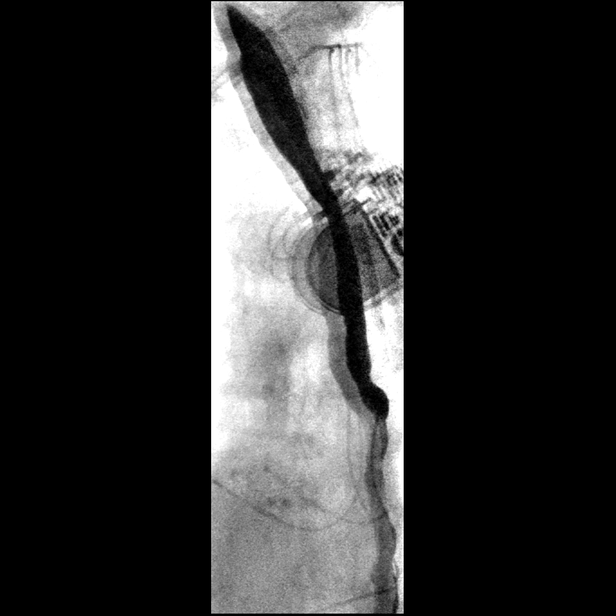

[Series 14: sequence · 1 of 8 frames shown (11 of 11)]
[frame 7/8]
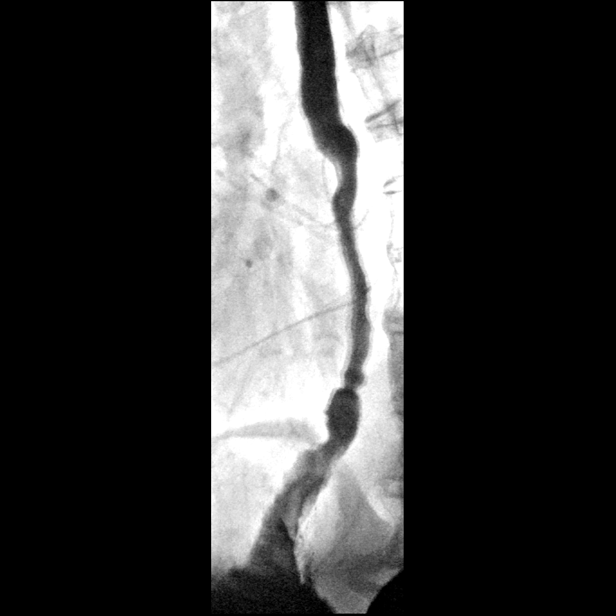

[Series 16: one shot · 1 of 1 slices shown]
[im 1/1]
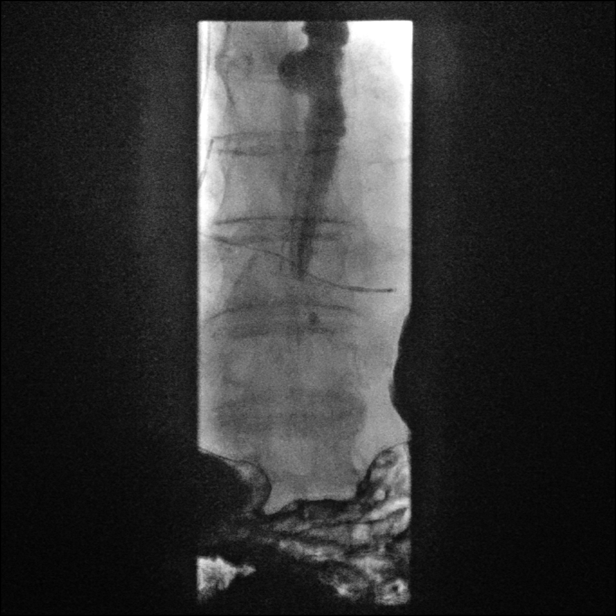

[14 of 24 positions shown; findings below may reference images not displayed]

FINDINGS: Thin barium was administered showing no gross swallow dysfunction.
Degenerative changes present in the spine with fusion of multiple
levels due to degenerative process.

Effervescent crystals and thick barium, high-density barium then
administered displaying signs of esophageal dysmotility and spasm
similar to the previous exam. Signs of potential cricopharyngeal
bar, mild better seen on more distended views of the esophagus in
the RAO position.

Single swallow assessment with intact primary wave. Full column
assessment with marked tertiary peristaltic activity. Small hiatal
hernia with suspected small Schatzki's ring. Narrowing difficult to
determine in terms of caliber contrast flows readily through this
area. Barium tablet attempt without success. Patient states he
currently chews medications in order to swallow.
IMPRESSION: 1. Esophageal dysmotility and spasm, similar to the previous exam.
2. Small hiatal hernia with suspected small Schatzki's ring. Caliber
difficult to assess, suspected mild narrowing.
3. Signs of potential cricopharyngeal bar similar to the previous
exam.
4. Study limited due to difficulty in swallowing the barium tablet
to further assess the distal esophagus.

## 2021-02-02 NOTE — Progress Notes (Signed)
Remote pacemaker transmission.   

## 2021-03-12 NOTE — Progress Notes (Signed)
Electrophysiology Office Note Date: 03/15/2021  ID:  DONNELLE OLMEDA, DOB 10/06/1929, MRN [id number]  PCP: Leanna Battles, MD Primary Cardiologist: Candee Furbish, MD Electrophysiologist: Thompson Grayer, MD   CC: Pacemaker follow-up  JAASIEL HOLLYFIELD is a 87 y.o. male seen today for Thompson Grayer, MD for routine electrophysiology followup.  Since last being seen in our clinic the patient reports doing very well.  he denies chest pain, palpitations, dyspnea, PND, orthopnea, nausea, vomiting, dizziness, syncope, edema, weight gain, or early satiety.  Device History: STJ dual chamber PPM implanted 2000 for complete heart block; gen change 2005; gen change 2014  Past Medical History:  Diagnosis Date   AVB (atrioventricular block)    HIGH DEGREE; s/p PPM implantation   BPH (benign prostatic hypertrophy)    Chest pain    Esophageal stricture    GERD (gastroesophageal reflux disease)    History of orthostatic hypotension    Hyperlipidemia    Pacemaker    Sinusitis    Syncope and collapse    Past Surgical History:  Procedure Laterality Date   CARDIAC CATHETERIZATION  06/2003   NORMAL CORONARIES   CHOLECYSTECTOMY     PACEMAKER GENERATOR CHANGE N/A 01/01/2013   Procedure: PACEMAKER GENERATOR CHANGE;  Surgeon: Thompson Grayer, MD;  Location: [facility] CATH LAB;  Service: Cardiovascular;  Laterality: N/A;   PACEMAKER INSERTION  11/1998; 12/2012   Medtronic [id number] implanted by Dr Verlon Setting in 2008; generator change to STJ Assurity 12/2012 by Dr Rayann Heman    Current Outpatient Medications  Medication Sig Dispense Refill   atorvastatin (LIPITOR) 40 MG tablet Take 1 tablet by mouth once daily 90 tablet 3   Cholecalciferol (VITAMIN D) 1000 UNITS capsule Take 1,000 Units by mouth daily.      flunisolide (NASALIDE) 25 MCG/ACT (0.025%) SOLN as needed.     Lysine 500 MG TABS Take 500 mg by mouth daily.      Multiple Vitamin (MULTIVITAMIN) tablet Take 1 tablet by mouth daily. It's a chewable tablet      pantoprazole (PROTONIX) 40 MG tablet Take 40 mg by mouth daily.      No current facility-administered medications for this visit.    Allergies:   Aspirin and Erythromycin   Social History: Social History   Socioeconomic History   Marital status: Married    Spouse name: Not on file   Number of children: 3   Years of education: Not on file   Highest education level: Not on file  Occupational History   Occupation: Retired  Tobacco Use   Smoking status: Never   Smokeless tobacco: Never  Vaping Use   Vaping Use: Never used  Substance and Sexual Activity   Alcohol use: No   Drug use: No   Sexual activity: Not on file  Other Topics Concern   Not on file  Social History Narrative   Not on file   Social Determinants of Health   Financial Resource Strain: Not on file  Food Insecurity: Not on file  Transportation Needs: Not on file  Physical Activity: Not on file  Stress: Not on file  Social Connections: Not on file  Intimate Partner Violence: Not on file    Family History: Family History  Problem Relation Age of Onset   Heart attack Brother    CVA Mother    Asthma Mother    Colon cancer Unknown        ?     Review of Systems: All other systems reviewed and are otherwise  negative except as noted above.  Physical Exam: Vitals:   03/15/21 0948  BP: 130/68  Pulse: 66  SpO2: 99%  Weight: 141 lb (64 kg)  Height: 5\' 10"  (1.778 m)     GEN- The patient is well appearing, alert and oriented x 3 today.   HEENT: normocephalic, atraumatic; sclera clear, conjunctiva pink; hearing intact; oropharynx clear; neck supple  Lungs- Clear to ausculation bilaterally, normal work of breathing.  No wheezes, rales, rhonchi Heart- Regular rate and rhythm, no murmurs, rubs or gallops  GI- soft, non-tender, non-distended, bowel sounds present  Extremities- no clubbing or cyanosis. No edema MS- no significant deformity or atrophy Skin- warm and dry, no rash or lesion; PPM pocket  well healed Psych- euthymic mood, full affect Neuro- strength and sensation are intact  PPM Interrogation- reviewed in detail today,  See PACEART report  EKG:  EKG is not ordered today. Personal review of ekg ordered  01/01/21  shows V paced at 67 bpm   Recent Labs: No results found for requested labs within last 8760 hours.   Wt Readings from Last 3 Encounters:  03/15/21 141 lb (64 kg)  01/01/21 144 lb (65.3 kg)  03/06/20 149 lb (67.6 kg)     Other studies Reviewed: Additional studies/ records that were reviewed today include: Previous EP office notes, Previous remote checks, Most recent labwork.   Assessment and Plan:  1. CHB s/p St. Jude PPM  Normal PPM function See Claudia Desanctis Art report No changes today This is his third R sided generator. At gen change, may need to consider moving system to left side (Originally done on R so he could shoot rifle, which he no longer does)  2. HTN Stable on current regimen   Current medicines are reviewed at length with the patient today.    Disposition:   Follow up with  EPMD  in 12 months    Signed, Shirley Friar, PA-C  03/15/2021 10:26 AM  [facility] [address] [address] [phone] (office) [phone] (fax)

## 2021-03-15 ENCOUNTER — Other Ambulatory Visit: Payer: Self-pay

## 2021-03-15 ENCOUNTER — Ambulatory Visit: Payer: Medicare HMO | Admitting: Student

## 2021-03-15 ENCOUNTER — Encounter: Payer: Self-pay | Admitting: Student

## 2021-03-15 VITALS — BP 130/68 | HR 66 | Ht 70.0 in | Wt 141.0 lb

## 2021-03-15 DIAGNOSIS — I10 Essential (primary) hypertension: Secondary | ICD-10-CM | POA: Diagnosis not present

## 2021-03-15 DIAGNOSIS — I442 Atrioventricular block, complete: Secondary | ICD-10-CM

## 2021-03-15 LAB — CUP PACEART INCLINIC DEVICE CHECK
Battery Remaining Longevity: 18 mo
Battery Voltage: 2.87 V
Brady Statistic RA Percent Paced: 38 %
Brady Statistic RV Percent Paced: 99.84 %
Date Time Interrogation Session: 20221024102321
Implantable Lead Implant Date: 20000712
Implantable Lead Implant Date: 20000712
Implantable Lead Location: 753859
Implantable Lead Location: 753860
Implantable Lead Model: 4460
Implantable Lead Model: 4461
Implantable Lead Serial Number: 200213
Implantable Lead Serial Number: 20312
Implantable Pulse Generator Implant Date: 20140812
Lead Channel Impedance Value: 387.5 Ohm
Lead Channel Impedance Value: 387.5 Ohm
Lead Channel Pacing Threshold Amplitude: 0.75 V
Lead Channel Pacing Threshold Amplitude: 0.75 V
Lead Channel Pacing Threshold Amplitude: 0.875 V
Lead Channel Pacing Threshold Pulse Width: 0.4 ms
Lead Channel Pacing Threshold Pulse Width: 0.4 ms
Lead Channel Pacing Threshold Pulse Width: 0.4 ms
Lead Channel Sensing Intrinsic Amplitude: 12 mV
Lead Channel Sensing Intrinsic Amplitude: 4.2 mV
Lead Channel Setting Pacing Amplitude: 1.125
Lead Channel Setting Pacing Amplitude: 2 V
Lead Channel Setting Pacing Pulse Width: 0.4 ms
Lead Channel Setting Sensing Sensitivity: 4 mV
Pulse Gen Model: 2240
Pulse Gen Serial Number: 2974994

## 2021-03-15 NOTE — Patient Instructions (Signed)
Medication Instructions:  Your physician recommends that you continue on your current medications as directed. Please refer to the Current Medication list given to you today.  *If you need a refill on your cardiac medications before your next appointment, please call your pharmacy*   Lab Work: None If you have labs (blood work) drawn today and your tests are completely normal, you will receive your results only by: [place] (if you have MyChart) OR A paper copy in the mail If you have any lab test that is abnormal or we need to change your treatment, we will call you to review the results.   Follow-Up: At [facility], you and your health needs are our priority.  As part of our continuing mission to provide you with exceptional heart care, we have created designated Provider Care Teams.  These Care Teams include your primary Cardiologist (physician) and Advanced Practice Providers (APPs -  Physician Assistants and Nurse Practitioners) who all work together to provide you with the care you need, when you need it.  We recommend signing up for the patient portal called "MyChart".  Sign up information is provided on this After Visit Summary.  MyChart is used to connect with patients for Virtual Visits (Telemedicine).  Patients are able to view lab/test results, encounter notes, upcoming appointments, etc.  Non-urgent messages can be sent to your provider as well.   To learn more about what you can do with MyChart, go to [email or web address].    Your next appointment:   1 year(s)  The format for your next appointment:   In Person  Provider:    Thompson Grayer, MD

## 2021-04-20 DIAGNOSIS — E785 Hyperlipidemia, unspecified: Secondary | ICD-10-CM | POA: Diagnosis not present

## 2021-04-20 DIAGNOSIS — Z125 Encounter for screening for malignant neoplasm of prostate: Secondary | ICD-10-CM | POA: Diagnosis not present

## 2021-04-20 DIAGNOSIS — I10 Essential (primary) hypertension: Secondary | ICD-10-CM | POA: Diagnosis not present

## 2021-04-21 ENCOUNTER — Ambulatory Visit (INDEPENDENT_AMBULATORY_CARE_PROVIDER_SITE_OTHER): Payer: Medicare HMO

## 2021-04-21 DIAGNOSIS — I442 Atrioventricular block, complete: Secondary | ICD-10-CM

## 2021-04-21 LAB — CUP PACEART REMOTE DEVICE CHECK
Battery Remaining Longevity: 17 mo
Battery Remaining Percentage: 13 %
Battery Voltage: 2.86 V
Brady Statistic AP VP Percent: 36 %
Brady Statistic AP VS Percent: 1 %
Brady Statistic AS VP Percent: 63 %
Brady Statistic AS VS Percent: 1 %
Brady Statistic RA Percent Paced: 36 %
Brady Statistic RV Percent Paced: 99 %
Date Time Interrogation Session: 20221130020012
Implantable Lead Implant Date: 20000712
Implantable Lead Implant Date: 20000712
Implantable Lead Location: 753859
Implantable Lead Location: 753860
Implantable Lead Model: 4460
Implantable Lead Model: 4461
Implantable Lead Serial Number: 200213
Implantable Lead Serial Number: 20312
Implantable Pulse Generator Implant Date: 20140812
Lead Channel Impedance Value: 400 Ohm
Lead Channel Impedance Value: 440 Ohm
Lead Channel Pacing Threshold Amplitude: 0.75 V
Lead Channel Pacing Threshold Amplitude: 0.875 V
Lead Channel Pacing Threshold Pulse Width: 0.4 ms
Lead Channel Pacing Threshold Pulse Width: 0.4 ms
Lead Channel Sensing Intrinsic Amplitude: 12 mV
Lead Channel Sensing Intrinsic Amplitude: 5 mV
Lead Channel Setting Pacing Amplitude: 1.125
Lead Channel Setting Pacing Amplitude: 2 V
Lead Channel Setting Pacing Pulse Width: 0.4 ms
Lead Channel Setting Sensing Sensitivity: 4 mV
Pulse Gen Model: 2240
Pulse Gen Serial Number: 2974994

## 2021-04-27 DIAGNOSIS — R82998 Other abnormal findings in urine: Secondary | ICD-10-CM | POA: Diagnosis not present

## 2021-04-27 DIAGNOSIS — I10 Essential (primary) hypertension: Secondary | ICD-10-CM | POA: Diagnosis not present

## 2021-04-27 DIAGNOSIS — Z950 Presence of cardiac pacemaker: Secondary | ICD-10-CM | POA: Diagnosis not present

## 2021-04-27 DIAGNOSIS — R1314 Dysphagia, pharyngoesophageal phase: Secondary | ICD-10-CM | POA: Diagnosis not present

## 2021-04-27 DIAGNOSIS — K219 Gastro-esophageal reflux disease without esophagitis: Secondary | ICD-10-CM | POA: Diagnosis not present

## 2021-04-27 DIAGNOSIS — Z0000 Encounter for general adult medical examination without abnormal findings: Secondary | ICD-10-CM | POA: Diagnosis not present

## 2021-04-27 DIAGNOSIS — E785 Hyperlipidemia, unspecified: Secondary | ICD-10-CM | POA: Diagnosis not present

## 2021-04-27 DIAGNOSIS — H903 Sensorineural hearing loss, bilateral: Secondary | ICD-10-CM | POA: Diagnosis not present

## 2021-04-30 NOTE — Progress Notes (Signed)
Remote pacemaker transmission.   

## 2021-07-06 DIAGNOSIS — H52203 Unspecified astigmatism, bilateral: Secondary | ICD-10-CM | POA: Diagnosis not present

## 2021-07-06 DIAGNOSIS — H26492 Other secondary cataract, left eye: Secondary | ICD-10-CM | POA: Diagnosis not present

## 2021-07-06 DIAGNOSIS — H40013 Open angle with borderline findings, low risk, bilateral: Secondary | ICD-10-CM | POA: Diagnosis not present

## 2021-07-06 DIAGNOSIS — H43813 Vitreous degeneration, bilateral: Secondary | ICD-10-CM | POA: Diagnosis not present

## 2021-07-21 ENCOUNTER — Ambulatory Visit (INDEPENDENT_AMBULATORY_CARE_PROVIDER_SITE_OTHER): Payer: Medicare HMO

## 2021-07-21 DIAGNOSIS — I442 Atrioventricular block, complete: Secondary | ICD-10-CM | POA: Diagnosis not present

## 2021-07-21 LAB — CUP PACEART REMOTE DEVICE CHECK
Battery Remaining Longevity: 13 mo
Battery Remaining Percentage: 11 %
Battery Voltage: 2.84 V
Brady Statistic AP VP Percent: 43 %
Brady Statistic AP VS Percent: 1 %
Brady Statistic AS VP Percent: 57 %
Brady Statistic AS VS Percent: 1 %
Brady Statistic RA Percent Paced: 42 %
Brady Statistic RV Percent Paced: 99 %
Date Time Interrogation Session: 20230301020013
Implantable Lead Implant Date: 20000712
Implantable Lead Implant Date: 20000712
Implantable Lead Location: 753859
Implantable Lead Location: 753860
Implantable Lead Model: 4460
Implantable Lead Model: 4461
Implantable Lead Serial Number: 200213
Implantable Lead Serial Number: 20312
Implantable Pulse Generator Implant Date: 20140812
Lead Channel Impedance Value: 390 Ohm
Lead Channel Impedance Value: 390 Ohm
Lead Channel Pacing Threshold Amplitude: 0.625 V
Lead Channel Pacing Threshold Amplitude: 0.75 V
Lead Channel Pacing Threshold Pulse Width: 0.4 ms
Lead Channel Pacing Threshold Pulse Width: 0.4 ms
Lead Channel Sensing Intrinsic Amplitude: 10.4 mV
Lead Channel Sensing Intrinsic Amplitude: 4.7 mV
Lead Channel Setting Pacing Amplitude: 0.875
Lead Channel Setting Pacing Amplitude: 2 V
Lead Channel Setting Pacing Pulse Width: 0.4 ms
Lead Channel Setting Sensing Sensitivity: 4 mV
Pulse Gen Model: 2240
Pulse Gen Serial Number: 2974994

## 2021-07-29 NOTE — Progress Notes (Signed)
Remote pacemaker transmission.   

## 2021-10-20 ENCOUNTER — Ambulatory Visit (INDEPENDENT_AMBULATORY_CARE_PROVIDER_SITE_OTHER): Payer: Medicare HMO

## 2021-10-20 DIAGNOSIS — I442 Atrioventricular block, complete: Secondary | ICD-10-CM

## 2021-10-21 LAB — CUP PACEART REMOTE DEVICE CHECK
Battery Remaining Longevity: 14 mo
Battery Remaining Percentage: 11 %
Battery Voltage: 2.84 V
Brady Statistic AP VP Percent: 42 %
Brady Statistic AP VS Percent: 1 %
Brady Statistic AS VP Percent: 57 %
Brady Statistic AS VS Percent: 1 %
Brady Statistic RA Percent Paced: 42 %
Brady Statistic RV Percent Paced: 99 %
Date Time Interrogation Session: 20230531021608
Implantable Lead Implant Date: 20000712
Implantable Lead Implant Date: 20000712
Implantable Lead Location: 753859
Implantable Lead Location: 753860
Implantable Lead Model: 4460
Implantable Lead Model: 4461
Implantable Lead Serial Number: 200213
Implantable Lead Serial Number: 20312
Implantable Pulse Generator Implant Date: 20140812
Lead Channel Impedance Value: 400 Ohm
Lead Channel Impedance Value: 410 Ohm
Lead Channel Pacing Threshold Amplitude: 0.75 V
Lead Channel Pacing Threshold Amplitude: 0.75 V
Lead Channel Pacing Threshold Pulse Width: 0.4 ms
Lead Channel Pacing Threshold Pulse Width: 0.4 ms
Lead Channel Sensing Intrinsic Amplitude: 11 mV
Lead Channel Sensing Intrinsic Amplitude: 3.5 mV
Lead Channel Setting Pacing Amplitude: 1 V
Lead Channel Setting Pacing Amplitude: 2 V
Lead Channel Setting Pacing Pulse Width: 0.4 ms
Lead Channel Setting Sensing Sensitivity: 4 mV
Pulse Gen Model: 2240
Pulse Gen Serial Number: 2974994

## 2021-11-02 NOTE — Progress Notes (Signed)
Remote pacemaker transmission.   

## 2021-12-20 ENCOUNTER — Other Ambulatory Visit: Payer: Self-pay | Admitting: Cardiology

## 2022-01-18 DIAGNOSIS — X32XXXD Exposure to sunlight, subsequent encounter: Secondary | ICD-10-CM | POA: Diagnosis not present

## 2022-01-18 DIAGNOSIS — D225 Melanocytic nevi of trunk: Secondary | ICD-10-CM | POA: Diagnosis not present

## 2022-01-18 DIAGNOSIS — L570 Actinic keratosis: Secondary | ICD-10-CM | POA: Diagnosis not present

## 2022-01-18 DIAGNOSIS — Z1283 Encounter for screening for malignant neoplasm of skin: Secondary | ICD-10-CM | POA: Diagnosis not present

## 2022-01-19 ENCOUNTER — Ambulatory Visit (INDEPENDENT_AMBULATORY_CARE_PROVIDER_SITE_OTHER): Payer: Medicare HMO

## 2022-01-19 DIAGNOSIS — I442 Atrioventricular block, complete: Secondary | ICD-10-CM

## 2022-01-19 LAB — CUP PACEART REMOTE DEVICE CHECK
Battery Remaining Longevity: 12 mo
Battery Remaining Percentage: 10 %
Battery Voltage: 2.83 V
Brady Statistic AP VP Percent: 43 %
Brady Statistic AP VS Percent: 1 %
Brady Statistic AS VP Percent: 57 %
Brady Statistic AS VS Percent: 1 %
Brady Statistic RA Percent Paced: 43 %
Brady Statistic RV Percent Paced: 99 %
Date Time Interrogation Session: 20230830020022
Implantable Lead Implant Date: 20000712
Implantable Lead Implant Date: 20000712
Implantable Lead Location: 753859
Implantable Lead Location: 753860
Implantable Lead Model: 4460
Implantable Lead Model: 4461
Implantable Lead Serial Number: 200213
Implantable Lead Serial Number: 20312
Implantable Pulse Generator Implant Date: 20140812
Lead Channel Impedance Value: 390 Ohm
Lead Channel Impedance Value: 390 Ohm
Lead Channel Pacing Threshold Amplitude: 0.75 V
Lead Channel Pacing Threshold Amplitude: 1 V
Lead Channel Pacing Threshold Pulse Width: 0.4 ms
Lead Channel Pacing Threshold Pulse Width: 0.4 ms
Lead Channel Sensing Intrinsic Amplitude: 11 mV
Lead Channel Sensing Intrinsic Amplitude: 3.1 mV
Lead Channel Setting Pacing Amplitude: 1.25 V
Lead Channel Setting Pacing Amplitude: 2 V
Lead Channel Setting Pacing Pulse Width: 0.4 ms
Lead Channel Setting Sensing Sensitivity: 4 mV
Pulse Gen Model: 2240
Pulse Gen Serial Number: 2974994

## 2022-02-11 NOTE — Progress Notes (Signed)
Remote pacemaker transmission.   

## 2022-02-12 ENCOUNTER — Other Ambulatory Visit: Payer: Self-pay | Admitting: Cardiology

## 2022-03-12 DIAGNOSIS — Z23 Encounter for immunization: Secondary | ICD-10-CM | POA: Diagnosis not present

## 2022-04-20 ENCOUNTER — Ambulatory Visit (INDEPENDENT_AMBULATORY_CARE_PROVIDER_SITE_OTHER): Payer: Medicare HMO

## 2022-04-20 DIAGNOSIS — I442 Atrioventricular block, complete: Secondary | ICD-10-CM

## 2022-04-20 LAB — CUP PACEART REMOTE DEVICE CHECK
Battery Remaining Longevity: 9 mo
Battery Remaining Percentage: 7 %
Battery Voltage: 2.78 V
Brady Statistic AP VP Percent: 44 %
Brady Statistic AP VS Percent: 1 %
Brady Statistic AS VP Percent: 56 %
Brady Statistic AS VS Percent: 1 %
Brady Statistic RA Percent Paced: 44 %
Brady Statistic RV Percent Paced: 99 %
Date Time Interrogation Session: 20231129020015
Implantable Lead Connection Status: 753985
Implantable Lead Connection Status: 753985
Implantable Lead Implant Date: 20000712
Implantable Lead Implant Date: 20000712
Implantable Lead Location: 753859
Implantable Lead Location: 753860
Implantable Lead Model: 4460
Implantable Lead Model: 4461
Implantable Lead Serial Number: 200213
Implantable Lead Serial Number: 20312
Implantable Pulse Generator Implant Date: 20140812
Lead Channel Impedance Value: 380 Ohm
Lead Channel Impedance Value: 410 Ohm
Lead Channel Pacing Threshold Amplitude: 0.75 V
Lead Channel Pacing Threshold Amplitude: 1 V
Lead Channel Pacing Threshold Pulse Width: 0.4 ms
Lead Channel Pacing Threshold Pulse Width: 0.4 ms
Lead Channel Sensing Intrinsic Amplitude: 12 mV
Lead Channel Sensing Intrinsic Amplitude: 2.9 mV
Lead Channel Setting Pacing Amplitude: 1.25 V
Lead Channel Setting Pacing Amplitude: 2 V
Lead Channel Setting Pacing Pulse Width: 0.4 ms
Lead Channel Setting Sensing Sensitivity: 4 mV
Pulse Gen Model: 2240
Pulse Gen Serial Number: 2974994

## 2022-04-28 ENCOUNTER — Encounter: Payer: Self-pay | Admitting: Cardiology

## 2022-04-28 ENCOUNTER — Ambulatory Visit: Payer: Medicare HMO | Attending: Cardiology | Admitting: Cardiology

## 2022-04-28 ENCOUNTER — Other Ambulatory Visit: Payer: Self-pay | Admitting: Cardiology

## 2022-04-28 VITALS — BP 154/80 | HR 55 | Ht 70.0 in | Wt 140.0 lb

## 2022-04-28 DIAGNOSIS — R001 Bradycardia, unspecified: Secondary | ICD-10-CM | POA: Diagnosis not present

## 2022-04-28 DIAGNOSIS — Z950 Presence of cardiac pacemaker: Secondary | ICD-10-CM | POA: Diagnosis not present

## 2022-04-28 DIAGNOSIS — I10 Essential (primary) hypertension: Secondary | ICD-10-CM | POA: Diagnosis not present

## 2022-04-28 DIAGNOSIS — E785 Hyperlipidemia, unspecified: Secondary | ICD-10-CM

## 2022-04-28 DIAGNOSIS — I442 Atrioventricular block, complete: Secondary | ICD-10-CM | POA: Diagnosis not present

## 2022-04-28 NOTE — Telephone Encounter (Signed)
Rx refill sent to pharmacy. 

## 2022-04-28 NOTE — Progress Notes (Signed)
Cardiology Office Note:    Date:  04/28/2022   ID:  Charles Olson, DOB 12-18-1929, MRN [id number]  PCP:  Donnajean Lopes, MD  Cardiologist:  Candee Furbish, MD  Electrophysiologist:  Thompson Grayer, MD   Referring MD: Donnajean Lopes, MD    History of Present Illness:    Charles Olson is a 88 y.o. male here for the follow-up of complete heart block and hypertension.  Saint Jude dual-chamber pacemaker implanted in 2000 with generator change in 2014.    Former patient of Dr. Sherryl Barters.  He did recall previously that when he had his pacemaker changed he felt funny as the wires are being exchanged and his heart rate paused.  He and his wife live at friend's home. His wife goes by [employer].  She is having carotid endarterectomy soon.  Worried.  He enjoyed being "[name] "throughout his 42s.  Worked into his mid 19s.  He has been enjoying pickleball at friend's home.  Has not hurt himself.  Has seen Oda Kilts for device check.  Functioning well.  Pacemaker, [employer] doing well.  He is on his third right-sided generator.  May need to consider moving to the left side.  He was originally on the right side so he could shoot his rifle.   Past Medical History:  Diagnosis Date   AVB (atrioventricular block)    HIGH DEGREE; s/p PPM implantation   BPH (benign prostatic hypertrophy)    Chest pain    Esophageal stricture    GERD (gastroesophageal reflux disease)    History of orthostatic hypotension    Hyperlipidemia    Pacemaker    Sinusitis    Syncope and collapse     Past Surgical History:  Procedure Laterality Date   CARDIAC CATHETERIZATION  06/2003   NORMAL CORONARIES   CHOLECYSTECTOMY     PACEMAKER GENERATOR CHANGE N/A 01/01/2013   Procedure: PACEMAKER GENERATOR CHANGE;  Surgeon: Thompson Grayer, MD;  Location: [facility] CATH LAB;  Service: Cardiovascular;  Laterality: N/A;   PACEMAKER INSERTION  11/1998; 12/2012   Medtronic [id number] implanted by Dr Verlon Setting in 2008; generator change to  STJ Assurity 12/2012 by Dr Rayann Heman    Current Medications: Current Meds  Medication Sig   atorvastatin (LIPITOR) 40 MG tablet Take 1 tablet (40 mg total) by mouth daily.   Cholecalciferol (VITAMIN D) 1000 UNITS capsule Take 1,000 Units by mouth daily.    flunisolide (NASALIDE) 25 MCG/ACT (0.025%) SOLN as needed.   Lysine 500 MG TABS Take 500 mg by mouth daily.    Multiple Vitamin (MULTIVITAMIN) tablet Take 1 tablet by mouth daily. It's a chewable tablet   pantoprazole (PROTONIX) 40 MG tablet Take 40 mg by mouth daily.      Allergies:   Aspirin and Erythromycin   Social History   Socioeconomic History   Marital status: Married    Spouse name: Not on file   Number of children: 3   Years of education: Not on file   Highest education level: Not on file  Occupational History   Occupation: Retired  Tobacco Use   Smoking status: Never   Smokeless tobacco: Never  Vaping Use   Vaping Use: Never used  Substance and Sexual Activity   Alcohol use: No   Drug use: No   Sexual activity: Not on file  Other Topics Concern   Not on file  Social History Narrative   Not on file   Social Determinants of Health   Financial  Resource Strain: Not on file  Food Insecurity: Not on file  Transportation Needs: Not on file  Physical Activity: Not on file  Stress: Not on file  Social Connections: Not on file     Family History: The patient's family history includes Asthma in his mother; CVA in his mother; Colon cancer in his unknown relative; Heart attack in his brother.  ROS: Please see the history of present illness. All other systems are reviewed and negative.    EKGs/Labs/Other Studies Reviewed:   No recent CV studies available.  EKG:    04/28/2022-heart rate 55 bpm AV pacing. 01/01/2021: NSR with ventricular pacing. 67 bpm 09/18/2019: ventricular paced rhythm  Recent Labs: No results found for requested labs within last 365 days.  Recent Lipid Panel No results found for: "CHOL",  "TRIG", "HDL", "CHOLHDL", "VLDL", "LDLCALC", "LDLDIRECT"  Physical Exam:    VS:  BP (!) 154/80 (BP Location: Left Arm, Patient Position: Sitting, Cuff Size: Normal)   Pulse (!) 55   Ht '5\' 10"'$  (1.778 m)   Wt 140 lb (63.5 kg)   BMI 20.09 kg/m     Wt Readings from Last 3 Encounters:  04/28/22 140 lb (63.5 kg)  03/15/21 141 lb (64 kg)  01/01/21 144 lb (65.3 kg)    GEN: Well nourished, well developed, in no acute distress HEENT: normal Neck: no JVD, carotid bruits, or masses Cardiac: RRR; no murmurs, rubs, or gallops,no edema  Respiratory:  clear to auscultation bilaterally, normal work of breathing GI: soft, nontender, nondistended, + BS MS: no deformity or atrophy Skin: warm and dry, no rash Neuro:  Alert and Oriented x 3, Strength and sensation are intact Psych: euthymic mood, full affect   ASSESSMENT:    1. Complete heart block (HCC)   2. Cardiac pacemaker in situ   3. Bradycardia   4. Essential hypertension, benign   5. Dyslipidemia      PLAN:   In order of problems listed above:  Complete heart block ([place]) Continue with [place] permanent pacemaker.  26 year total.  He is ventricular paced on his ECG.  Sinus rhythm otherwise.  Doing very well.  This is his third pacemaker he states.  Enjoying pickleball at [facility].  He understands he is pacemaker dependent.   Essential hypertension, benign Blood pressure currently well controlled.  Occasionally may have a degree of whitecoat hypertension. VA as well.   Doing well.   Dyslipidemia Doing well.  On atorvastatin doing well atorvastatin 40 mg a day.  No changes made.  No myalgias.  Continue with current medical management.  Last LDL 71.   Follow-up: 1 year.  Medication Adjustments/Labs and Tests Ordered: Current medicines are reviewed at length with the patient today.  Concerns regarding medicines are outlined above.   Orders Placed This Encounter  Procedures   EKG 12-Lead    No orders of the defined  types were placed in this encounter.   Patient Instructions  Medication Instructions:  The current medical regimen is effective;  continue present plan and medications.  *If you need a refill on your cardiac medications before your next appointment, please call your pharmacy*  Follow-Up: At [facility], you and your health needs are our priority.  As part of our continuing mission to provide you with exceptional heart care, we have created designated Provider Care Teams.  These Care Teams include your primary Cardiologist (physician) and Advanced Practice Providers (APPs -  Physician Assistants and Nurse Practitioners) who all work  together to provide you with the care you need, when you need it.  We recommend signing up for the patient portal called "MyChart".  Sign up information is provided on this After Visit Summary.  MyChart is used to connect with patients for Virtual Visits (Telemedicine).  Patients are able to view lab/test results, encounter notes, upcoming appointments, etc.  Non-urgent messages can be sent to your provider as well.   To learn more about what you can do with MyChart, go to [email or web address].    Your next appointment:   1 year(s)  The format for your next appointment:   In Person  Provider:   Candee Furbish, MD      Important Information About Sugar         I,Mathew Stumpf,acting as a scribe for Candee Furbish, MD.,have documented all relevant documentation on the behalf of Candee Furbish, MD,as directed by  Candee Furbish, MD while in the presence of Candee Furbish, MD.  I, Candee Furbish, MD, have reviewed all documentation for this visit. The documentation on 04/28/22 for the exam, diagnosis, procedures, and orders are all accurate and complete.   Signed, Candee Furbish, MD  04/28/2022 9:15 AM    [place]

## 2022-04-28 NOTE — Patient Instructions (Signed)
Medication Instructions:  The current medical regimen is effective;  continue present plan and medications.  *If you need a refill on your cardiac medications before your next appointment, please call your pharmacy*  Follow-Up: At [facility], you and your health needs are our priority.  As part of our continuing mission to provide you with exceptional heart care, we have created designated Provider Care Teams.  These Care Teams include your primary Cardiologist (physician) and Advanced Practice Providers (APPs -  Physician Assistants and Nurse Practitioners) who all work together to provide you with the care you need, when you need it.  We recommend signing up for the patient portal called "MyChart".  Sign up information is provided on this After Visit Summary.  MyChart is used to connect with patients for Virtual Visits (Telemedicine).  Patients are able to view lab/test results, encounter notes, upcoming appointments, etc.  Non-urgent messages can be sent to your provider as well.   To learn more about what you can do with MyChart, go to [email or web address].    Your next appointment:   1 year(s)  The format for your next appointment:   In Person  Provider:   Mark Skains, MD      Important Information About Sugar       

## 2022-05-02 ENCOUNTER — Ambulatory Visit: Payer: Medicare HMO | Attending: Internal Medicine | Admitting: Internal Medicine

## 2022-05-02 VITALS — BP 150/70 | HR 59 | Wt 138.0 lb

## 2022-05-02 DIAGNOSIS — Z950 Presence of cardiac pacemaker: Secondary | ICD-10-CM | POA: Diagnosis not present

## 2022-05-02 DIAGNOSIS — I10 Essential (primary) hypertension: Secondary | ICD-10-CM | POA: Diagnosis not present

## 2022-05-02 DIAGNOSIS — I442 Atrioventricular block, complete: Secondary | ICD-10-CM

## 2022-05-02 NOTE — Progress Notes (Signed)
HPI Charles Olson is a 88 y.o. male here for the follow-up of complete heart block and hypertension and to establish with me, formerly a Dr. 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  Former patient of Dr. Sherryl Barters.  He did recall previously that when he had his pacemaker changed he felt funny as the wires are being exchanged and his heart rate paused. He is about 8 months from ERI. Despite his advanced age he feels well. No chest pain or sob. He is still playing pickle ball.   He and his wife live at friend's home. Allergies  Allergen Reactions   Aspirin Anaphylaxis and Other (See Comments)    Swells severely internally.   Erythromycin     Sneezing - Tolerates Azithromycin     Current Outpatient Medications  Medication Sig Dispense Refill   atorvastatin (LIPITOR) 40 MG tablet Take 1 tablet (40 mg total) by mouth daily. 90 tablet 3   Cholecalciferol (VITAMIN D) 1000 UNITS capsule Take 1,000 Units by mouth daily.      flunisolide (NASALIDE) 25 MCG/ACT (0.025%) SOLN as needed.     Lysine 500 MG TABS Take 500 mg by mouth daily.      Multiple Vitamin (MULTIVITAMIN) tablet Take 1 tablet by mouth daily. It's a chewable tablet     pantoprazole (PROTONIX) 40 MG tablet Take 40 mg by mouth daily.      No current facility-administered medications for this visit.     Past Medical History:  Diagnosis Date   AVB (atrioventricular block)    HIGH DEGREE; s/p PPM implantation   BPH (benign prostatic hypertrophy)    Chest pain    Esophageal stricture    GERD (gastroesophageal reflux disease)    History of orthostatic hypotension    Hyperlipidemia    Pacemaker    Sinusitis    Syncope and collapse     ROS:   All systems reviewed and negative except as noted in the HPI.   Past Surgical History:  Procedure Laterality Date   CARDIAC CATHETERIZATION  06/2003   NORMAL  CORONARIES   CHOLECYSTECTOMY     PACEMAKER GENERATOR CHANGE N/A 01/01/2013   Procedure: PACEMAKER GENERATOR CHANGE;  Surgeon: Thompson Grayer, MD;  Location: [facility] CATH LAB;  Service: Cardiovascular;  Laterality: N/A;   PACEMAKER INSERTION  11/1998; 12/2012   Medtronic [id number] implanted by Dr Verlon Setting in 2008; generator change to STJ Assurity 12/2012 by Dr Rayann Heman     Family History  Problem Relation Age of Onset   Heart attack Brother    CVA Mother    Asthma Mother    Colon cancer Unknown        ?     Social History   Socioeconomic History   Marital status: Married    Spouse name: Not on file   Number of children: 3   Years of education: Not on file   Highest education level: Not on file  Occupational History   Occupation: Retired  Tobacco Use   Smoking status: Never   Smokeless tobacco: Never  Vaping Use   Vaping Use: Never used  Substance and Sexual Activity   Alcohol use: No   Drug use: No   Sexual activity: Not on file  Other Topics Concern   Not on file  Social History Narrative   Not on file   Social Determinants  of Health   Financial Resource Strain: Not on file  Food Insecurity: Not on file  Transportation Needs: Not on file  Physical Activity: Not on file  Stress: Not on file  Social Connections: Not on file  Intimate Partner Violence: Not on file     BP (!) 150/70 (BP Location: Left Arm)   Pulse (!) 59   Wt 138 lb (62.6 kg)   SpO2 96%   BMI 19.80 kg/m   Physical Exam:  Well appearing elderly man, NAD HEENT: Unremarkable Neck:  No JVD, no thyromegally Lymphatics:  No adenopathy Back:  No CVA tenderness Lungs:  Clear HEART:  Regular rate rhythm, no murmurs, no rubs, no clicks Abd:  soft, positive bowel sounds, no organomegally, no rebound, no guarding Ext:  2 plus pulses, no edema, no cyanosis, no clubbing Skin:  No rashes no nodules Neuro:  CN II through XII intact, motor grossly intact  DEVICE  Normal device function.  See PaceArt for details.    Assess/Plan: CHB - he is asymptomatic s/p PPM insertion. PPM - his St. Jude DDD PM is working normally. HTN - his bp is up a bit but better at home. Dyslipidemia - he will continue statin therapy.  Carleene Overlie Neely Kammerer,MD

## 2022-05-02 NOTE — Patient Instructions (Addendum)
Medication Instructions:  Your physician recommends that you continue on your current medications as directed. Please refer to the Current Medication list given to you today.  *If you need a refill on your cardiac medications before your next appointment, please call your pharmacy*  Lab Work: None ordered.  If you have labs (blood work) drawn today and your tests are completely normal, you will receive your results only by: [place] (if you have MyChart) OR A paper copy in the mail If you have any lab test that is abnormal or we need to change your treatment, we will call you to review the results.  Testing/Procedures: None ordered.  Follow-Up: At [facility], you and your health needs are our priority.  As part of our continuing mission to provide you with exceptional heart care, we have created designated Provider Care Teams.  These Care Teams include your primary Cardiologist (physician) and Advanced Practice Providers (APPs -  Physician Assistants and Nurse Practitioners) who all work together to provide you with the care you need, when you need it.  We recommend signing up for the patient portal called "MyChart".  Sign up information is provided on this After Visit Summary.  MyChart is used to connect with patients for Virtual Visits (Telemedicine).  Patients are able to view lab/test results, encounter notes, upcoming appointments, etc.  Non-urgent messages can be sent to your provider as well.   To learn more about what you can do with MyChart, go to [email or web address].    Your next appointment:   Please schedule a 7 month follow up appointment to assess ERI status, with Mr. Charles Kilts, PA-C; Thank you.  The format for your next appointment:   In Person  Provider:   Cristopher Peru, MD{or one of the following Advanced Practice Providers on your designated Care Team:   Tommye Standard, [place] Legrand Como "Jonni Sanger" Chalmers Cater, [place]  Remote monitoring is used to monitor your  Pacemaker from home. This monitoring reduces the number of office visits required to check your device to one time per year. It allows [place] to keep an eye on the functioning of your device to ensure it is working properly. You are scheduled for a device check from home on 07/20/22. You may send your transmission at any time that day. If you have a wireless device, the transmission will be sent automatically. After your physician reviews your transmission, you will receive a postcard with your next transmission date.  Important Information About Sugar

## 2022-05-18 NOTE — Progress Notes (Signed)
Remote pacemaker transmission.   

## 2022-06-09 DIAGNOSIS — I10 Essential (primary) hypertension: Secondary | ICD-10-CM | POA: Diagnosis not present

## 2022-06-09 DIAGNOSIS — E785 Hyperlipidemia, unspecified: Secondary | ICD-10-CM | POA: Diagnosis not present

## 2022-06-09 DIAGNOSIS — R7989 Other specified abnormal findings of blood chemistry: Secondary | ICD-10-CM | POA: Diagnosis not present

## 2022-06-09 DIAGNOSIS — Z125 Encounter for screening for malignant neoplasm of prostate: Secondary | ICD-10-CM | POA: Diagnosis not present

## 2022-06-16 DIAGNOSIS — R972 Elevated prostate specific antigen [PSA]: Secondary | ICD-10-CM | POA: Diagnosis not present

## 2022-06-16 DIAGNOSIS — Z950 Presence of cardiac pacemaker: Secondary | ICD-10-CM | POA: Diagnosis not present

## 2022-06-16 DIAGNOSIS — R82998 Other abnormal findings in urine: Secondary | ICD-10-CM | POA: Diagnosis not present

## 2022-06-16 DIAGNOSIS — I10 Essential (primary) hypertension: Secondary | ICD-10-CM | POA: Diagnosis not present

## 2022-06-16 DIAGNOSIS — E785 Hyperlipidemia, unspecified: Secondary | ICD-10-CM | POA: Diagnosis not present

## 2022-06-16 DIAGNOSIS — D329 Benign neoplasm of meninges, unspecified: Secondary | ICD-10-CM | POA: Diagnosis not present

## 2022-06-16 DIAGNOSIS — Z0000 Encounter for general adult medical examination without abnormal findings: Secondary | ICD-10-CM | POA: Diagnosis not present

## 2022-06-16 DIAGNOSIS — H903 Sensorineural hearing loss, bilateral: Secondary | ICD-10-CM | POA: Diagnosis not present

## 2022-07-12 DIAGNOSIS — H524 Presbyopia: Secondary | ICD-10-CM | POA: Diagnosis not present

## 2022-07-12 DIAGNOSIS — H26493 Other secondary cataract, bilateral: Secondary | ICD-10-CM | POA: Diagnosis not present

## 2022-07-12 DIAGNOSIS — H5213 Myopia, bilateral: Secondary | ICD-10-CM | POA: Diagnosis not present

## 2022-07-12 DIAGNOSIS — H43813 Vitreous degeneration, bilateral: Secondary | ICD-10-CM | POA: Diagnosis not present

## 2022-07-12 DIAGNOSIS — H52203 Unspecified astigmatism, bilateral: Secondary | ICD-10-CM | POA: Diagnosis not present

## 2022-07-12 DIAGNOSIS — H40013 Open angle with borderline findings, low risk, bilateral: Secondary | ICD-10-CM | POA: Diagnosis not present

## 2022-07-20 ENCOUNTER — Ambulatory Visit: Payer: Medicare HMO

## 2022-07-20 ENCOUNTER — Telehealth: Payer: Self-pay

## 2022-07-20 DIAGNOSIS — R42 Dizziness and giddiness: Secondary | ICD-10-CM | POA: Diagnosis not present

## 2022-07-20 DIAGNOSIS — I10 Essential (primary) hypertension: Secondary | ICD-10-CM | POA: Diagnosis not present

## 2022-07-20 DIAGNOSIS — I442 Atrioventricular block, complete: Secondary | ICD-10-CM | POA: Diagnosis not present

## 2022-07-20 DIAGNOSIS — Z950 Presence of cardiac pacemaker: Secondary | ICD-10-CM | POA: Diagnosis not present

## 2022-07-20 LAB — CUP PACEART REMOTE DEVICE CHECK
Battery Remaining Longevity: 5 mo
Battery Remaining Percentage: 4 %
Battery Voltage: 2.72 V
Brady Statistic AP VP Percent: 45 %
Brady Statistic AP VS Percent: 1 %
Brady Statistic AS VP Percent: 55 %
Brady Statistic AS VS Percent: 1 %
Brady Statistic RA Percent Paced: 45 %
Brady Statistic RV Percent Paced: 99 %
Date Time Interrogation Session: 20240228020012
Implantable Lead Connection Status: 753985
Implantable Lead Connection Status: 753985
Implantable Lead Implant Date: 20000712
Implantable Lead Implant Date: 20000712
Implantable Lead Location: 753859
Implantable Lead Location: 753860
Implantable Lead Model: 4460
Implantable Lead Model: 4461
Implantable Lead Serial Number: 200213
Implantable Lead Serial Number: 20312
Implantable Pulse Generator Implant Date: 20140812
Lead Channel Impedance Value: 380 Ohm
Lead Channel Impedance Value: 380 Ohm
Lead Channel Pacing Threshold Amplitude: 0.75 V
Lead Channel Pacing Threshold Amplitude: 0.875 V
Lead Channel Pacing Threshold Pulse Width: 0.4 ms
Lead Channel Pacing Threshold Pulse Width: 0.4 ms
Lead Channel Sensing Intrinsic Amplitude: 4.4 mV
Lead Channel Sensing Intrinsic Amplitude: 8.7 mV
Lead Channel Setting Pacing Amplitude: 1.125
Lead Channel Setting Pacing Amplitude: 2 V
Lead Channel Setting Pacing Pulse Width: 0.4 ms
Lead Channel Setting Sensing Sensitivity: 4 mV
Pulse Gen Model: 2240
Pulse Gen Serial Number: 2974994

## 2022-07-20 NOTE — Telephone Encounter (Signed)
Alert received from [employer]:  Scheduled remote reviewed. Normal device function.   Battery estimated 5.64mo- route to triage Next remote to be determined  Monthly remotes scheduled.   Pt advised.  All questions answered.

## 2022-08-01 DIAGNOSIS — N401 Enlarged prostate with lower urinary tract symptoms: Secondary | ICD-10-CM | POA: Diagnosis not present

## 2022-08-01 DIAGNOSIS — N281 Cyst of kidney, acquired: Secondary | ICD-10-CM | POA: Diagnosis not present

## 2022-08-01 DIAGNOSIS — N200 Calculus of kidney: Secondary | ICD-10-CM | POA: Diagnosis not present

## 2022-08-01 DIAGNOSIS — N3942 Incontinence without sensory awareness: Secondary | ICD-10-CM | POA: Diagnosis not present

## 2022-08-01 DIAGNOSIS — R310 Gross hematuria: Secondary | ICD-10-CM | POA: Diagnosis not present

## 2022-08-23 NOTE — Progress Notes (Signed)
Remote pacemaker transmission.   

## 2022-08-26 ENCOUNTER — Telehealth: Payer: Self-pay

## 2022-08-26 NOTE — Telephone Encounter (Signed)
Outreach made to Pt to assist with manual transmission.  Last remote transmission missed.  Pt on monthly battery checks.  Transmission sent.  Battery estimate 4.1 months.

## 2022-09-15 ENCOUNTER — Ambulatory Visit (INDEPENDENT_AMBULATORY_CARE_PROVIDER_SITE_OTHER): Payer: Medicare HMO

## 2022-09-15 DIAGNOSIS — I442 Atrioventricular block, complete: Secondary | ICD-10-CM

## 2022-09-15 LAB — CUP PACEART REMOTE DEVICE CHECK
Battery Remaining Longevity: 1 mo
Battery Remaining Percentage: 2 %
Battery Voltage: 2.66 V
Brady Statistic AP VP Percent: 45 %
Brady Statistic AP VS Percent: 1 %
Brady Statistic AS VP Percent: 55 %
Brady Statistic AS VS Percent: 1 %
Brady Statistic RA Percent Paced: 44 %
Brady Statistic RV Percent Paced: 99 %
Date Time Interrogation Session: 20240425044803
Implantable Lead Connection Status: 753985
Implantable Lead Connection Status: 753985
Implantable Lead Implant Date: 20000712
Implantable Lead Implant Date: 20000712
Implantable Lead Location: 753859
Implantable Lead Location: 753860
Implantable Lead Model: 4460
Implantable Lead Model: 4461
Implantable Lead Serial Number: 200213
Implantable Lead Serial Number: 20312
Implantable Pulse Generator Implant Date: 20140812
Lead Channel Impedance Value: 380 Ohm
Lead Channel Impedance Value: 390 Ohm
Lead Channel Pacing Threshold Amplitude: 0.75 V
Lead Channel Pacing Threshold Amplitude: 0.875 V
Lead Channel Pacing Threshold Pulse Width: 0.4 ms
Lead Channel Pacing Threshold Pulse Width: 0.4 ms
Lead Channel Sensing Intrinsic Amplitude: 12 mV
Lead Channel Sensing Intrinsic Amplitude: 2.8 mV
Lead Channel Setting Pacing Amplitude: 1.125
Lead Channel Setting Pacing Amplitude: 2 V
Lead Channel Setting Pacing Pulse Width: 0.4 ms
Lead Channel Setting Sensing Sensitivity: 4 mV
Pulse Gen Model: 2240
Pulse Gen Serial Number: 2974994

## 2022-09-30 DIAGNOSIS — H9193 Unspecified hearing loss, bilateral: Secondary | ICD-10-CM | POA: Diagnosis not present

## 2022-09-30 DIAGNOSIS — R4789 Other speech disturbances: Secondary | ICD-10-CM | POA: Diagnosis not present

## 2022-10-11 NOTE — Progress Notes (Signed)
Remote pacemaker transmission.   

## 2022-10-19 ENCOUNTER — Ambulatory Visit (INDEPENDENT_AMBULATORY_CARE_PROVIDER_SITE_OTHER): Payer: Medicare HMO

## 2022-10-19 DIAGNOSIS — M62838 Other muscle spasm: Secondary | ICD-10-CM | POA: Diagnosis not present

## 2022-10-19 DIAGNOSIS — M542 Cervicalgia: Secondary | ICD-10-CM | POA: Diagnosis not present

## 2022-10-19 DIAGNOSIS — I442 Atrioventricular block, complete: Secondary | ICD-10-CM | POA: Diagnosis not present

## 2022-10-20 LAB — CUP PACEART REMOTE DEVICE CHECK
Battery Remaining Longevity: 1 mo
Battery Remaining Percentage: 1 %
Battery Voltage: 2.65 V
Brady Statistic AP VP Percent: 45 %
Brady Statistic AP VS Percent: 1 %
Brady Statistic AS VP Percent: 55 %
Brady Statistic AS VS Percent: 1 %
Brady Statistic RA Percent Paced: 45 %
Brady Statistic RV Percent Paced: 99 %
Date Time Interrogation Session: 20240529020029
Implantable Lead Connection Status: 753985
Implantable Lead Connection Status: 753985
Implantable Lead Implant Date: 20000712
Implantable Lead Implant Date: 20000712
Implantable Lead Location: 753859
Implantable Lead Location: 753860
Implantable Lead Model: 4460
Implantable Lead Model: 4461
Implantable Lead Serial Number: 200213
Implantable Lead Serial Number: 20312
Implantable Pulse Generator Implant Date: 20140812
Lead Channel Impedance Value: 360 Ohm
Lead Channel Impedance Value: 390 Ohm
Lead Channel Pacing Threshold Amplitude: 0.75 V
Lead Channel Pacing Threshold Amplitude: 1 V
Lead Channel Pacing Threshold Pulse Width: 0.4 ms
Lead Channel Pacing Threshold Pulse Width: 0.4 ms
Lead Channel Sensing Intrinsic Amplitude: 12 mV
Lead Channel Sensing Intrinsic Amplitude: 3.8 mV
Lead Channel Setting Pacing Amplitude: 1.25 V
Lead Channel Setting Pacing Amplitude: 2 V
Lead Channel Setting Pacing Pulse Width: 0.4 ms
Lead Channel Setting Sensing Sensitivity: 4 mV
Pulse Gen Model: 2240
Pulse Gen Serial Number: 2974994

## 2022-11-10 NOTE — Progress Notes (Signed)
Remote pacemaker transmission.   

## 2022-11-16 ENCOUNTER — Ambulatory Visit: Payer: Medicare HMO

## 2022-11-16 DIAGNOSIS — I442 Atrioventricular block, complete: Secondary | ICD-10-CM

## 2022-11-22 LAB — CUP PACEART REMOTE DEVICE CHECK
Battery Remaining Longevity: 1 mo
Battery Remaining Percentage: 0.5 %
Battery Voltage: 2.6 V
Brady Statistic AP VP Percent: 45 %
Brady Statistic AP VS Percent: 1 %
Brady Statistic AS VP Percent: 55 %
Brady Statistic AS VS Percent: 1 %
Brady Statistic RA Percent Paced: 45 %
Brady Statistic RV Percent Paced: 99 %
Date Time Interrogation Session: 20240629020022
Implantable Lead Connection Status: 753985
Implantable Lead Connection Status: 753985
Implantable Lead Implant Date: 20000712
Implantable Lead Implant Date: 20000712
Implantable Lead Location: 753859
Implantable Lead Location: 753860
Implantable Lead Model: 4460
Implantable Lead Model: 4461
Implantable Lead Serial Number: 200213
Implantable Lead Serial Number: 20312
Implantable Pulse Generator Implant Date: 20140812
Lead Channel Impedance Value: 390 Ohm
Lead Channel Impedance Value: 400 Ohm
Lead Channel Pacing Threshold Amplitude: 0.75 V
Lead Channel Pacing Threshold Amplitude: 1 V
Lead Channel Pacing Threshold Pulse Width: 0.4 ms
Lead Channel Pacing Threshold Pulse Width: 0.4 ms
Lead Channel Sensing Intrinsic Amplitude: 12 mV
Lead Channel Sensing Intrinsic Amplitude: 4.2 mV
Lead Channel Setting Pacing Amplitude: 1.25 V
Lead Channel Setting Pacing Amplitude: 2 V
Lead Channel Setting Pacing Pulse Width: 0.4 ms
Lead Channel Setting Sensing Sensitivity: 4 mV
Pulse Gen Model: 2240
Pulse Gen Serial Number: 2974994

## 2022-11-28 ENCOUNTER — Encounter: Payer: Medicare HMO | Admitting: Physician Assistant

## 2022-12-01 NOTE — Progress Notes (Signed)
  Electrophysiology Office Note:   Date:  12/05/2022  ID:  Charles Olson, DOB 11-Oct-1929, MRN [id number]  Primary Cardiologist: Donato Schultz, MD Electrophysiologist: Lewayne Bunting, MD      History of Present Illness:   Charles Olson is a 89 y.o. male with h/o BPH, GERD, esophageal strictures, Stokes-Adams attacks / CHB s/p SJB dual chamber PPM seen today for routine electrophysiology followup.   Last remote device check performed 11/22/2022 per Dr. Ladona Ridgel showed appropriate histograms, stable battery & leads. He was last seen in EP Clinic 04/2022, at that time he was doing well and was ~8 months from ERI.   Since last being seen in our clinic the patient reports doing well. He states his blood pressure is always elevated in the office - "white coat" and is not at home when he checks it. He continues to play pickle ball and has been dubbed a Product manager legend".    He denies chest pain, palpitations, dyspnea, PND, orthopnea, nausea, vomiting, dizziness, syncope, edema, weight gain, or early satiety.   Review of systems complete and found to be negative unless listed in HPI.    EP information / Studies Reviewed:    EKG is not ordered today. EKG from 06/18/21 reviewed which showed AV Paced 55      PPM Interrogation-  reviewed in detail today,  See PACEART report.  Device History: Abbott Dual Chamber PPM implanted in 2000 for CHB Gen Change: 2005, 2014  Risk Assessment/Calculations:              Physical Exam:   VS:  BP 138/80   Pulse 68   Ht 5\' 10"  (1.778 m)   Wt 135 lb 9.6 oz (61.5 kg)   SpO2 96%   BMI 19.46 kg/m    Wt Readings from Last 3 Encounters:  12/05/22 135 lb 9.6 oz (61.5 kg)  05/02/22 138 lb (62.6 kg)  04/28/22 140 lb (63.5 kg)     GEN: Well nourished, well developed in no acute distress NECK: No JVD; No carotid bruits CARDIAC: Regular rate and rhythm, no murmurs, rubs, gallops RESPIRATORY:  Clear to auscultation without rales, wheezing or rhonchi  ABDOMEN:  Soft, non-tender, non-distended EXTREMITIES:  No edema; No deformity   ASSESSMENT AND PLAN:    CHB s/p Abbott PPM  Normal PPM function At [facility], plan for generator change on 7/22 per Dr. Ladona Ridgel BMP, CBC pre-procedure See Arita Miss Art report No changes today  HTN -reports not as elevated at home, some improvement in clinic but pt was excited talking about his generator change  HLD  -continue atorvastatin   Disposition:   Follow up with Dr. Ladona Ridgel  post gen change for wound check and in 91d   Signed, Canary Brim, MSN, APRN, NP-C, AGACNP-BC [facility] - Electrophysiology  12/05/2022, 9:40 AM

## 2022-12-05 ENCOUNTER — Ambulatory Visit: Payer: Medicare HMO | Attending: Physician Assistant | Admitting: Pulmonary Disease

## 2022-12-05 ENCOUNTER — Encounter: Payer: Self-pay | Admitting: *Deleted

## 2022-12-05 ENCOUNTER — Encounter: Payer: Self-pay | Admitting: Student

## 2022-12-05 VITALS — BP 138/80 | HR 68 | Ht 70.0 in | Wt 135.6 lb

## 2022-12-05 DIAGNOSIS — I442 Atrioventricular block, complete: Secondary | ICD-10-CM | POA: Diagnosis not present

## 2022-12-05 DIAGNOSIS — I10 Essential (primary) hypertension: Secondary | ICD-10-CM | POA: Diagnosis not present

## 2022-12-05 DIAGNOSIS — Z950 Presence of cardiac pacemaker: Secondary | ICD-10-CM

## 2022-12-05 LAB — CUP PACEART INCLINIC DEVICE CHECK
Battery Remaining Longevity: 0 mo
Battery Voltage: 2.6 V
Brady Statistic RA Percent Paced: 45 %
Brady Statistic RV Percent Paced: 99.69 %
Date Time Interrogation Session: 20240715101403
Implantable Lead Connection Status: 753985
Implantable Lead Connection Status: 753985
Implantable Lead Implant Date: 20000712
Implantable Lead Implant Date: 20000712
Implantable Lead Location: 753859
Implantable Lead Location: 753860
Implantable Lead Model: 4460
Implantable Lead Model: 4461
Implantable Lead Serial Number: 200213
Implantable Lead Serial Number: 20312
Implantable Pulse Generator Implant Date: 20140812
Lead Channel Impedance Value: 412.5 Ohm
Lead Channel Impedance Value: 450 Ohm
Lead Channel Pacing Threshold Amplitude: 0.75 V
Lead Channel Pacing Threshold Amplitude: 0.75 V
Lead Channel Pacing Threshold Amplitude: 0.875 V
Lead Channel Pacing Threshold Pulse Width: 0.4 ms
Lead Channel Pacing Threshold Pulse Width: 0.4 ms
Lead Channel Pacing Threshold Pulse Width: 0.4 ms
Lead Channel Sensing Intrinsic Amplitude: 12 mV
Lead Channel Sensing Intrinsic Amplitude: 5 mV
Lead Channel Setting Pacing Amplitude: 1.125
Lead Channel Setting Pacing Amplitude: 2 V
Lead Channel Setting Pacing Pulse Width: 0.4 ms
Lead Channel Setting Sensing Sensitivity: 4 mV
Pulse Gen Model: 2240
Pulse Gen Serial Number: 2974994

## 2022-12-05 NOTE — Patient Instructions (Addendum)
Medication Instructions:  Your physician recommends that you continue on your current medications as directed. Please refer to the Current Medication list given to you today.  *If you need a refill on your cardiac medications before your next appointment, please call your pharmacy*   Lab Work: BMET, CBC-TODAY If you have labs (blood work) drawn today and your tests are completely normal, you will receive your results only by: MyChart Message (if you have MyChart) OR A paper copy in the mail If you have any lab test that is abnormal or we need to change your treatment, we will call you to review the results.   Testing/Procedures: See instruction letters   Follow-Up: At [facility], you and your health needs are our priority.  As part of our continuing mission to provide you with exceptional heart care, we have created designated Provider Care Teams.  These Care Teams include your primary Cardiologist (physician) and Advanced Practice Providers (APPs -  Physician Assistants and Nurse Practitioners) who all work together to provide you with the care you need, when you need it.  Your next appointment:   Follow up appointments will be scheduled an print out on discharge summary from hospital after procedure

## 2022-12-06 LAB — BASIC METABOLIC PANEL
BUN/Creatinine Ratio: 13 (ref 10–24)
BUN: 17 mg/dL (ref 10–36)
CO2: 28 mmol/L (ref 20–29)
Calcium: 9.6 mg/dL (ref 8.6–10.2)
Chloride: 102 mmol/L (ref 96–106)
Creatinine, Ser: 1.34 mg/dL — ABNORMAL HIGH (ref 0.76–1.27)
Glucose: 102 mg/dL — ABNORMAL HIGH (ref 70–99)
Potassium: 3.7 mmol/L (ref 3.5–5.2)
Sodium: 141 mmol/L (ref 134–144)
eGFR: 50 mL/min/{1.73_m2} — ABNORMAL LOW (ref >59)

## 2022-12-06 LAB — CBC
Hematocrit: 40.2 % (ref 37.5–51.0)
Hemoglobin: 14 g/dL (ref 13.0–17.7)
MCH: 32.5 pg (ref 26.6–33.0)
MCHC: 34.8 g/dL (ref 31.5–35.7)
MCV: 93 fL (ref 79–97)
Platelets: 177 10*3/uL (ref 150–450)
RBC: 4.31 x10E6/uL (ref 4.14–5.80)
RDW: 12.7 % (ref 11.6–15.4)
WBC: 6.5 10*3/uL (ref 3.4–10.8)

## 2022-12-07 NOTE — Progress Notes (Signed)
 Remote pacemaker transmission.   

## 2022-12-07 NOTE — Addendum Note (Signed)
Addended by: Elease Etienne A on: 12/07/2022 09:49 AM   Modules accepted: Level of Service

## 2022-12-11 NOTE — Pre-Procedure Instructions (Signed)
Attempted to call patient regarding procedure instructions.  Left voice mail on the following items: °Arrival time 0930 °Nothing to eat or drink after midnight °No meds AM of procedure °Responsible person to drive you home and stay with you for 24 hrs °Wash with special soap night before and morning of procedure °

## 2022-12-12 ENCOUNTER — Other Ambulatory Visit: Payer: Self-pay

## 2022-12-12 ENCOUNTER — Ambulatory Visit (HOSPITAL_COMMUNITY)
Admission: RE | Admit: 2022-12-12 | Discharge: 2022-12-12 | Disposition: A | Discharge status: Home / Self Care | Payer: Medicare HMO | Attending: Internal Medicine | Admitting: Internal Medicine

## 2022-12-12 ENCOUNTER — Encounter (HOSPITAL_COMMUNITY)
Admission: RE | Disposition: A | Discharge status: Home / Self Care | Payer: Self-pay | Source: Home / Self Care | Attending: Internal Medicine

## 2022-12-12 DIAGNOSIS — Z79899 Other long term (current) drug therapy: Secondary | ICD-10-CM | POA: Diagnosis not present

## 2022-12-12 DIAGNOSIS — Z45010 Encounter for checking and testing of cardiac pacemaker pulse generator [battery]: Secondary | ICD-10-CM | POA: Diagnosis not present

## 2022-12-12 DIAGNOSIS — I442 Atrioventricular block, complete: Secondary | ICD-10-CM | POA: Diagnosis not present

## 2022-12-12 DIAGNOSIS — I10 Essential (primary) hypertension: Secondary | ICD-10-CM | POA: Insufficient documentation

## 2022-12-12 DIAGNOSIS — E785 Hyperlipidemia, unspecified: Secondary | ICD-10-CM | POA: Diagnosis not present

## 2022-12-12 HISTORY — PX: PPM GENERATOR CHANGEOUT: EP1233

## 2022-12-12 SURGERY — PPM GENERATOR CHANGEOUT

## 2022-12-12 MED ORDER — CHLORHEXIDINE GLUCONATE 4 % EX SOLN: 4.0000 | Freq: Once | CUTANEOUS | Status: DC

## 2022-12-12 MED ORDER — LIDOCAINE HCL (PF) 1 % IJ SOLN
INTRAMUSCULAR | Status: DC | PRN
  Administered 2022-12-12: 60 mL

## 2022-12-12 MED ORDER — SODIUM CHLORIDE 0.9 % IV SOLN: 80.0000 mg | INTRAVENOUS | Status: AC

## 2022-12-12 MED ORDER — CEFAZOLIN SODIUM-DEXTROSE 2-4 GM/100ML-% IV SOLN: 2.0000 g | INTRAVENOUS | Status: AC

## 2022-12-12 MED ORDER — POVIDONE-IODINE 10 % EX SWAB
2.0000 | Freq: Once | CUTANEOUS | Status: AC
  Administered 2022-12-12: 2 via TOPICAL

## 2022-12-12 MED ORDER — ONDANSETRON HCL 4 MG/2ML IJ SOLN: 4.0000 mg | Freq: Four times a day (QID) | INTRAMUSCULAR | Status: DC | PRN

## 2022-12-12 MED ORDER — SODIUM CHLORIDE 0.9 % IV SOLN
INTRAVENOUS | Status: AC
  Administered 2022-12-12: 80 mg
  Filled 2022-12-12: qty 2

## 2022-12-12 MED ORDER — CEFAZOLIN SODIUM-DEXTROSE 2-4 GM/100ML-% IV SOLN
INTRAVENOUS | Status: AC
  Administered 2022-12-12: 2 g via INTRAVENOUS
  Filled 2022-12-12: qty 100

## 2022-12-12 MED ORDER — LIDOCAINE HCL (PF) 1 % IJ SOLN
INTRAMUSCULAR | Status: AC
  Filled 2022-12-12: qty 60

## 2022-12-12 MED ORDER — SODIUM CHLORIDE 0.9 % IV SOLN: INTRAVENOUS | Status: DC

## 2022-12-12 MED ORDER — ACETAMINOPHEN 325 MG PO TABS: 325.0000 mg | ORAL_TABLET | ORAL | Status: DC | PRN

## 2022-12-12 SURGICAL SUPPLY — 6 items
CABLE SURGICAL S-101-97-12 (CABLE) ×1 IMPLANT
PACEMAKER ASSURITY DR-RF (Pacemaker) IMPLANT
PAD DEFIB RADIO PHYSIO CONN (PAD) ×1 IMPLANT
POUCH AIGIS-R ANTIBACT PPM (Mesh General) ×1 IMPLANT
POUCH AIGIS-R ANTIBACT PPM MED (Mesh General) IMPLANT
TRAY PACEMAKER INSERTION (PACKS) ×1 IMPLANT

## 2022-12-12 NOTE — H&P (Signed)
HPI Charles Olson is a 89 y.o. male here for the follow-up of complete heart block and hypertension and to establish with me, formerly a Dr. Fawn Kirk patient He is a pleasant 89 yo man with Stokes Adams attacks, s/p , Saint Jude dual-chamber pacemaker implanted in 2000 with generator change in 2014.  Former patient of Dr. Yevonne Pax.  He did recall previously that when he had his pacemaker changed he felt funny as the wires are being exchanged and his heart rate paused. He is about 8 months from ERI. Despite his advanced age he feels well. No chest pain or sob. He is still playing pickle ball.   He and his wife live at friend's home. Allergies       Allergies  Allergen Reactions   Aspirin Anaphylaxis and Other (See Comments)      Swells severely internally.   Erythromycin        Sneezing - Tolerates Azithromycin                Current Outpatient Medications  Medication Sig Dispense Refill   atorvastatin (LIPITOR) 40 MG tablet Take 1 tablet (40 mg total) by mouth daily. 90 tablet 3   Cholecalciferol (VITAMIN D) 1000 UNITS capsule Take 1,000 Units by mouth daily.        flunisolide (NASALIDE) 25 MCG/ACT (0.025%) SOLN as needed.       Lysine 500 MG TABS Take 500 mg by mouth daily.        Multiple Vitamin (MULTIVITAMIN) tablet Take 1 tablet by mouth daily. It's a chewable tablet       pantoprazole (PROTONIX) 40 MG tablet Take 40 mg by mouth daily.           No current facility-administered medications for this visit.              Past Medical History:  Diagnosis Date   AVB (atrioventricular block)      HIGH DEGREE; s/p PPM implantation   BPH (benign prostatic hypertrophy)     Chest pain     Esophageal stricture     GERD (gastroesophageal reflux disease)     History of orthostatic hypotension     Hyperlipidemia     Pacemaker     Sinusitis     Syncope and collapse            ROS:    All systems reviewed and negative except as noted in the HPI.          Past  Surgical History:  Procedure Laterality Date   CARDIAC CATHETERIZATION   06/2003    NORMAL CORONARIES   CHOLECYSTECTOMY       PACEMAKER GENERATOR CHANGE N/A 01/01/2013    Procedure: PACEMAKER GENERATOR CHANGE;  Surgeon: Hillis Range, MD;  Location: [facility] CATH LAB;  Service: Cardiovascular;  Laterality: N/A;   PACEMAKER INSERTION   11/1998; 12/2012    Medtronic [id number] implanted by Dr Reyes Ivan in 2008; generator change to STJ Assurity 12/2012 by Dr Johney Frame                 Family History  Problem Relation Age of Onset   Heart attack Brother     CVA Mother     Asthma Mother     Colon cancer Unknown          ?            Social History  Socioeconomic History   Marital status: Married      Spouse name: Not on file   Number of children: 3   Years of education: Not on file   Highest education level: Not on file  Occupational History   Occupation: Retired  Tobacco Use   Smoking status: Never   Smokeless tobacco: Never  Vaping Use   Vaping Use: Never used  Substance and Sexual Activity   Alcohol use: No   Drug use: No   Sexual activity: Not on file  Other Topics Concern   Not on file  Social History Narrative   Not on file    Social Determinants of Health    Financial Resource Strain: Not on file  Food Insecurity: Not on file  Transportation Needs: Not on file  Physical Activity: Not on file  Stress: Not on file  Social Connections: Not on file  Intimate Partner Violence: Not on file        BP (!) 150/70 (BP Location: Left Arm)   Pulse (!) 59   Wt 138 lb (62.6 kg)   SpO2 96%   BMI 19.80 kg/m    Physical Exam:   Well appearing elderly man, NAD HEENT: Unremarkable Neck:  No JVD, no thyromegally Lymphatics:  No adenopathy Back:  No CVA tenderness Lungs:  Clear HEART:  Regular rate rhythm, no murmurs, no rubs, no clicks Abd:  soft, positive bowel sounds, no organomegally, no rebound, no guarding Ext:  2 plus pulses, no edema, no cyanosis, no  clubbing Skin:  No rashes no nodules Neuro:  CN II through XII intact, motor grossly intact   DEVICE  Normal device function.  See PaceArt for details.    Assess/Plan: CHB - he is asymptomatic s/p PPM insertion. PPM - his St. Jude DDD PM is working normally. HTN - his bp is up a bit but better at home. Dyslipidemia - he will continue statin therapy.   Charles Olson  EP Attending  Since his last clinic visit he has reached ERI. I have discussed the indications/risks/benefits/goals/expectations of PPM gen change out and he wishes to proceed.  Sharlot Gowda Charles Galan,MD

## 2022-12-12 NOTE — Discharge Instructions (Signed)

## 2022-12-13 ENCOUNTER — Encounter (HOSPITAL_COMMUNITY): Payer: Self-pay | Admitting: Internal Medicine

## 2022-12-28 ENCOUNTER — Ambulatory Visit: Payer: Medicare HMO | Attending: Cardiology

## 2022-12-28 DIAGNOSIS — I442 Atrioventricular block, complete: Secondary | ICD-10-CM | POA: Diagnosis not present

## 2022-12-28 LAB — CUP PACEART INCLINIC DEVICE CHECK
Battery Remaining Longevity: 121 mo
Battery Voltage: 3.07 V
Brady Statistic RA Percent Paced: 52 %
Brady Statistic RV Percent Paced: 99.72 %
Date Time Interrogation Session: 20240807143157
Implantable Lead Connection Status: 753985
Implantable Lead Connection Status: 753985
Implantable Lead Implant Date: 20000712
Implantable Lead Implant Date: 20000712
Implantable Lead Location: 753859
Implantable Lead Location: 753860
Implantable Lead Model: 4460
Implantable Lead Model: 4461
Implantable Lead Serial Number: 200213
Implantable Lead Serial Number: 20312
Implantable Pulse Generator Implant Date: 20240722
Lead Channel Impedance Value: 387.5 Ohm
Lead Channel Impedance Value: 425 Ohm
Lead Channel Pacing Threshold Amplitude: 0.75 V
Lead Channel Pacing Threshold Amplitude: 0.75 V
Lead Channel Pacing Threshold Amplitude: 1 V
Lead Channel Pacing Threshold Amplitude: 1 V
Lead Channel Pacing Threshold Pulse Width: 0.5 ms
Lead Channel Pacing Threshold Pulse Width: 0.5 ms
Lead Channel Pacing Threshold Pulse Width: 0.5 ms
Lead Channel Pacing Threshold Pulse Width: 0.5 ms
Lead Channel Sensing Intrinsic Amplitude: 3.2 mV
Lead Channel Setting Pacing Amplitude: 1.375
Lead Channel Setting Pacing Amplitude: 2 V
Lead Channel Setting Pacing Pulse Width: 0.5 ms
Lead Channel Setting Sensing Sensitivity: 4 mV
Pulse Gen Model: 2272
Pulse Gen Serial Number: 5819053

## 2022-12-28 NOTE — Progress Notes (Signed)
Wound check appointment. Steri-strips removed. Wound without redness or edema. Incision edges approximated, wound well healed. Normal device function. Thresholds, sensing, and impedances consistent with implant measurements. Device programmed appropriately for chronic leads. Histogram distribution appropriate for patient and level of activity. No mode switches or high ventricular rates noted. Patient educated about wound care.  No arm mobility or lifting restrictions. ROV in 3 months with implanting physician.

## 2022-12-28 NOTE — Patient Instructions (Signed)

## 2023-01-18 ENCOUNTER — Ambulatory Visit: Payer: Medicare HMO

## 2023-02-28 ENCOUNTER — Ambulatory Visit: Payer: Medicare HMO | Attending: Internal Medicine

## 2023-02-28 ENCOUNTER — Telehealth: Payer: Self-pay

## 2023-02-28 DIAGNOSIS — I442 Atrioventricular block, complete: Secondary | ICD-10-CM

## 2023-02-28 NOTE — Telephone Encounter (Signed)
Pt called stating he believed he has a stitched he wanted someone to help him with. He could not hear me and hung up the phone. I tried to call him back but he did not answer.

## 2023-02-28 NOTE — Patient Instructions (Addendum)
Please refrain from putting anything around your pacemaker site. Keep area clean and dry. Call if you have increased redness, swelling or drainage.   Device Clinic [phone]

## 2023-02-28 NOTE — Progress Notes (Signed)
Patient seen in device clinic for erythema to skin at pacemaker site x1 week. Patient was concerned for stitch. No stitch present. Some yellow drainage was noted at incision area with small open area. Patient has been itching site. Dr. Nelly Laurence in to see patient who advised patient not to apply anything to area, clean with warm soapy water and call if increased redness, drainage or any swelling. Patient voiced understanding.

## 2023-03-07 DIAGNOSIS — E785 Hyperlipidemia, unspecified: Secondary | ICD-10-CM | POA: Diagnosis not present

## 2023-03-07 DIAGNOSIS — Z8249 Family history of ischemic heart disease and other diseases of the circulatory system: Secondary | ICD-10-CM | POA: Diagnosis not present

## 2023-03-07 DIAGNOSIS — I4430 Unspecified atrioventricular block: Secondary | ICD-10-CM | POA: Diagnosis not present

## 2023-03-07 DIAGNOSIS — G3184 Mild cognitive impairment, so stated: Secondary | ICD-10-CM | POA: Diagnosis not present

## 2023-03-07 DIAGNOSIS — Z809 Family history of malignant neoplasm, unspecified: Secondary | ICD-10-CM | POA: Diagnosis not present

## 2023-03-07 DIAGNOSIS — Z880 Allergy status to penicillin: Secondary | ICD-10-CM | POA: Diagnosis not present

## 2023-03-07 DIAGNOSIS — J309 Allergic rhinitis, unspecified: Secondary | ICD-10-CM | POA: Diagnosis not present

## 2023-03-07 DIAGNOSIS — Z823 Family history of stroke: Secondary | ICD-10-CM | POA: Diagnosis not present

## 2023-03-07 DIAGNOSIS — N1832 Chronic kidney disease, stage 3b: Secondary | ICD-10-CM | POA: Diagnosis not present

## 2023-03-07 DIAGNOSIS — K219 Gastro-esophageal reflux disease without esophagitis: Secondary | ICD-10-CM | POA: Diagnosis not present

## 2023-03-07 DIAGNOSIS — R32 Unspecified urinary incontinence: Secondary | ICD-10-CM | POA: Diagnosis not present

## 2023-03-07 DIAGNOSIS — K224 Dyskinesia of esophagus: Secondary | ICD-10-CM | POA: Diagnosis not present

## 2023-03-13 ENCOUNTER — Ambulatory Visit (INDEPENDENT_AMBULATORY_CARE_PROVIDER_SITE_OTHER): Payer: Medicare HMO

## 2023-03-13 DIAGNOSIS — I442 Atrioventricular block, complete: Secondary | ICD-10-CM | POA: Diagnosis not present

## 2023-03-14 LAB — CUP PACEART REMOTE DEVICE CHECK
Battery Remaining Longevity: 115 mo
Battery Remaining Percentage: 95.5 %
Battery Voltage: 3.02 V
Brady Statistic AP VP Percent: 45 %
Brady Statistic AP VS Percent: 1 %
Brady Statistic AS VP Percent: 55 %
Brady Statistic AS VS Percent: 1 %
Brady Statistic RA Percent Paced: 45 %
Brady Statistic RV Percent Paced: 99 %
Date Time Interrogation Session: 20241021020012
Implantable Lead Connection Status: 753985
Implantable Lead Connection Status: 753985
Implantable Lead Implant Date: 20000712
Implantable Lead Implant Date: 20000712
Implantable Lead Location: 753859
Implantable Lead Location: 753860
Implantable Lead Model: 4460
Implantable Lead Model: 4461
Implantable Lead Serial Number: 200213
Implantable Lead Serial Number: 20312
Implantable Pulse Generator Implant Date: 20240722
Lead Channel Impedance Value: 340 Ohm
Lead Channel Impedance Value: 410 Ohm
Lead Channel Pacing Threshold Amplitude: 0.75 V
Lead Channel Pacing Threshold Amplitude: 0.875 V
Lead Channel Pacing Threshold Pulse Width: 0.5 ms
Lead Channel Pacing Threshold Pulse Width: 0.5 ms
Lead Channel Sensing Intrinsic Amplitude: 2.1 mV
Lead Channel Setting Pacing Amplitude: 1.125
Lead Channel Setting Pacing Amplitude: 2 V
Lead Channel Setting Pacing Pulse Width: 0.5 ms
Lead Channel Setting Sensing Sensitivity: 4 mV
Pulse Gen Model: 2272
Pulse Gen Serial Number: 5819053

## 2023-03-23 ENCOUNTER — Encounter: Payer: Self-pay | Admitting: Internal Medicine

## 2023-03-23 ENCOUNTER — Ambulatory Visit: Payer: Medicare HMO | Attending: Internal Medicine | Admitting: Internal Medicine

## 2023-03-23 ENCOUNTER — Encounter: Payer: Medicare HMO | Admitting: Internal Medicine

## 2023-03-23 VITALS — BP 164/100 | HR 62 | Ht 70.0 in | Wt 133.4 lb

## 2023-03-23 DIAGNOSIS — I442 Atrioventricular block, complete: Secondary | ICD-10-CM | POA: Diagnosis not present

## 2023-03-23 NOTE — Progress Notes (Signed)
HPI Charles Olson is a 89 y.o. male here for the follow-up of complete heart block and hypertension s/p recent PM gen change out.He is a pleasant 89 yo man with Stokes Adams attacks, s/p , Saint Jude dual-chamber pacemaker implanted in 2000 with generator change in 2014 and then 2024.  Former patient of Dr. Yevonne Pax.  He did recall previously that when he had his pacemaker changed he felt funny as the wires are being exchanged and his heart rate paused. He is about 8 months from ERI. Despite his advanced age he feels well. No chest pain or sob.  Allergies  Allergen Reactions   Aspirin Anaphylaxis and Other (See Comments)    Swells severely internally.   Erythromycin     Sneezing - Tolerates Azithromycin     Current Outpatient Medications  Medication Sig Dispense Refill   atorvastatin (LIPITOR) 40 MG tablet Take 1 tablet (40 mg total) by mouth daily. 90 tablet 3   Cholecalciferol (VITAMIN D) 1000 UNITS capsule Take 1,000 Units by mouth daily.      flunisolide (NASALIDE) 25 MCG/ACT (0.025%) SOLN Place 1-2 sprays into the nose daily as needed (Congestion).     Lysine 500 MG TABS Take 500 mg by mouth daily.      Multiple Vitamin (MULTIVITAMIN) tablet Take 1 tablet by mouth daily. It's a chewable tablet     pantoprazole (PROTONIX) 40 MG tablet Take 40 mg by mouth daily.      No current facility-administered medications for this visit.     Past Medical History:  Diagnosis Date   AVB (atrioventricular block)    HIGH DEGREE; s/p PPM implantation   BPH (benign prostatic hypertrophy)    Chest pain    Esophageal stricture    GERD (gastroesophageal reflux disease)    History of orthostatic hypotension    Hyperlipidemia    Pacemaker    Sinusitis    Syncope and collapse     ROS:   All systems reviewed and negative except as noted in the HPI.   Past Surgical History:  Procedure Laterality Date   CARDIAC CATHETERIZATION  06/2003   NORMAL CORONARIES   CHOLECYSTECTOMY      PACEMAKER GENERATOR CHANGE N/A 01/01/2013   Procedure: PACEMAKER GENERATOR CHANGE;  Surgeon: Hillis Range, MD;  Location: [facility] CATH LAB;  Service: Cardiovascular;  Laterality: N/A;   PACEMAKER INSERTION  11/1998; 12/2012   Medtronic [id number] implanted by Dr Reyes Ivan in 2008; generator change to STJ Assurity 12/2012 by Dr Johney Frame   [facility] Forestine Chute N/A 12/12/2022   Procedure: PPM GENERATOR CHANGEOUT;  Surgeon: Marinus Maw, MD;  Location: [facility] INVASIVE CV LAB;  Service: Cardiovascular;  Laterality: N/A;     Family History  Problem Relation Age of Onset   Heart attack Brother    CVA Mother    Asthma Mother    Colon cancer Unknown        ?     Social History   Socioeconomic History   Marital status: Married    Spouse name: Not on file   Number of children: 3   Years of education: Not on file   Highest education level: Not on file  Occupational History   Occupation: Retired  Tobacco Use   Smoking status: Never   Smokeless tobacco: Never  Vaping Use   Vaping status: Never Used  Substance and Sexual Activity   Alcohol use: No   Drug use: No   Sexual activity: Not on file  Other Topics Concern   Not on file  Social History Narrative   Not on file   Social Determinants of Health   Financial Resource Strain: Not on file  Food Insecurity: Not on file  Transportation Needs: Not on file  Physical Activity: Not on file  Stress: Not on file  Social Connections: Not on file  Intimate Partner Violence: Not on file     BP (!) 164/100 (BP Location: Left Arm, Patient Position: Sitting, Cuff Size: Normal)   Pulse 62   Ht 5\' 10"  (1.778 m)   Wt 133 lb 6.4 oz (60.5 kg)   SpO2 98%   BMI 19.14 kg/m   Physical Exam:  Well appearing NAD HEENT: Unremarkable Neck:  No JVD, no thyromegally Lymphatics:  No adenopathy Back:  No CVA tenderness Lungs:  Clear HEART:  Regular rate rhythm, no murmurs, no rubs, no clicks Abd:  soft, positive bowel sounds, no organomegally, no rebound,  no guarding Ext:  2 plus pulses, no edema, no cyanosis, no clubbing Skin:  No rashes no nodules Neuro:  CN II through XII intact, motor grossly intact  DEVICE  Normal device function.  See PaceArt for details.   Assess/Plan: CHB - he is asymptomatic s/p PPM insertion. Continue. PPM - his Medtronic DDD PM is interrogated today and is working [employer].  Dyslipidemia - continue statin therapy.  Charles Gowda Isamu Trammel,MD

## 2023-03-23 NOTE — Patient Instructions (Addendum)

## 2023-03-28 DIAGNOSIS — Z23 Encounter for immunization: Secondary | ICD-10-CM | POA: Diagnosis not present

## 2023-03-29 NOTE — Progress Notes (Signed)
Remote pacemaker transmission.   

## 2023-04-24 ENCOUNTER — Ambulatory Visit: Payer: Medicare HMO | Attending: Cardiology | Admitting: Cardiology

## 2023-04-24 ENCOUNTER — Encounter: Payer: Self-pay | Admitting: Cardiology

## 2023-04-24 VITALS — BP 164/86 | HR 71 | Ht 69.5 in | Wt 132.0 lb

## 2023-04-24 DIAGNOSIS — E785 Hyperlipidemia, unspecified: Secondary | ICD-10-CM | POA: Diagnosis not present

## 2023-04-24 DIAGNOSIS — Z950 Presence of cardiac pacemaker: Secondary | ICD-10-CM

## 2023-04-24 DIAGNOSIS — I442 Atrioventricular block, complete: Secondary | ICD-10-CM

## 2023-04-24 NOTE — Progress Notes (Signed)
  Cardiology Office Note:  .   Date:  04/24/2023  ID:  Charles Olson, DOB Jun 17, 1929, MRN [id number] PCP: Garlan Fillers, MD  [facility] Providers Cardiologist:  Donato Schultz, MD Electrophysiologist:  Lewayne Bunting, MD     History of Present Illness: .   Charles Olson is a 89 y.o. male Discussed with the use of AI scribe software  History of Present Illness   The patient, a 89 year old with a history of complete heart block, hypertension, and hyperlipidemia, presents for a follow-up visit. He has a Product manager, which was last changed in 2024. The patient reports that the most recent pacemaker change was performed without general anesthesia, which was a new and uncomfortable experience for him. Despite this, he is doing well post-procedure and has not experienced any shortness of breath or chest pain.  The patient is compliant with his medication regimen, which includes atorvastatin 40 mg for hyperlipidemia. He reports no changes in his medications. He continues to live at a friend's home, where he has been for the past five years, and enjoys an active lifestyle, including playing pickleball.  The patient's family history is significant for longevity, with his mother living to 38 and a half years and two siblings living past 100. The patient expresses a positive outlook on his health and anticipates needing another pacemaker change in about ten years.          Studies Reviewed: .        Results LABS LDL: 71  Risk Assessment/Calculations:           Physical Exam:   VS:  BP (!) 164/86   Pulse 71   Ht 5' 9.5" (1.765 m)   Wt 132 lb (59.9 kg)   SpO2 96%   BMI 19.21 kg/m    Wt Readings from Last 3 Encounters:  04/24/23 132 lb (59.9 kg)  03/23/23 133 lb 6.4 oz (60.5 kg)  12/12/22 140 lb (63.5 kg)    GEN: Well nourished, well developed in no acute distress NECK: No JVD; No carotid bruits CARDIAC: RRR, no murmurs, no rubs, no  gallops RESPIRATORY:  Clear to auscultation without rales, wheezing or rhonchi  ABDOMEN: Soft, non-tender, non-distended EXTREMITIES:  No edema; No deformity   ASSESSMENT AND PLAN: .    Assessment and Plan    Complete Heart Block with Pacemaker N[age]-year-old with complete heart block, managed with a Saint Jude dual chamber pacemaker. Generator change in 2014 and most recent change in summer 2024. No symptoms of dyspnea or chest pain. Pacemaker functioning well. Awake during last change due to anesthesia risks. Anticipated pacemaker function for approximately ten years. - Continue current pacemaker management - Follow up with Dr. Ladona Ridgel as needed  Hypertension Long-standing hypertension, well-controlled on avg. No medication changes. PCP watching.  - Continue current antihypertensive regimen  Hyperlipidemia Managed with atorvastatin 40 mg daily. LDL controlled at 71. No side effects reported. - Continue atorvastatin 40 mg daily  General Health Maintenance Active, engages in physical activities such as pickleball. No new health maintenance issues. - Encourage continued physical activity  Follow-up - Schedule follow-up appointment in one year.              Signed, Donato Schultz, MD

## 2023-04-24 NOTE — Patient Instructions (Signed)

## 2023-05-06 ENCOUNTER — Other Ambulatory Visit: Payer: Self-pay | Admitting: Cardiology

## 2023-05-21 DIAGNOSIS — R0981 Nasal congestion: Secondary | ICD-10-CM | POA: Diagnosis not present

## 2023-05-21 DIAGNOSIS — R051 Acute cough: Secondary | ICD-10-CM | POA: Diagnosis not present

## 2023-06-12 ENCOUNTER — Ambulatory Visit (INDEPENDENT_AMBULATORY_CARE_PROVIDER_SITE_OTHER): Payer: Medicare HMO

## 2023-06-12 DIAGNOSIS — I442 Atrioventricular block, complete: Secondary | ICD-10-CM

## 2023-06-12 LAB — CUP PACEART REMOTE DEVICE CHECK
Battery Remaining Longevity: 113 mo
Battery Remaining Percentage: 95.5 %
Battery Voltage: 3.01 V
Brady Statistic AP VP Percent: 38 %
Brady Statistic AP VS Percent: 1 %
Brady Statistic AS VP Percent: 62 %
Brady Statistic AS VS Percent: 1 %
Brady Statistic RA Percent Paced: 38 %
Brady Statistic RV Percent Paced: 99 %
Date Time Interrogation Session: 20250120020013
Implantable Lead Connection Status: 753985
Implantable Lead Connection Status: 753985
Implantable Lead Implant Date: 20000712
Implantable Lead Implant Date: 20000712
Implantable Lead Location: 753859
Implantable Lead Location: 753860
Implantable Lead Model: 4460
Implantable Lead Model: 4461
Implantable Lead Serial Number: 200213
Implantable Lead Serial Number: 20312
Implantable Pulse Generator Implant Date: 20240722
Lead Channel Impedance Value: 360 Ohm
Lead Channel Impedance Value: 400 Ohm
Lead Channel Pacing Threshold Amplitude: 0.75 V
Lead Channel Pacing Threshold Amplitude: 0.75 V
Lead Channel Pacing Threshold Pulse Width: 0.5 ms
Lead Channel Pacing Threshold Pulse Width: 0.5 ms
Lead Channel Sensing Intrinsic Amplitude: 3.6 mV
Lead Channel Setting Pacing Amplitude: 1 V
Lead Channel Setting Pacing Amplitude: 2 V
Lead Channel Setting Pacing Pulse Width: 0.5 ms
Lead Channel Setting Sensing Sensitivity: 4 mV
Pulse Gen Model: 2272
Pulse Gen Serial Number: 5819053

## 2023-06-13 ENCOUNTER — Encounter: Payer: Self-pay | Admitting: Internal Medicine

## 2023-06-20 DIAGNOSIS — E785 Hyperlipidemia, unspecified: Secondary | ICD-10-CM | POA: Diagnosis not present

## 2023-06-20 DIAGNOSIS — R972 Elevated prostate specific antigen [PSA]: Secondary | ICD-10-CM | POA: Diagnosis not present

## 2023-06-20 DIAGNOSIS — I10 Essential (primary) hypertension: Secondary | ICD-10-CM | POA: Diagnosis not present

## 2023-06-27 DIAGNOSIS — D329 Benign neoplasm of meninges, unspecified: Secondary | ICD-10-CM | POA: Diagnosis not present

## 2023-06-27 DIAGNOSIS — H903 Sensorineural hearing loss, bilateral: Secondary | ICD-10-CM | POA: Diagnosis not present

## 2023-06-27 DIAGNOSIS — Z950 Presence of cardiac pacemaker: Secondary | ICD-10-CM | POA: Diagnosis not present

## 2023-06-27 DIAGNOSIS — R82998 Other abnormal findings in urine: Secondary | ICD-10-CM | POA: Diagnosis not present

## 2023-06-27 DIAGNOSIS — E785 Hyperlipidemia, unspecified: Secondary | ICD-10-CM | POA: Diagnosis not present

## 2023-06-27 DIAGNOSIS — Z0000 Encounter for general adult medical examination without abnormal findings: Secondary | ICD-10-CM | POA: Diagnosis not present

## 2023-06-27 DIAGNOSIS — I10 Essential (primary) hypertension: Secondary | ICD-10-CM | POA: Diagnosis not present

## 2023-06-27 DIAGNOSIS — R634 Abnormal weight loss: Secondary | ICD-10-CM | POA: Diagnosis not present

## 2023-06-27 DIAGNOSIS — R1314 Dysphagia, pharyngoesophageal phase: Secondary | ICD-10-CM | POA: Diagnosis not present

## 2023-07-06 DIAGNOSIS — N3000 Acute cystitis without hematuria: Secondary | ICD-10-CM | POA: Diagnosis not present

## 2023-07-18 DIAGNOSIS — H40013 Open angle with borderline findings, low risk, bilateral: Secondary | ICD-10-CM | POA: Diagnosis not present

## 2023-07-18 DIAGNOSIS — H52203 Unspecified astigmatism, bilateral: Secondary | ICD-10-CM | POA: Diagnosis not present

## 2023-07-18 DIAGNOSIS — H26493 Other secondary cataract, bilateral: Secondary | ICD-10-CM | POA: Diagnosis not present

## 2023-07-18 DIAGNOSIS — H43813 Vitreous degeneration, bilateral: Secondary | ICD-10-CM | POA: Diagnosis not present

## 2023-07-18 DIAGNOSIS — H5213 Myopia, bilateral: Secondary | ICD-10-CM | POA: Diagnosis not present

## 2023-07-18 DIAGNOSIS — D23111 Other benign neoplasm of skin of right upper eyelid, including canthus: Secondary | ICD-10-CM | POA: Diagnosis not present

## 2023-07-18 DIAGNOSIS — H04123 Dry eye syndrome of bilateral lacrimal glands: Secondary | ICD-10-CM | POA: Diagnosis not present

## 2023-07-18 DIAGNOSIS — H524 Presbyopia: Secondary | ICD-10-CM | POA: Diagnosis not present

## 2023-07-19 ENCOUNTER — Non-Acute Institutional Stay: Payer: Self-pay | Admitting: Internal Medicine

## 2023-07-19 VITALS — BP 138/72 | HR 96 | Temp 98.5°F | Resp 17 | Ht 69.5 in | Wt 130.0 lb

## 2023-07-19 DIAGNOSIS — R1312 Dysphagia, oropharyngeal phase: Secondary | ICD-10-CM | POA: Diagnosis not present

## 2023-07-19 DIAGNOSIS — E785 Hyperlipidemia, unspecified: Secondary | ICD-10-CM

## 2023-07-19 DIAGNOSIS — D329 Benign neoplasm of meninges, unspecified: Secondary | ICD-10-CM

## 2023-07-19 DIAGNOSIS — N3946 Mixed incontinence: Secondary | ICD-10-CM | POA: Diagnosis not present

## 2023-07-19 DIAGNOSIS — N200 Calculus of kidney: Secondary | ICD-10-CM | POA: Insufficient documentation

## 2023-07-19 DIAGNOSIS — Z950 Presence of cardiac pacemaker: Secondary | ICD-10-CM

## 2023-07-19 DIAGNOSIS — R634 Abnormal weight loss: Secondary | ICD-10-CM

## 2023-07-19 DIAGNOSIS — R300 Dysuria: Secondary | ICD-10-CM | POA: Diagnosis not present

## 2023-07-19 DIAGNOSIS — K219 Gastro-esophageal reflux disease without esophagitis: Secondary | ICD-10-CM

## 2023-07-19 DIAGNOSIS — D320 Benign neoplasm of cerebral meninges: Secondary | ICD-10-CM | POA: Insufficient documentation

## 2023-07-19 DIAGNOSIS — E782 Mixed hyperlipidemia: Secondary | ICD-10-CM | POA: Insufficient documentation

## 2023-07-19 DIAGNOSIS — K449 Diaphragmatic hernia without obstruction or gangrene: Secondary | ICD-10-CM | POA: Insufficient documentation

## 2023-07-19 DIAGNOSIS — R350 Frequency of micturition: Secondary | ICD-10-CM

## 2023-07-19 DIAGNOSIS — Z461 Encounter for fitting and adjustment of hearing aid: Secondary | ICD-10-CM | POA: Insufficient documentation

## 2023-07-19 DIAGNOSIS — R1310 Dysphagia, unspecified: Secondary | ICD-10-CM | POA: Insufficient documentation

## 2023-07-19 LAB — POCT URINALYSIS DIP (MANUAL ENTRY)
Glucose, UA: NEGATIVE mg/dL
Nitrite, UA: POSITIVE — AB
Spec Grav, UA: >=1.03 — AB (ref 1.010–1.025)
Urobilinogen, UA: 0.2 U/dL
pH, UA: 6.5 (ref 5.0–8.0)

## 2023-07-19 NOTE — Progress Notes (Signed)
 [facility]:  [facility] of Service:  Clinic (12)  Provider:   Code Status:  Goals of Care:     07/19/2023    8:10 AM  Advanced Directives  Does Patient Have a Medical Advance Directive? Yes  Type of Estate agent of [place];Living will;Out of facility DNR (pink MOST or yellow form)  Does patient want to make changes to medical advance directive? No - Patient declined  Copy of Healthcare Power of Attorney in Chart? No - copy requested     Chief Complaint  Patient presents with   New Patient (Initial Visit)    Patient is here to establish care. Patient states he might have a UTI   Immunizations    Patient is due for immunizations patient declined    HPI: Patient is a 89 y.o. male seen today for medical management of chronic diseases.    Discussed the use of AI scribe software for clinical note transcription with the patient, who gave verbal consent to proceed.  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  He reports a UTI about three weeks ago, which was treated with antibiotics. Keflex for 7 days The infection has improved, but he has experienced a loss of bladder control, which he describes as sudden and frustrating.  He also reports constipation, stating that he sometimes goes three to four days without a bowel movement.  He has been using Miralax intermittently for relief.  In addition to these issues, the patient has noticed unintentional weight loss over the past two years. He reports that he has lost about 15 pounds since moving to his current residence five years ago. He attributes this to a decreased appetite and feeling full after eating half a meal.  He also mentions difficulty swallowing, which has been an issue for about 40 years.  The patient is active and enjoys playing pickleball, although he has fallen during  play in the past.  He has a pacemaker and is 100% dependent on it. He also has a benign tumor in his head, which has been stable for 40 years.  He wears hearing aids and has vision issues that affect his driving, particularly at night.   Lives with his Wife in [facility] [place]     Past Medical History:  Diagnosis Date   AVB (atrioventricular block)    HIGH DEGREE; s/p PPM implantation   BPH (benign prostatic hypertrophy)    Chest pain    Esophageal stricture    GERD (gastroesophageal reflux disease)    History of orthostatic hypotension    Hyperlipidemia    Pacemaker    Sinusitis    Syncope and collapse     Past Surgical History:  Procedure Laterality Date   CARDIAC CATHETERIZATION  06/2003   NORMAL CORONARIES   CHOLECYSTECTOMY     PACEMAKER GENERATOR CHANGE N/A 01/01/2013   Procedure: PACEMAKER GENERATOR CHANGE;  Surgeon: Hillis Range, MD;  Location: [facility] CATH LAB;  Service: Cardiovascular;  Laterality: N/A;   PACEMAKER INSERTION  11/1998; 12/2012   Medtronic [id number] implanted by Dr Reyes Ivan in 2008; generator change to STJ Assurity 12/2012 by Dr Johney Frame   [facility] Forestine Chute N/A 12/12/2022   Procedure: PPM GENERATOR CHANGEOUT;  Surgeon: Marinus Maw, MD;  Location: [facility] INVASIVE CV LAB;  Service: Cardiovascular;  Laterality: N/A;    Allergies  Allergen Reactions   Aspirin Anaphylaxis  and Other (See Comments)    Swells severely internally.   Erythromycin     Sneezing - Tolerates Azithromycin    Outpatient Encounter Medications as of 07/19/2023  Medication Sig   atorvastatin (LIPITOR) 40 MG tablet Take 1 tablet by mouth once daily   Cholecalciferol (VITAMIN D) 1000 UNITS capsule Take 1,000 Units by mouth daily.    flunisolide (NASALIDE) 25 MCG/ACT (0.025%) SOLN Place 1-2 sprays into the nose daily as needed (Congestion).   Lysine 500 MG TABS Take 500 mg by mouth daily.    Multiple Vitamin (MULTIVITAMIN) tablet Take 1 tablet by mouth daily. It's a chewable tablet   pantoprazole  (PROTONIX) 40 MG tablet Take 40 mg by mouth daily.    No facility-administered encounter medications on file as of 07/19/2023.    Review of Systems:  Review of Systems  Constitutional:  Negative for activity change, appetite change and unexpected weight change.  HENT: Negative.    Respiratory:  Negative for cough and shortness of breath.   Cardiovascular:  Negative for leg swelling.  Gastrointestinal:  Positive for constipation.  Genitourinary:  Positive for frequency and urgency.  Musculoskeletal:  Negative for arthralgias, gait problem and myalgias.  Skin: Negative.  Negative for rash.  Neurological:  Negative for dizziness and weakness.  Psychiatric/Behavioral:  Negative for confusion and sleep disturbance.   All other systems reviewed and are negative.   Health Maintenance  Topic Date Due   Pneumonia Vaccine 49+ Years old (1 of 1 - PCV) 04/26/1997   COVID-19 Vaccine (4 - 2024-25 season) 01/22/2023   DTaP/Tdap/Td (3 - Tdap) 06/28/2023   Medicare Annual Wellness (AWV)  08/16/2023 (Originally 06/17/2023)   INFLUENZA VACCINE  Completed   Zoster Vaccines- Shingrix  Completed   HPV VACCINES  Aged Out    Physical Exam: Vitals:   07/19/23 0806  BP: 138/72  Pulse: 96  Resp: 17  Temp: 98.5 F (36.9 C)  TempSrc: Temporal  SpO2: 95%  Weight: 130 lb (59 kg)  Height: 5' 9.5" (1.765 m)   Body mass index is 18.92 kg/m. Physical Exam Vitals reviewed.  Constitutional:      Appearance: Normal appearance.  HENT:     Head: Normocephalic.     Nose: Nose normal.     Mouth/Throat:     Mouth: Mucous membranes are moist.     Pharynx: Oropharynx is clear.  Eyes:     Pupils: Pupils are equal, round, and reactive to light.  Cardiovascular:     Rate and Rhythm: Normal rate and regular rhythm.     Pulses: Normal pulses.     Heart sounds: No murmur heard. Pulmonary:     Effort: Pulmonary effort is normal. No respiratory distress.     Breath sounds: Normal breath sounds. No rales.   Abdominal:     General: Abdomen is flat. Bowel sounds are normal.     Palpations: Abdomen is soft.  Musculoskeletal:        General: No swelling.     Cervical back: Neck supple.  Skin:    General: Skin is warm.  Neurological:     General: No focal deficit present.     Mental Status: He is alert and oriented to person, place, and time.  Psychiatric:        Mood and Affect: Mood normal.        Thought Content: Thought content normal.     Labs reviewed: Basic Metabolic Panel: Recent Labs    12/05/22 0959  NA 141  K 3.7  CL 102  CO2 28  GLUCOSE 102*  BUN 17  CREATININE 1.34*  CALCIUM 9.6   Liver Function Tests: No results for input(s): "AST", "ALT", "ALKPHOS", "BILITOT", "PROT", "ALBUMIN" in the last 8760 hours. No results for input(s): "LIPASE", "AMYLASE" in the last 8760 hours. No results for input(s): "AMMONIA" in the last 8760 hours. CBC: Recent Labs    12/05/22 0959  WBC 6.5  HGB 14.0  HCT 40.2  MCV 93  PLT 177   Lipid Panel: No results for input(s): "CHOL", "HDL", "LDLCALC", "TRIG", "CHOLHDL", "LDLDIRECT" in the last 8760 hours. No results found for: "HGBA1C"  Procedures since last visit: No results found.  Assessment/Plan 1. Mixed stress and urge urinary incontinence (Primary) UA tested here shows Leucocytes and Hematuria Patient is Dr Annabell Howells patient but not able to see him  I will make Urgent referal to him through Epic to expedite the Process - Ambulatory referral to Urology  2. PACEMAKER, PERMANENT Has CHB PPM dependent Follows with Cardiology  3. Oropharyngeal dysphagia Has seen GI before many years ago Has to some times crush Meds to swallow On Protonix   4. Dyslipidemia On statin - Lipid panel - COMPLETE METABOLIC PANEL WITH GFR - CBC with Differential/Platelet  5. Gastroesophageal reflux disease without esophagitis Protonix  6. Benign neoplasm of meninges (HCC) Has h/o Meningioma Un changed in Many years per Patient   7  Constipation Infrequent bowel movements, hard stools. Intermittent use of Miralax with good response. -Advise regular use of Miralax every other day to maintain regular bowel movements.  8 Unintentional Weight Loss Lost 15 pounds over the past 5 years, weight stable for the past 6 months. No current gastrointestinal symptoms. -Plan to monitor weight closely. If continued weight loss, consider further evaluation with abdominal CT scan. -Schedule follow-up appointment in 3 months with blood work 1 week prior to appointment.  General Health Maintenance -Continue regular follow-up with Cardiologist (Dr. Ladona Ridgel) for pacemaker management. -Continue regular follow-up with Ophthalmologist for vision issues. -Continue regular follow-up with Dentist for oral health. -Continue regular follow-up with Urologist (Dr. Chauncy Lean) for prostate issues.        Labs/tests ordered:  * No order type specified * Next appt:  Visit date not found

## 2023-07-20 DIAGNOSIS — N453 Epididymo-orchitis: Secondary | ICD-10-CM | POA: Diagnosis not present

## 2023-07-20 DIAGNOSIS — R350 Frequency of micturition: Secondary | ICD-10-CM | POA: Diagnosis not present

## 2023-07-20 DIAGNOSIS — R300 Dysuria: Secondary | ICD-10-CM | POA: Diagnosis not present

## 2023-07-20 NOTE — Progress Notes (Signed)
 Remote pacemaker transmission.

## 2023-07-21 LAB — URINALYSIS
Bilirubin Urine: NEGATIVE
Glucose, UA: NEGATIVE
Ketones, ur: NEGATIVE
Nitrite: NEGATIVE
Specific Gravity, Urine: 1.016 (ref 1.001–1.035)
pH: 5.5 (ref 5.0–8.0)

## 2023-07-23 LAB — URINE CULTURE
MICRO NUMBER:: 16137576
SPECIMEN QUALITY:: ADEQUATE

## 2023-07-23 LAB — EXTRA URINE SPECIMEN

## 2023-07-25 DIAGNOSIS — H26492 Other secondary cataract, left eye: Secondary | ICD-10-CM | POA: Diagnosis not present

## 2023-07-26 ENCOUNTER — Other Ambulatory Visit: Payer: Self-pay | Admitting: Internal Medicine

## 2023-07-26 DIAGNOSIS — H9190 Unspecified hearing loss, unspecified ear: Secondary | ICD-10-CM

## 2023-08-04 DIAGNOSIS — R8271 Bacteriuria: Secondary | ICD-10-CM | POA: Diagnosis not present

## 2023-08-04 DIAGNOSIS — N453 Epididymo-orchitis: Secondary | ICD-10-CM | POA: Diagnosis not present

## 2023-09-06 DIAGNOSIS — N200 Calculus of kidney: Secondary | ICD-10-CM | POA: Diagnosis not present

## 2023-09-06 DIAGNOSIS — R3121 Asymptomatic microscopic hematuria: Secondary | ICD-10-CM | POA: Diagnosis not present

## 2023-09-06 DIAGNOSIS — N401 Enlarged prostate with lower urinary tract symptoms: Secondary | ICD-10-CM | POA: Diagnosis not present

## 2023-09-06 DIAGNOSIS — N3942 Incontinence without sensory awareness: Secondary | ICD-10-CM | POA: Diagnosis not present

## 2023-09-06 DIAGNOSIS — N453 Epididymo-orchitis: Secondary | ICD-10-CM | POA: Diagnosis not present

## 2023-09-08 DIAGNOSIS — Z008 Encounter for other general examination: Secondary | ICD-10-CM | POA: Diagnosis not present

## 2023-09-11 ENCOUNTER — Ambulatory Visit (INDEPENDENT_AMBULATORY_CARE_PROVIDER_SITE_OTHER): Payer: Medicare HMO

## 2023-09-11 DIAGNOSIS — I442 Atrioventricular block, complete: Secondary | ICD-10-CM

## 2023-09-12 LAB — CUP PACEART REMOTE DEVICE CHECK
Battery Remaining Longevity: 110 mo
Battery Remaining Percentage: 95.5 %
Battery Voltage: 3.01 V
Brady Statistic AP VP Percent: 34 %
Brady Statistic AP VS Percent: 1 %
Brady Statistic AS VP Percent: 66 %
Brady Statistic AS VS Percent: 1 %
Brady Statistic RA Percent Paced: 33 %
Brady Statistic RV Percent Paced: 99 %
Date Time Interrogation Session: 20250421020012
Implantable Lead Connection Status: 753985
Implantable Lead Connection Status: 753985
Implantable Lead Implant Date: 20000712
Implantable Lead Implant Date: 20000712
Implantable Lead Location: 753859
Implantable Lead Location: 753860
Implantable Lead Model: 4460
Implantable Lead Model: 4461
Implantable Lead Serial Number: 200213
Implantable Lead Serial Number: 20312
Implantable Pulse Generator Implant Date: 20240722
Lead Channel Impedance Value: 360 Ohm
Lead Channel Impedance Value: 430 Ohm
Lead Channel Pacing Threshold Amplitude: 0.75 V
Lead Channel Pacing Threshold Amplitude: 0.875 V
Lead Channel Pacing Threshold Pulse Width: 0.5 ms
Lead Channel Pacing Threshold Pulse Width: 0.5 ms
Lead Channel Sensing Intrinsic Amplitude: 3.4 mV
Lead Channel Setting Pacing Amplitude: 1.125
Lead Channel Setting Pacing Amplitude: 2 V
Lead Channel Setting Pacing Pulse Width: 0.5 ms
Lead Channel Setting Sensing Sensitivity: 4 mV
Pulse Gen Model: 2272
Pulse Gen Serial Number: 5819053

## 2023-09-13 ENCOUNTER — Encounter: Payer: Self-pay | Admitting: Internal Medicine

## 2023-09-14 ENCOUNTER — Other Ambulatory Visit: Payer: Self-pay

## 2023-09-20 ENCOUNTER — Encounter: Payer: Self-pay | Admitting: Internal Medicine

## 2023-09-20 ENCOUNTER — Ambulatory Visit: Admitting: Internal Medicine

## 2023-09-20 VITALS — BP 172/80 | HR 60 | Temp 97.4°F | Resp 18 | Ht 69.5 in | Wt 131.1 lb

## 2023-09-20 DIAGNOSIS — I10 Essential (primary) hypertension: Secondary | ICD-10-CM

## 2023-09-20 DIAGNOSIS — R1312 Dysphagia, oropharyngeal phase: Secondary | ICD-10-CM

## 2023-09-20 DIAGNOSIS — R634 Abnormal weight loss: Secondary | ICD-10-CM

## 2023-09-20 DIAGNOSIS — H9190 Unspecified hearing loss, unspecified ear: Secondary | ICD-10-CM

## 2023-09-20 DIAGNOSIS — K219 Gastro-esophageal reflux disease without esophagitis: Secondary | ICD-10-CM | POA: Diagnosis not present

## 2023-09-20 DIAGNOSIS — N3946 Mixed incontinence: Secondary | ICD-10-CM

## 2023-09-20 DIAGNOSIS — E785 Hyperlipidemia, unspecified: Secondary | ICD-10-CM

## 2023-09-20 DIAGNOSIS — D329 Benign neoplasm of meninges, unspecified: Secondary | ICD-10-CM

## 2023-09-20 MED ORDER — AMLODIPINE BESYLATE 2.5 MG PO TABS: 2.5000 mg | ORAL_TABLET | Freq: Every day | ORAL | 1 refills | Status: DC

## 2023-09-20 NOTE — Patient Instructions (Addendum)
 Lab will be done on June 9th at [facility] at 7:45 am

## 2023-09-20 NOTE — Progress Notes (Signed)
 Location:  [facility]   Place of Service:   Clinic  Provider:   Code Status:  Goals of Care:     09/20/2023   10:08 AM  Advanced Directives  Does Patient Have a Medical Advance Directive? No  Would patient like information on creating a medical advance directive? No - Patient declined     Chief Complaint  Patient presents with   Hypertension    discuss blood pressure    HPI: Patient is a 89 y.o. male seen today for an acute visit for BP running high  Hypertension  Was checked by Raina Bunting Nurse was 160/90 And since then he has been monitoring it himself and it has been consistently been higher then 150/90 He denies any headaches,Vision Issues No falls Does feel little Dizzy sometime Nurse also did Orthostatics and he was negative  Mixed Incontinence Is going to see Dr Inga Manges for follow up Wears Pads at night  S/p PPM  for CHB  Oropharyngeal Dysphagia  HLD and Chronic GERD    Past Medical History:  Diagnosis Date   AVB (atrioventricular block)    HIGH DEGREE; s/p PPM implantation   BPH (benign prostatic hypertrophy)    Chest pain    Esophageal stricture    GERD (gastroesophageal reflux disease)    History of orthostatic hypotension    Hyperlipidemia    Pacemaker    Sinusitis    Syncope and collapse     Past Surgical History:  Procedure Laterality Date   CARDIAC CATHETERIZATION  06/2003   NORMAL CORONARIES   CHOLECYSTECTOMY     PACEMAKER GENERATOR CHANGE N/A 01/01/2013   Procedure: PACEMAKER GENERATOR CHANGE;  Surgeon: Jolly Needle, MD;  Location: [facility] CATH LAB;  Service: Cardiovascular;  Laterality: N/A;   PACEMAKER INSERTION  11/1998; 12/2012   Medtronic [id number] implanted by Dr Ardyce Bee in 2008; generator change to STJ Assurity 12/2012 by Dr Nunzio Belch   [facility] [place] Bramble N/A 12/12/2022   Procedure: PPM GENERATOR CHANGEOUT;  Surgeon: Tammie Fall, MD;  Location: [facility] INVASIVE CV LAB;  Service: Cardiovascular;  Laterality: N/A;    Allergies  Allergen Reactions    Aspirin Anaphylaxis and Other (See Comments)    Swells severely internally.   Erythromycin     Sneezing - Tolerates Azithromycin    Outpatient Encounter Medications as of 09/20/2023  Medication Sig   amLODipine (NORVASC) 2.5 MG tablet Take 1 tablet (2.5 mg total) by mouth daily.   atorvastatin  (LIPITOR) 40 MG tablet Take 1 tablet by mouth once daily   Cholecalciferol (VITAMIN D) 1000 UNITS capsule Take 1,000 Units by mouth daily.    flunisolide (NASALIDE) 25 MCG/ACT (0.025%) SOLN Place 1-2 sprays into the nose daily as needed (Congestion).   Lysine 500 MG TABS Take 500 mg by mouth daily.    Multiple Vitamin (MULTIVITAMIN) tablet Take 1 tablet by mouth daily. It's a chewable tablet   pantoprazole (PROTONIX) 40 MG tablet Take 40 mg by mouth daily.    No facility-administered encounter medications on file as of 09/20/2023.    Review of Systems:  Review of Systems  Constitutional:  Negative for activity change, appetite change and unexpected weight change.  HENT: Negative.    Respiratory:  Negative for cough and shortness of breath.   Cardiovascular:  Negative for leg swelling.  Gastrointestinal:  Negative for constipation.  Genitourinary:  Positive for frequency and urgency.  Musculoskeletal:  Negative for arthralgias, gait problem and myalgias.  Skin: Negative.  Negative for rash.  Neurological:  Negative  for dizziness and weakness.  Psychiatric/Behavioral:  Negative for confusion and sleep disturbance.   All other systems reviewed and are negative.   Health Maintenance  Topic Date Due   COVID-19 Vaccine (4 - 2024-25 season) 01/22/2023   DTaP/Tdap/Td (6 - Td or Tdap) 06/28/2023   INFLUENZA VACCINE  12/22/2023   Medicare Annual Wellness (AWV)  06/26/2024   Pneumonia Vaccine 86+ Years old  Completed   Zoster Vaccines- Shingrix  Completed   HPV VACCINES  Aged Out   Meningococcal B Vaccine  Aged Out    Physical Exam: Vitals:   09/20/23 0949 09/20/23 1005  BP: (!) 162/79  (!) 172/80  Pulse: 60   Resp: 18   Temp: (!) 97.4 F (36.3 C)   TempSrc: Temporal   SpO2: 98%   Weight: 131 lb 1.6 oz (59.5 kg)   Height: 5' 9.5" (1.765 m)    Body mass index is 19.08 kg/m. Physical Exam Vitals reviewed.  Constitutional:      Appearance: Normal appearance.  HENT:     Head: Normocephalic.     Nose: Nose normal.     Mouth/Throat:     Mouth: Mucous membranes are moist.     Pharynx: Oropharynx is clear.  Eyes:     Pupils: Pupils are equal, round, and reactive to light.  Cardiovascular:     Rate and Rhythm: Normal rate and regular rhythm.     Pulses: Normal pulses.     Heart sounds: No murmur heard. Pulmonary:     Effort: Pulmonary effort is normal. No respiratory distress.     Breath sounds: Normal breath sounds. No rales.  Abdominal:     General: Abdomen is flat. Bowel sounds are normal.     Palpations: Abdomen is soft.  Musculoskeletal:        General: No swelling.     Cervical back: Neck supple.  Skin:    General: Skin is warm.  Neurological:     General: No focal deficit present.     Mental Status: He is alert and oriented to person, place, and time.  Psychiatric:        Mood and Affect: Mood normal.        Thought Content: Thought content normal.     Labs reviewed: Basic Metabolic Panel: Recent Labs    12/05/22 0959  NA 141  K 3.7  CL 102  CO2 28  GLUCOSE 102*  BUN 17  CREATININE 1.34*  CALCIUM  9.6   Liver Function Tests: No results for input(s): "AST", "ALT", "ALKPHOS", "BILITOT", "PROT", "ALBUMIN" in the last 8760 hours. No results for input(s): "LIPASE", "AMYLASE" in the last 8760 hours. No results for input(s): "AMMONIA" in the last 8760 hours. CBC: Recent Labs    12/05/22 0959  WBC 6.5  HGB 14.0  HCT 40.2  MCV 93  PLT 177   Lipid Panel: No results for input(s): "CHOL", "HDL", "LDLCALC", "TRIG", "CHOLHDL", "LDLDIRECT" in the last 8760 hours. No results found for: "HGBA1C"  Procedures since last visit: CUP PACEART  REMOTE DEVICE CHECK Result Date: 09/12/2023 PPM Scheduled remote reviewed. Normal device function.  Presenting rhythm: AS/VP 1 AMS, 16sec in duration, PAT Next remote 91 days. LA, CVRS   Assessment/Plan 1. Essential hypertension, benign (Primary) Amlodipine  Follow up in 6 weeks  2. Mixed stress and urge urinary incontinence Plan to follow with Dr Inga Manges in few months  3. Oropharyngeal dysphagia Can crush his Meds Has seen GI before many years ago  4. Dyslipidemia Need  Repeat Labs  5. Gastroesophageal reflux disease without esophagitis Protonix  6. Benign neoplasm of meninges (HCC) Has h/o Meningioma Un changed in Many years per Patient   7. Weight loss Will Continue to monitor Has Added Ensure also now  8. HOH (hard of hearing) Plan to get New Hearing Aids    Labs/tests ordered:  * No order type specified * Next appt:  Visit date not found

## 2023-09-21 ENCOUNTER — Telehealth: Payer: Self-pay

## 2023-09-21 NOTE — Telephone Encounter (Signed)
 Copied from CRM [id number]. Topic: Clinical - Lab/Test Results >> Sep 21, 2023 12:44 PM Tiffany H wrote: Reason for CRM: Isa Manuel w/ [facility] called to advise that an in-home assessment for PAD was completed on [employer]'s behalf on 09/08/23. PAD results: Abnormal. Measurements: left side 1.31, right side 0.89.   Reaching out to let care team know that a follow up appointment is needed copy has been mailed. patient has been encouraged to continue with regular care  Phone: [phone] 8AM - 5PM - [facility] - Monday-Friday

## 2023-09-21 NOTE — Telephone Encounter (Signed)
 Message routed to PCP Marguerite Shiley, MD. Patient called and he states that he had appointment yesterday and follow up will be in June so he doesn't need anything sooner.

## 2023-09-21 NOTE — Telephone Encounter (Signed)
 That is fine. I will see him then

## 2023-09-22 NOTE — Progress Notes (Signed)
 A user error has taken place.

## 2023-10-30 DIAGNOSIS — R1312 Dysphagia, oropharyngeal phase: Secondary | ICD-10-CM | POA: Diagnosis not present

## 2023-10-30 DIAGNOSIS — E785 Hyperlipidemia, unspecified: Secondary | ICD-10-CM | POA: Diagnosis not present

## 2023-10-30 DIAGNOSIS — N3946 Mixed incontinence: Secondary | ICD-10-CM | POA: Diagnosis not present

## 2023-10-30 NOTE — Addendum Note (Signed)
 Addended by: Edra Govern D on: 10/30/2023 02:45 PM   Modules accepted: Orders

## 2023-10-30 NOTE — Progress Notes (Signed)
 Remote pacemaker transmission.

## 2023-10-31 LAB — COMPREHENSIVE METABOLIC PANEL WITH GFR
AG Ratio: 1.7 (calc) (ref 1.0–2.5)
ALT: 16 U/L (ref 9–46)
AST: 25 U/L (ref 10–35)
Albumin: 4 g/dL (ref 3.6–5.1)
Alkaline phosphatase (APISO): 85 U/L (ref 35–144)
BUN: 19 mg/dL (ref 7–25)
CO2: 27 mmol/L (ref 20–32)
Calcium: 9.2 mg/dL (ref 8.6–10.3)
Chloride: 106 mmol/L (ref 98–110)
Creat: 1.14 mg/dL (ref 0.70–1.22)
Globulin: 2.4 g/dL (ref 1.9–3.7)
Glucose, Bld: 86 mg/dL (ref 65–99)
Potassium: 4 mmol/L (ref 3.5–5.3)
Sodium: 142 mmol/L (ref 135–146)
Total Bilirubin: 0.8 mg/dL (ref 0.2–1.2)
Total Protein: 6.4 g/dL (ref 6.1–8.1)
eGFR: 61 mL/min/{1.73_m2} (ref >=60)

## 2023-10-31 LAB — LIPID PANEL
Cholesterol: 131 mg/dL (ref <200)
HDL: 53 mg/dL (ref >=40)
LDL Cholesterol (Calc): 62 mg/dL
Non-HDL Cholesterol (Calc): 78 mg/dL (ref <130)
Total CHOL/HDL Ratio: 2.5 (calc) (ref <5.0)
Triglycerides: 84 mg/dL (ref <150)

## 2023-10-31 LAB — CBC WITH DIFFERENTIAL/PLATELET
Absolute Lymphocytes: 2078 {cells}/uL (ref 850–3900)
Absolute Monocytes: 422 {cells}/uL (ref 200–950)
Basophils Absolute: 29 {cells}/uL (ref 0–200)
Basophils Relative: 0.6 %
Eosinophils Absolute: 322 {cells}/uL (ref 15–500)
Eosinophils Relative: 6.7 %
HCT: 43.4 % (ref 38.5–50.0)
Hemoglobin: 14.3 g/dL (ref 13.2–17.1)
MCH: 31.2 pg (ref 27.0–33.0)
MCHC: 32.9 g/dL (ref 32.0–36.0)
MCV: 94.6 fL (ref 80.0–100.0)
MPV: 10.2 fL (ref 7.5–12.5)
Monocytes Relative: 8.8 %
Neutro Abs: 1949 {cells}/uL (ref 1500–7800)
Neutrophils Relative %: 40.6 %
Platelets: 145 10*3/uL (ref 140–400)
RBC: 4.59 10*6/uL (ref 4.20–5.80)
RDW: 13.5 % (ref 11.0–15.0)
Total Lymphocyte: 43.3 %
WBC: 4.8 10*3/uL (ref 3.8–10.8)

## 2023-10-31 LAB — TSH: TSH: 1.28 m[IU]/L (ref 0.40–4.50)

## 2023-10-31 NOTE — Patient Instructions (Signed)
 As reminder to get you Tdap vaccine at your local pharmacy.

## 2023-11-01 ENCOUNTER — Ambulatory Visit: Admitting: Internal Medicine

## 2023-11-01 ENCOUNTER — Encounter: Payer: Self-pay | Admitting: Internal Medicine

## 2023-11-01 VITALS — BP 150/70 | HR 57 | Temp 97.3°F | Resp 18 | Ht 69.5 in | Wt 129.4 lb

## 2023-11-01 DIAGNOSIS — R634 Abnormal weight loss: Secondary | ICD-10-CM | POA: Diagnosis not present

## 2023-11-01 DIAGNOSIS — K219 Gastro-esophageal reflux disease without esophagitis: Secondary | ICD-10-CM

## 2023-11-01 DIAGNOSIS — R1312 Dysphagia, oropharyngeal phase: Secondary | ICD-10-CM

## 2023-11-01 DIAGNOSIS — N3946 Mixed incontinence: Secondary | ICD-10-CM

## 2023-11-01 DIAGNOSIS — D329 Benign neoplasm of meninges, unspecified: Secondary | ICD-10-CM

## 2023-11-01 DIAGNOSIS — E785 Hyperlipidemia, unspecified: Secondary | ICD-10-CM | POA: Diagnosis not present

## 2023-11-01 DIAGNOSIS — I10 Essential (primary) hypertension: Secondary | ICD-10-CM

## 2023-11-01 NOTE — Progress Notes (Signed)
 [facility] of Service:   Clinic  Provider:   Code Status:  Goals of Care:     11/01/2023   10:50 AM  Advanced Directives  Does Patient Have a Medical Advance Directive? No  Would patient like information on creating a medical advance directive? No - Patient declined     Chief Complaint  Patient presents with   Medical Management of Chronic Issues    six weeks follow-up with labs.     HPI: Patient is a 89 y.o. male seen today for an acute visit for Follow up  [place]  Has h/o Urinary Incontinence Sees Dr Inga Manges PPM GERD, Dysphagia, h/o Meningioma, Recent Weight loss  Discussed the use of AI scribe software for clinical note transcription with the patient, who gave verbal consent to proceed.  History of Present Illness   Charles Olson is a 89 year old male with hypertension who presents for blood pressure management.  Hypertension Recently started on Norvasc  He monitors his blood pressure at home, with readings between 128/90 mmHg and 150/90 mmHg, none exceeding 160/90 mmHg. He experiences occasional dizziness, especially when standing quickly.  Weight loss He has unintentional weight loss, now weighing 129 pounds, down from 150-160 pounds. He eats more than desired but does not gain weight, and his appetite is decreased. He uses nutritional supplements like Ensure irregularly.  He has difficulty swallowing, managed by crushing medications and cutting food into small pieces. He has had his throat stretched three times, and his sons have similar issues. He feels food gets stuck in his throat.      Very active therwise Past Medical History:  Diagnosis Date   AVB (atrioventricular block)    HIGH DEGREE; s/p PPM implantation   BPH (benign prostatic hypertrophy)    Chest pain    Esophageal stricture    GERD (gastroesophageal reflux disease)    History of orthostatic hypotension    Hyperlipidemia    Pacemaker     Sinusitis    Syncope and collapse     Past Surgical History:  Procedure Laterality Date   CARDIAC CATHETERIZATION  06/2003   NORMAL CORONARIES   CHOLECYSTECTOMY     PACEMAKER GENERATOR CHANGE N/A 01/01/2013   Procedure: PACEMAKER GENERATOR CHANGE;  Surgeon: Jolly Needle, MD;  Location: [facility] CATH LAB;  Service: Cardiovascular;  Laterality: N/A;   PACEMAKER INSERTION  11/1998; 12/2012   Medtronic [id number] implanted by Dr Ardyce Bee in 2008; generator change to STJ Assurity 12/2012 by Dr Nunzio Belch   [facility] N/A 12/12/2022   Procedure: PPM GENERATOR CHANGEOUT;  Surgeon: Tammie Fall, MD;  Location: [facility] INVASIVE CV LAB;  Service: Cardiovascular;  Laterality: N/A;    Allergies  Allergen Reactions   Aspirin Anaphylaxis and Other (See Comments)    Swells severely internally.   Erythromycin     Sneezing - Tolerates Azithromycin    Outpatient Encounter Medications as of 11/01/2023  Medication Sig   amLODipine  (NORVASC ) 2.5 MG tablet Take 1 tablet (2.5 mg total) by mouth daily.   atorvastatin  (LIPITOR) 40 MG tablet Take 1 tablet by mouth once daily   Cholecalciferol (VITAMIN D) 1000 UNITS capsule Take 1,000 Units by mouth daily.    flunisolide (NASALIDE) 25 MCG/ACT (0.025%) SOLN Place 1-2 sprays into the nose daily as needed (Congestion).   Lysine 500 MG TABS Take 500 mg by mouth daily.    Multiple Vitamin (MULTIVITAMIN) tablet Take 1 tablet by  mouth daily. It's a chewable tablet   pantoprazole (PROTONIX) 40 MG tablet Take 40 mg by mouth daily.    No facility-administered encounter medications on file as of 11/01/2023.    Review of Systems:  Review of Systems  Constitutional:  Positive for unexpected weight change. Negative for activity change and appetite change.  HENT: Negative.    Respiratory:  Negative for cough and shortness of breath.   Cardiovascular:  Negative for leg swelling.  Gastrointestinal:  Negative for constipation.  Genitourinary:  Positive for frequency.   Musculoskeletal:  Negative for arthralgias, gait problem and myalgias.  Skin: Negative.  Negative for rash.  Neurological:  Negative for dizziness and weakness.  Psychiatric/Behavioral:  Negative for confusion and sleep disturbance.   All other systems reviewed and are negative.   Health Maintenance  Topic Date Due   DTaP/Tdap/Td (6 - Td or Tdap) 06/28/2023   COVID-19 Vaccine (4 - 2024-25 season) 12/22/2023 (Originally 01/22/2023)   INFLUENZA VACCINE  12/22/2023   Medicare Annual Wellness (AWV)  06/26/2024   Pneumonia Vaccine 44+ Years old  Completed   Zoster Vaccines- Shingrix  Completed   HPV VACCINES  Aged Out   Meningococcal B Vaccine  Aged Out    Physical Exam: Vitals:   11/01/23 0944  BP: (!) 150/70  Pulse: (!) 57  Resp: 18  Temp: (!) 97.3 F (36.3 C)  SpO2: 98%  Weight: 129 lb 6.4 oz (58.7 kg)  Height: 5' 9.5 (1.765 m)   Body mass index is 18.84 kg/m. Physical Exam Vitals reviewed.  Constitutional:      Appearance: Normal appearance.  HENT:     Head: Normocephalic.     Nose: Nose normal.     Mouth/Throat:     Mouth: Mucous membranes are moist.     Pharynx: Oropharynx is clear.   Eyes:     Pupils: Pupils are equal, round, and reactive to light.    Cardiovascular:     Rate and Rhythm: Normal rate and regular rhythm.     Pulses: Normal pulses.     Heart sounds: No murmur heard. Pulmonary:     Effort: Pulmonary effort is normal. No respiratory distress.     Breath sounds: Normal breath sounds. No rales.  Abdominal:     General: Abdomen is flat. Bowel sounds are normal.     Palpations: Abdomen is soft.   Musculoskeletal:        General: No swelling.     Cervical back: Neck supple.   Skin:    General: Skin is warm.   Neurological:     General: No focal deficit present.     Mental Status: He is alert and oriented to person, place, and time.   Psychiatric:        Mood and Affect: Mood normal.        Thought Content: Thought content normal.      Labs reviewed: Basic Metabolic Panel: Recent Labs    12/05/22 0959 10/30/23 0820  NA 141 142  K 3.7 4.0  CL 102 106  CO2 28 27  GLUCOSE 102* 86  BUN 17 19  CREATININE 1.34* 1.14  CALCIUM  9.6 9.2  TSH  --  1.28   Liver Function Tests: Recent Labs    10/30/23 0820  AST 25  ALT 16  BILITOT 0.8  PROT 6.4   No results for input(s): LIPASE, AMYLASE in the last 8760 hours. No results for input(s): AMMONIA in the last 8760 hours. CBC: Recent Labs  12/05/22 0959 10/30/23 0820  WBC 6.5 4.8  NEUTROABS  --  1,949  HGB 14.0 14.3  HCT 40.2 43.4  MCV 93 94.6  PLT 177 145   Lipid Panel: Recent Labs    10/30/23 0820  CHOL 131  HDL 53  LDLCALC 62  TRIG 84  CHOLHDL 2.5   No results found for: HGBA1C  Procedures since last visit: No results found.  Assessment/Plan 1. Essential hypertension, benign (Primary) BP reading good at home Will Continue Norvasc  2.5 mg dose Be careful when standing up Continue to monitor his BP 2. Weight loss From 140 to now 129 lbs Monitor weight over 8 weeks. - Order CT scan of chest and abdomen if further weight loss occurs. 3. Oropharyngeal dysphagia Has seen GI before many years ago Has to some times crush Meds to swallow On Protonix  4. Mixed stress and urge urinary incontinence Following With Dr Inga Manges  5. Dyslipidemia On statin  6. Benign neoplasm of meninges (HCC) Has h/o Meningioma Un changed in Many years per Patient   7. Gastroesophageal reflux disease without esophagitis Protonix 8 PACEMAKER, PERMANENT Has CHB PPM dependent Follows with Cardiology   Labs/tests ordered:  * No order type specified * Next appt:  12/27/2023

## 2023-12-11 ENCOUNTER — Ambulatory Visit: Payer: Medicare HMO

## 2023-12-11 DIAGNOSIS — R350 Frequency of micturition: Secondary | ICD-10-CM | POA: Diagnosis not present

## 2023-12-11 DIAGNOSIS — I442 Atrioventricular block, complete: Secondary | ICD-10-CM | POA: Diagnosis not present

## 2023-12-11 DIAGNOSIS — N200 Calculus of kidney: Secondary | ICD-10-CM | POA: Diagnosis not present

## 2023-12-11 DIAGNOSIS — N3000 Acute cystitis without hematuria: Secondary | ICD-10-CM | POA: Diagnosis not present

## 2023-12-11 DIAGNOSIS — N50811 Right testicular pain: Secondary | ICD-10-CM | POA: Diagnosis not present

## 2023-12-11 DIAGNOSIS — N401 Enlarged prostate with lower urinary tract symptoms: Secondary | ICD-10-CM | POA: Diagnosis not present

## 2023-12-12 LAB — CUP PACEART REMOTE DEVICE CHECK
Battery Remaining Longevity: 107 mo
Battery Remaining Percentage: 93 %
Battery Voltage: 3.01 V
Brady Statistic AP VP Percent: 34 %
Brady Statistic AP VS Percent: 1 %
Brady Statistic AS VP Percent: 66 %
Brady Statistic AS VS Percent: 1 %
Brady Statistic RA Percent Paced: 34 %
Brady Statistic RV Percent Paced: 99 %
Date Time Interrogation Session: 20250721020015
Implantable Lead Connection Status: 753985
Implantable Lead Connection Status: 753985
Implantable Lead Implant Date: 20000712
Implantable Lead Implant Date: 20000712
Implantable Lead Location: 753859
Implantable Lead Location: 753860
Implantable Lead Model: 4460
Implantable Lead Model: 4461
Implantable Lead Serial Number: 200213
Implantable Lead Serial Number: 20312
Implantable Pulse Generator Implant Date: 20240722
Lead Channel Impedance Value: 340 Ohm
Lead Channel Impedance Value: 440 Ohm
Lead Channel Pacing Threshold Amplitude: 0.75 V
Lead Channel Pacing Threshold Amplitude: 0.875 V
Lead Channel Pacing Threshold Pulse Width: 0.5 ms
Lead Channel Pacing Threshold Pulse Width: 0.5 ms
Lead Channel Sensing Intrinsic Amplitude: 3.1 mV
Lead Channel Setting Pacing Amplitude: 1.125
Lead Channel Setting Pacing Amplitude: 2 V
Lead Channel Setting Pacing Pulse Width: 0.5 ms
Lead Channel Setting Sensing Sensitivity: 4 mV
Pulse Gen Model: 2272
Pulse Gen Serial Number: 5819053

## 2023-12-12 NOTE — Progress Notes (Signed)
 A user error has taken place: orders placed in error, not carried out on this patient.

## 2023-12-13 ENCOUNTER — Ambulatory Visit: Payer: Self-pay | Admitting: Internal Medicine

## 2023-12-19 LAB — LAB REPORT - SCANNED
A1c: 5.4
EGFR: 51

## 2023-12-27 ENCOUNTER — Encounter: Admitting: Internal Medicine

## 2024-01-03 ENCOUNTER — Encounter: Payer: Self-pay | Admitting: Internal Medicine

## 2024-01-03 ENCOUNTER — Ambulatory Visit: Admitting: Internal Medicine

## 2024-01-03 VITALS — BP 134/70 | HR 62 | Temp 97.5°F | Ht 69.5 in | Wt 129.9 lb

## 2024-01-03 DIAGNOSIS — I10 Essential (primary) hypertension: Secondary | ICD-10-CM

## 2024-01-03 DIAGNOSIS — E785 Hyperlipidemia, unspecified: Secondary | ICD-10-CM | POA: Diagnosis not present

## 2024-01-03 DIAGNOSIS — R634 Abnormal weight loss: Secondary | ICD-10-CM | POA: Diagnosis not present

## 2024-01-03 DIAGNOSIS — R1312 Dysphagia, oropharyngeal phase: Secondary | ICD-10-CM

## 2024-01-03 DIAGNOSIS — N3946 Mixed incontinence: Secondary | ICD-10-CM

## 2024-01-03 NOTE — Patient Instructions (Signed)
 Please have your labs done here @ [facility]. On 06/24/24 @ 7:45

## 2024-01-03 NOTE — Progress Notes (Signed)
 Location: [facility] manager of Service:   Clinic  Provider:   Code Status:  Goals of Care:     11/01/2023   10:50 AM  Advanced Directives  Does Patient Have a Medical Advance Directive? No  Would patient like information on creating a medical advance directive? No - Patient declined     Chief Complaint  Patient presents with   Medical Management of Chronic Issues    Follow up. Patient states no issues today.     HPI: Patient is a 89 y.o. male seen today for an acute visit for BP follow up  [place]   Has h/o Urinary Incontinence Sees Dr Watt PPM GERD, Dysphagia, h/o Meningioma, Recent Weight loss Discussed the use of AI scribe software for clinical note transcription with the patient, who gave verbal consent to proceed.  History of Present Illness   Charles Olson is a 89 year old male who presents for a routine follow-up.  His blood pressure is well-managed with Norvasc , with a recent reading of 157 mmHg systolic and no associated dizziness. Most of the BP readings today were 120-140   Weight loss He has experienced a weight loss of 20 pounds over the past six months, now stable at 130 pounds, and is eating well to gain weight. He has chronic difficulty swallowing large pills, requiring his throat to be stretched three times, and continues to crush medications for easier swallowing.   He has a pacemaker, his fourth, implanted last year, with no current issues reported.   He is awaiting new hearing aids due to difficulty hearing in noisy environments.   He continues to drive and is monitored for glaucoma, having passed all tests so far.       Past Medical History:  Diagnosis Date   AVB (atrioventricular block)    HIGH DEGREE; s/p PPM implantation   BPH (benign prostatic hypertrophy)    Chest pain    Esophageal stricture    GERD (gastroesophageal reflux disease)    History of orthostatic hypotension    Hyperlipidemia     Pacemaker    Sinusitis    Syncope and collapse     Past Surgical History:  Procedure Laterality Date   CARDIAC CATHETERIZATION  06/2003   NORMAL CORONARIES   CHOLECYSTECTOMY     PACEMAKER GENERATOR CHANGE N/A 01/01/2013   Procedure: PACEMAKER GENERATOR CHANGE;  Surgeon: Lynwood Rakers, MD;  Location: [facility] CATH LAB;  Service: Cardiovascular;  Laterality: N/A;   PACEMAKER INSERTION  11/1998; 12/2012   Medtronic [id number] implanted by Dr Ellin in 2008; generator change to STJ Assurity 12/2012 by Dr Rakers   [facility] ISABELLA PAP N/A 12/12/2022   Procedure: PPM GENERATOR CHANGEOUT;  Surgeon: Waddell Danelle ORN, MD;  Location: [facility] INVASIVE CV LAB;  Service: Cardiovascular;  Laterality: N/A;    Allergies  Allergen Reactions   Aspirin Anaphylaxis and Other (See Comments)    Swells severely internally.   Erythromycin     Sneezing - Tolerates Azithromycin    Outpatient Encounter Medications as of 01/03/2024  Medication Sig   amLODipine  (NORVASC ) 2.5 MG tablet Take 1 tablet (2.5 mg total) by mouth daily.   atorvastatin  (LIPITOR) 40 MG tablet Take 1 tablet by mouth once daily   Cholecalciferol (VITAMIN D) 1000 UNITS capsule Take 1,000 Units by mouth daily.    flunisolide (NASALIDE) 25 MCG/ACT (0.025%) SOLN Place 1-2 sprays into the nose daily as needed (Congestion).  Lysine 500 MG TABS Take 500 mg by mouth daily.    Multiple Vitamin (MULTIVITAMIN) tablet Take 1 tablet by mouth daily. It's a chewable tablet   pantoprazole (PROTONIX) 40 MG tablet Take 40 mg by mouth daily.    No facility-administered encounter medications on file as of 01/03/2024.    Review of Systems:  Review of Systems  Constitutional:  Negative for activity change, appetite change and unexpected weight change.  HENT: Negative.    Respiratory:  Negative for cough and shortness of breath.   Cardiovascular:  Negative for leg swelling.  Gastrointestinal:  Negative for constipation.  Genitourinary:  Positive for frequency.   Musculoskeletal:  Negative for arthralgias, gait problem and myalgias.  Skin: Negative.  Negative for rash.  Neurological:  Negative for dizziness and weakness.  Psychiatric/Behavioral:  Negative for confusion and sleep disturbance.   All other systems reviewed and are negative.   Health Maintenance  Topic Date Due   COVID-19 Vaccine (4 - 2024-25 season) 01/19/2024 (Originally 01/22/2023)   INFLUENZA VACCINE  08/20/2024 (Originally 12/22/2023)   Medicare Annual Wellness (AWV)  06/26/2024   DTaP/Tdap/Td (7 - Td or Tdap) 12/18/2033   Pneumococcal Vaccine: 50+ Years  Completed   Zoster Vaccines- Shingrix  Completed   Hepatitis B Vaccines  Aged Out   HPV VACCINES  Aged Out   Meningococcal B Vaccine  Aged Out    Physical Exam: Vitals:   01/03/24 0849  BP: 134/70  Pulse: 62  Temp: (!) 97.5 F (36.4 C)  SpO2: 99%  Weight: 129 lb 14.4 oz (58.9 kg)  Height: 5' 9.5 (1.765 m)   Body mass index is 18.91 kg/m. Physical Exam Vitals reviewed.  Constitutional:      Appearance: Normal appearance.  HENT:     Head: Normocephalic.     Nose: Nose normal.     Mouth/Throat:     Mouth: Mucous membranes are moist.     Pharynx: Oropharynx is clear.  Eyes:     Pupils: Pupils are equal, round, and reactive to light.  Cardiovascular:     Rate and Rhythm: Normal rate and regular rhythm.     Pulses: Normal pulses.     Heart sounds: No murmur heard. Pulmonary:     Effort: Pulmonary effort is normal. No respiratory distress.     Breath sounds: Normal breath sounds. No rales.  Abdominal:     General: Abdomen is flat. Bowel sounds are normal.     Palpations: Abdomen is soft.  Musculoskeletal:        General: No swelling.     Cervical back: Neck supple.  Skin:    General: Skin is warm.  Neurological:     General: No focal deficit present.     Mental Status: He is alert and oriented to person, place, and time.  Psychiatric:        Mood and Affect: Mood normal.        Thought Content:  Thought content normal.     Labs reviewed: Basic Metabolic Panel: Recent Labs    10/30/23 0820  NA 142  K 4.0  CL 106  CO2 27  GLUCOSE 86  BUN 19  CREATININE 1.14  CALCIUM  9.2  TSH 1.28   Liver Function Tests: Recent Labs    10/30/23 0820  AST 25  ALT 16  BILITOT 0.8  PROT 6.4   No results for input(s): LIPASE, AMYLASE in the last 8760 hours. No results for input(s): AMMONIA in the last 8760 hours. CBC:  Recent Labs    10/30/23 0820  WBC 4.8  NEUTROABS 1,949  HGB 14.3  HCT 43.4  MCV 94.6  PLT 145   Lipid Panel: Recent Labs    10/30/23 0820  CHOL 131  HDL 53  LDLCALC 62  TRIG 84  CHOLHDL 2.5   No results found for: HGBA1C  Procedures since last visit: CUP PACEART REMOTE DEVICE CHECK Result Date: 12/12/2023 PPM scheduled remote reviewed. Normal device function.  Presenting rhythm: AP-VP Next remote 91 days. AB, CVRS   Assessment/Plan 1. Essential hypertension, benign (Primary) Norvasc  tolerating Low dose  2. Weight loss  - CT ABDOMEN PELVIS W CONTRAST; Future - CT Chest W Contrast; Future  3. Mixed stress and urge urinary incontinence Follows with Dr Brunetta  4. Dyslipidemia Statin  5. Oropharyngeal dysphagia On Protonix Crushes his meds    Labs/tests ordered:  * No order type specified * Next appt:  Visit date not found

## 2024-01-31 DIAGNOSIS — H40013 Open angle with borderline findings, low risk, bilateral: Secondary | ICD-10-CM | POA: Diagnosis not present

## 2024-02-16 ENCOUNTER — Ambulatory Visit (HOSPITAL_BASED_OUTPATIENT_CLINIC_OR_DEPARTMENT_OTHER)
Admission: RE | Admit: 2024-02-16 | Discharge: 2024-02-16 | Disposition: A | Discharge status: Home / Self Care | Source: Ambulatory Visit | Attending: Internal Medicine | Admitting: Internal Medicine

## 2024-02-16 DIAGNOSIS — N200 Calculus of kidney: Secondary | ICD-10-CM | POA: Diagnosis not present

## 2024-02-16 DIAGNOSIS — N400 Enlarged prostate without lower urinary tract symptoms: Secondary | ICD-10-CM | POA: Diagnosis not present

## 2024-02-16 DIAGNOSIS — R634 Abnormal weight loss: Secondary | ICD-10-CM | POA: Diagnosis not present

## 2024-02-16 DIAGNOSIS — I700 Atherosclerosis of aorta: Secondary | ICD-10-CM | POA: Diagnosis not present

## 2024-02-16 DIAGNOSIS — N281 Cyst of kidney, acquired: Secondary | ICD-10-CM | POA: Diagnosis not present

## 2024-02-16 LAB — POCT I-STAT CREATININE: Creatinine, Ser: 1.4 mg/dL — ABNORMAL HIGH (ref 0.61–1.24)

## 2024-02-16 MED ORDER — IOHEXOL 300 MG/ML  SOLN
100.0000 mL | Freq: Once | INTRAMUSCULAR | Status: AC | PRN
  Administered 2024-02-16: 100 mL via INTRAVENOUS

## 2024-02-19 ENCOUNTER — Ambulatory Visit: Payer: Self-pay | Admitting: Internal Medicine

## 2024-02-26 NOTE — Progress Notes (Signed)
 Remote PPM Transmission

## 2024-03-02 ENCOUNTER — Other Ambulatory Visit: Payer: Self-pay

## 2024-03-02 ENCOUNTER — Emergency Department (HOSPITAL_BASED_OUTPATIENT_CLINIC_OR_DEPARTMENT_OTHER)
Admission: EM | Admit: 2024-03-02 | Discharge: 2024-03-02 | Disposition: A | Discharge status: Home / Self Care | Source: Ambulatory Visit | Attending: Emergency Medicine | Admitting: Emergency Medicine

## 2024-03-02 ENCOUNTER — Encounter (HOSPITAL_BASED_OUTPATIENT_CLINIC_OR_DEPARTMENT_OTHER): Payer: Self-pay | Admitting: Emergency Medicine

## 2024-03-02 ENCOUNTER — Emergency Department (HOSPITAL_BASED_OUTPATIENT_CLINIC_OR_DEPARTMENT_OTHER)

## 2024-03-02 DIAGNOSIS — Z950 Presence of cardiac pacemaker: Secondary | ICD-10-CM | POA: Insufficient documentation

## 2024-03-02 DIAGNOSIS — N503 Cyst of epididymis: Secondary | ICD-10-CM | POA: Insufficient documentation

## 2024-03-02 DIAGNOSIS — R1909 Other intra-abdominal and pelvic swelling, mass and lump: Secondary | ICD-10-CM | POA: Diagnosis not present

## 2024-03-02 DIAGNOSIS — N50811 Right testicular pain: Secondary | ICD-10-CM | POA: Diagnosis not present

## 2024-03-02 DIAGNOSIS — K5900 Constipation, unspecified: Secondary | ICD-10-CM | POA: Insufficient documentation

## 2024-03-02 DIAGNOSIS — N453 Epididymo-orchitis: Secondary | ICD-10-CM | POA: Insufficient documentation

## 2024-03-02 DIAGNOSIS — N433 Hydrocele, unspecified: Secondary | ICD-10-CM | POA: Diagnosis not present

## 2024-03-02 LAB — BASIC METABOLIC PANEL WITH GFR
Anion gap: 11 (ref 5–15)
BUN: 16 mg/dL (ref 8–23)
CO2: 27 mmol/L (ref 22–32)
Calcium: 10 mg/dL (ref 8.9–10.3)
Chloride: 105 mmol/L (ref 98–111)
Creatinine, Ser: 1.23 mg/dL (ref 0.61–1.24)
GFR, Estimated: 55 mL/min — ABNORMAL LOW (ref >60)
Glucose, Bld: 97 mg/dL (ref 70–99)
Potassium: 4.6 mmol/L (ref 3.5–5.1)
Sodium: 142 mmol/L (ref 135–145)

## 2024-03-02 LAB — CBC WITH DIFFERENTIAL/PLATELET
Abs Immature Granulocytes: 0.02 K/uL (ref 0.00–0.07)
Basophils Absolute: 0 K/uL (ref 0.0–0.1)
Basophils Relative: 1 %
Eosinophils Absolute: 0.2 K/uL (ref 0.0–0.5)
Eosinophils Relative: 3 %
HCT: 39.5 % (ref 39.0–52.0)
Hemoglobin: 14 g/dL (ref 13.0–17.0)
Immature Granulocytes: 0 %
Lymphocytes Relative: 23 %
Lymphs Abs: 1.4 K/uL (ref 0.7–4.0)
MCH: 33.2 pg (ref 26.0–34.0)
MCHC: 35.4 g/dL (ref 30.0–36.0)
MCV: 93.6 fL (ref 80.0–100.0)
Monocytes Absolute: 0.5 K/uL (ref 0.1–1.0)
Monocytes Relative: 8 %
Neutro Abs: 3.8 K/uL (ref 1.7–7.7)
Neutrophils Relative %: 65 %
Platelets: 152 K/uL (ref 150–400)
RBC: 4.22 MIL/uL (ref 4.22–5.81)
RDW: 12.4 % (ref 11.5–15.5)
WBC: 5.8 K/uL (ref 4.0–10.5)
nRBC: 0 % (ref 0.0–0.2)

## 2024-03-02 LAB — URINALYSIS, ROUTINE W REFLEX MICROSCOPIC
Bilirubin Urine: NEGATIVE
Glucose, UA: NEGATIVE mg/dL
Hgb urine dipstick: NEGATIVE
Ketones, ur: NEGATIVE mg/dL
Leukocytes,Ua: NEGATIVE
Nitrite: NEGATIVE
Protein, ur: NEGATIVE mg/dL
Specific Gravity, Urine: <1.005 — ABNORMAL LOW (ref 1.005–1.030)
pH: 7 (ref 5.0–8.0)

## 2024-03-02 MED ORDER — CIPRO 500 MG/5ML (10%) PO SUSR: 500.0000 mg | Freq: Two times a day (BID) | ORAL | 0 refills | Status: AC

## 2024-03-02 MED ORDER — CIPROFLOXACIN HCL 500 MG PO TABS
500.0000 mg | ORAL_TABLET | Freq: Once | ORAL | Status: DC
  Filled 2024-03-02: qty 1

## 2024-03-02 NOTE — Discharge Instructions (Signed)
 Please read and follow all provided instructions.  Your diagnoses today include:  1. Epididymo-orchitis   2. Epididymal cyst    Tests performed today include: Blood cell counts and electrolytes: No concerning findings Urine test: No signs of infection Ultrasound of the scrotum showed swelling of the right testicle and epididymis as well as an epididymal cyst. Vital signs. See below for your results today.   Medications prescribed:  Ciprofloxacin - antibiotic  You have been prescribed an antibiotic medicine: take the entire course of medicine even if you are feeling better. Stopping early can cause the antibiotic not to work.  Take any prescribed medications only as directed.  Home care instructions:  Follow any educational materials contained in this packet.  Use over-the-counter Tylenol  for pain control, also consider scrotal support to help with the pain.  Follow-up instructions: Please follow-up with your urologist Dr. Watt as planned  Return instructions:  Please return to the Emergency Department if you experience worsening symptoms.  Return to the emergency department with fever or vomiting, worsening uncontrolled pain Please return if you have any other emergent concerns.  Additional Information:  Your vital signs today were: BP (!) 174/82   Pulse 71   Temp 97.9 F (36.6 C) (Oral)   Resp 18   SpO2 100%  If your blood pressure (BP) was elevated above 135/85 this visit, please have this repeated by your doctor within one month. --------------

## 2024-03-02 NOTE — ED Notes (Signed)
 Spoke with lab to Add on urine culture.

## 2024-03-02 NOTE — ED Triage Notes (Signed)
 Reports testicle pain since yesterday. Seen at [facility] and was told to get US . Has hx of testicle pain secondary to chronic UTI. Notes swelling.

## 2024-03-02 NOTE — ED Provider Notes (Signed)
 [place] EMERGENCY DEPARTMENT AT [facility] Provider Note   CSN: [id number] Arrival date & time: 03/02/24  1038     Patient presents with: Testicle Pain   Charles Olson is a 89 y.o. male.   Patient with history of cardiac pacemaker, kidney stones --presents to the emergency department today for evaluation of right groin swelling.  Patient has had this swelling for about 9 months.  States that he had a UTI then, and is due to follow-up with urology in about 10 days.  Patient has had increasing swelling and pain, prompting visit to the [facility] walk-in clinic today.  They referred him to the emergency department for an ultrasound.  He denies dysuria, increased frequency or urgency or hematuria.  No stool changes or blood in the stool.  Patient had a CT scan of the chest, abdomen, pelvis with contrast ordered by PCP about 2 weeks ago for unexplained weight loss.  This showed constipation, no comments on right groin swelling or signs of hernia.  No vomiting to suggest obstruction.  No mention of groin pain or swelling in PCP notes over the past several months.       Prior to Admission medications   Medication Sig Start Date End Date Taking? Authorizing Provider  amLODipine  (NORVASC ) 2.5 MG tablet Take 1 tablet (2.5 mg total) by mouth daily. 09/20/23   Charlanne Fredia CROME, MD  atorvastatin  (LIPITOR) 40 MG tablet Take 1 tablet by mouth once daily 05/08/23   Jeffrie Oneil BROCKS, MD  Cholecalciferol (VITAMIN D) 1000 UNITS capsule Take 1,000 Units by mouth daily.     [provider]  flunisolide (NASALIDE) 25 MCG/ACT (0.025%) SOLN Place 1-2 sprays into the nose daily as needed (Congestion). 09/06/12   [provider]  Lysine 500 MG TABS Take 500 mg by mouth daily.     [provider]  Multiple Vitamin (MULTIVITAMIN) tablet Take 1 tablet by mouth daily. It's a chewable tablet    [provider]  pantoprazole (PROTONIX) 40 MG tablet Take 40 mg by mouth daily.      [provider]    Allergies: Aspirin and Erythromycin    Review of Systems  Updated Vital Signs BP (!) 171/88 (BP Location: Right Arm)   Pulse 80   Temp 97.9 F (36.6 C) (Oral)   Resp 18   SpO2 99%   Physical Exam Vitals and nursing note reviewed.  Constitutional:      Appearance: He is well-developed.  HENT:     Head: Normocephalic and atraumatic.  Eyes:     Conjunctiva/sclera: Conjunctivae normal.  Pulmonary:     Effort: No respiratory distress.  Abdominal:     General: Abdomen is flat.     Tenderness: There is no abdominal tenderness. There is no guarding or rebound.  Genitourinary:      Comments: Swelling and tenderness, right groin area.  No significant swelling of the scrotum noted.  No overlying erythema or signs of cellulitis.  Patient winces and guards when I push over the swollen area in the right inguinal region.  Musculoskeletal:     Cervical back: Normal range of motion and neck supple.  Skin:    General: Skin is warm and dry.  Neurological:     Mental Status: He is alert.     (all labs ordered are listed, but only abnormal results are displayed) Labs Reviewed  BASIC METABOLIC PANEL WITH GFR - Abnormal; Notable for the following components:      Result  Value   GFR, Estimated 55 (*)    All other components within normal limits  URINALYSIS, ROUTINE W REFLEX MICROSCOPIC - Abnormal; Notable for the following components:   Color, Urine COLORLESS (*)    Specific Gravity, Urine <1.005 (*)    All other components within normal limits  URINE CULTURE  CBC WITH DIFFERENTIAL/PLATELET    EKG: None  Radiology: US  SCROTUM W/DOPPLER Result Date: 03/02/2024 CLINICAL DATA:  Chronic right-sided testicular pain. EXAM: SCROTAL ULTRASOUND DOPPLER ULTRASOUND OF THE TESTICLES TECHNIQUE: Complete ultrasound examination of the testicles, epididymis, and other scrotal structures was performed. Color and spectral Doppler ultrasound were also utilized to  evaluate blood flow to the testicles. COMPARISON:  None Available. FINDINGS: Right testicle Measurements: 3.2 cm x 2.0 cm x 2.5 cm. No mass or microlithiasis visualized. Diffusely increased flow is noted throughout the right testicle on color Doppler evaluation. Left testicle Measurements: 2.9 cm x 2.0 cm x 2.1 cm. No mass or microlithiasis visualized. Right epididymis: The right epididymis is heterogeneous in appearance and hypervascular on color Doppler evaluation. A 2.1 cm x 1.5 cm x 1.6 cm anechoic structure, containing a mild amount of echogenic debris, is also seen within the right epididymis. No abnormal flow is seen within this region on color Doppler evaluation. Left epididymis: A 2 mm cyst is seen within the head of the left epididymis. Hydrocele: A moderate sized septated right-sided hydrocele is seen. A small left-sided hydrocele is also noted. Varicocele:  None visualized. Pulsed Doppler interrogation of both testes demonstrates normal low resistance arterial and venous waveforms bilaterally. IMPRESSION: 1. Right-sided epididymo-orchitis. 2. Complex right-sided hydrocele which may represent sequelae related to a prior inflammatory and/or infectious process. 3. Findings likely consistent with a large right epididymal cyst, containing a mild amount of echogenic debris. Electronically Signed   By: Suzen Dials M.D.   On: 03/02/2024 11:49     Procedures   Medications Ordered in the ED - No data to display  ED Course  Patient seen and examined. History obtained directly from patient and wife.  I reviewed CT imaging performed 9/29 at bedside.  Area of swelling is at the very inferior portion of the CT cuts, and difficult to interpret.  Labs/EKG: Ordered CBC, BMP, UA.  Imaging: Ordered scrotal ultrasound with Doppler.  Medications/Fluids: None ordered  Most recent vital signs reviewed and are as follows: BP (!) 171/88 (BP Location: Right Arm)   Pulse 80   Temp 97.9 F (36.6 C) (Oral)    Resp 18   SpO2 99%   Initial impression: Right inguinal swelling, tenderness.  Discussed with Dr. Towana.   1:04 PM Reassessment performed. Patient appears stable, comfortable.  Labs personally reviewed and interpreted including: CBC with normal white blood cell count and hemoglobin; BMP unremarkable; UA without signs of infection.  Urine culture added.  Imaging results reviewed including: Scrotal ultrasound, shows epididymal orchitis with epididymal cyst with debris.  Reviewed pertinent lab work and imaging with patient at bedside. Questions answered.   Most current vital signs reviewed and are as follows: BP (!) 174/82   Pulse 71   Temp 97.9 F (36.6 C) (Oral)   Resp 18   SpO2 100%   Plan: Discharge to home.   Prescriptions written for: Ciprofloxacin (liquid ordered due to patient's difficulty with swallowing pills)  Other home care instructions discussed: Over-the-counter Tylenol  for pain, scrotal elevation and support  ED return instructions discussed: Worsening abdominal pain, vomiting, fever, uncontrolled pain.  Follow-up instructions discussed: Patient encouraged to  follow-up with their urologist as planned, he has an appointment scheduled for October 21.    Clinical Course as of 03/02/24 1303  Sat Mar 02, 2024  [id number] 89 year old male complaining of right testicle scrotum and groin pain going on since January.  Had been treated by Dr. Alexandria urology for an infection.  Symptoms seem to acutely worsen recently.  No difficulty urinating no fever.  Has tender right testicle on exam.  Getting ultrasound lab work. [MB]    Clinical Course User Index [MB] Towana Ozell BROCKS, MD                                 Medical Decision Making Amount and/or Complexity of Data Reviewed Labs: ordered. Radiology: ordered.  Risk Prescription drug management.   Patient with right sided scrotal pain and swelling, it sounds like he has had swelling for the better part of a year,  however has had worsening pain recently.  There was concern for a hernia, however this does not appear to be the case.  Patient has what appears to be epididymal orchitis with a epididymal cyst on ultrasound.  No signs of torsion.  Lab workup is reassuring.  Patient appears well nontoxic.  Pain is relatively controlled.  Will start on oral antibiotics for epididymitis/orchitis.  He has outpatient urology follow-up already planned.  The patient's vital signs, pertinent lab work and imaging were reviewed and interpreted as discussed in the ED course. Hospitalization was considered for further testing, treatments, or serial exams/observation. However as patient is well-appearing, has a stable exam, and reassuring studies today, I do not feel that they warrant admission at this time. This plan was discussed with the patient who verbalizes agreement and comfort with this plan and seems reliable and able to return to the Emergency Department with worsening or changing symptoms.       Final diagnoses:  Epididymo-orchitis  Epididymal cyst    ED Discharge Orders          Ordered    ciprofloxacin (CIPRO) 500 MG/5ML (10%) suspension  2 times daily        03/02/24 1302               Desiderio Chew, PA-C 03/02/24 1306    Towana Ozell BROCKS, MD 03/02/24 1705

## 2024-03-02 NOTE — ED Notes (Signed)

## 2024-03-03 LAB — URINE CULTURE: Culture: NO GROWTH

## 2024-03-11 ENCOUNTER — Ambulatory Visit: Payer: Medicare HMO

## 2024-03-11 DIAGNOSIS — I442 Atrioventricular block, complete: Secondary | ICD-10-CM

## 2024-03-12 LAB — CUP PACEART REMOTE DEVICE CHECK
Battery Remaining Longevity: 102 mo
Battery Remaining Percentage: 90 %
Battery Voltage: 3.01 V
Brady Statistic AP VP Percent: 35 %
Brady Statistic AP VS Percent: 1 %
Brady Statistic AS VP Percent: 65 %
Brady Statistic AS VS Percent: 1 %
Brady Statistic RA Percent Paced: 35 %
Brady Statistic RV Percent Paced: 99 %
Date Time Interrogation Session: 20251020030842
Implantable Lead Connection Status: 753985
Implantable Lead Connection Status: 753985
Implantable Lead Implant Date: 20000712
Implantable Lead Implant Date: 20000712
Implantable Lead Location: 753859
Implantable Lead Location: 753860
Implantable Lead Model: 4460
Implantable Lead Model: 4461
Implantable Lead Serial Number: 200213
Implantable Lead Serial Number: 20312
Implantable Pulse Generator Implant Date: 20240722
Lead Channel Impedance Value: 340 Ohm
Lead Channel Impedance Value: 390 Ohm
Lead Channel Pacing Threshold Amplitude: 0.75 V
Lead Channel Pacing Threshold Amplitude: 1 V
Lead Channel Pacing Threshold Pulse Width: 0.5 ms
Lead Channel Pacing Threshold Pulse Width: 0.5 ms
Lead Channel Sensing Intrinsic Amplitude: 11.4 mV
Lead Channel Sensing Intrinsic Amplitude: 2.6 mV
Lead Channel Setting Pacing Amplitude: 1.25 V
Lead Channel Setting Pacing Amplitude: 2 V
Lead Channel Setting Pacing Pulse Width: 0.5 ms
Lead Channel Setting Sensing Sensitivity: 4 mV
Pulse Gen Model: 2272
Pulse Gen Serial Number: 5819053

## 2024-03-13 ENCOUNTER — Ambulatory Visit: Payer: Self-pay | Admitting: Internal Medicine

## 2024-03-13 DIAGNOSIS — N431 Infected hydrocele: Secondary | ICD-10-CM | POA: Diagnosis not present

## 2024-03-13 DIAGNOSIS — N3020 Other chronic cystitis without hematuria: Secondary | ICD-10-CM | POA: Diagnosis not present

## 2024-03-13 DIAGNOSIS — N453 Epididymo-orchitis: Secondary | ICD-10-CM | POA: Diagnosis not present

## 2024-03-15 NOTE — Progress Notes (Signed)
 Remote PPM Transmission

## 2024-03-18 ENCOUNTER — Other Ambulatory Visit: Payer: Self-pay | Admitting: Internal Medicine

## 2024-04-08 DIAGNOSIS — R3129 Other microscopic hematuria: Secondary | ICD-10-CM | POA: Diagnosis not present

## 2024-04-08 DIAGNOSIS — N390 Urinary tract infection, site not specified: Secondary | ICD-10-CM | POA: Diagnosis not present

## 2024-06-05 ENCOUNTER — Encounter: Payer: Self-pay | Admitting: Internal Medicine

## 2024-06-05 ENCOUNTER — Non-Acute Institutional Stay: Admitting: Internal Medicine

## 2024-06-05 VITALS — BP 134/76 | HR 99 | Temp 98.3°F | Resp 18 | Ht 69.5 in | Wt 123.7 lb

## 2024-06-05 DIAGNOSIS — J069 Acute upper respiratory infection, unspecified: Secondary | ICD-10-CM

## 2024-06-05 DIAGNOSIS — R634 Abnormal weight loss: Secondary | ICD-10-CM | POA: Diagnosis not present

## 2024-06-05 MED ORDER — MIRTAZAPINE 7.5 MG PO TABS: 7.5000 mg | ORAL_TABLET | Freq: Every day | ORAL | 1 refills | Status: AC

## 2024-06-05 NOTE — Progress Notes (Signed)
 "  Location: [facility] Service:  Clinic (12)  Provider:   Code Status:  Goals of Care:     03/02/2024   10:47 AM  Advanced Directives  Does Patient Have a Medical Advance Directive? No  Would patient like information on creating a medical advance directive? No - Patient declined     Chief Complaint  Patient presents with   Cough    Cough and chest congestion     HPI: Patient is a 89 y.o. male seen today for an acute visit for Cough Congestion and weight loss  [place] with [place]   Has h/o Urinary Incontinence and Epididymitis Sees Dr Watt PPM GERD, Dysphagia, h/o Meningioma, Recent Weight loss  Discussed the use of AI scribe software for clinical note transcription with the patient, who gave verbal consent to proceed.  History of Present Illness   Charles Olson is a 89 year old male who presents with cough and weight loss. Cough and Congestion He developed chest and throat congestion starting Sunday with drainage and green sputum and associated chest pain when coughing. He has had no fever and symptoms have been improving.He did take 2 benadryl which helped  Weight loss He has lost weight from about 150 pounds to 123 pounds over several years with low appetite and rarely feeling hungry. A CT scan a couple of months ago was done to evaluate this. He is eating less than before. He remains active, including playing pickleball when able, and reports good sleep.  Dysphagia He has chronic difficulty swallowing from a narrow esophagus that has been dilated three times. He uses a pill crusher for medications. He can eat solid foods such as steak if he chews thoroughly.  Urinary Issues He has had several urinary tract infections and a testicular cyst under specialist monitoring. He noted blood in his urine a couple of weeks ago, and culture was negative for infection. He is awaiting further bladder evaluation.       Past Medical History:   Diagnosis Date   AVB (atrioventricular block)    HIGH DEGREE; s/p PPM implantation   BPH (benign prostatic hypertrophy)    Chest pain    Esophageal stricture    GERD (gastroesophageal reflux disease)    History of orthostatic hypotension    Hyperlipidemia    Pacemaker    Sinusitis    Syncope and collapse     Past Surgical History:  Procedure Laterality Date   CARDIAC CATHETERIZATION  06/2003   NORMAL CORONARIES   CHOLECYSTECTOMY     PACEMAKER GENERATOR CHANGE N/A 01/01/2013   Procedure: PACEMAKER GENERATOR CHANGE;  Surgeon: Lynwood Rakers, MD;  Location: [facility] CATH LAB;  Service: Cardiovascular;  Laterality: N/A;   PACEMAKER INSERTION  11/1998; 12/2012   Medtronic [id number] implanted by Dr Ellin in 2008; generator change to STJ [employer]urity 12/2012 by Dr Rakers   [facility] ISABELLA PAP N/A 12/12/2022   Procedure: PPM GENERATOR CHANGEOUT;  Surgeon: Waddell Danelle ORN, MD;  Location: [facility] INVASIVE CV LAB;  Service: Cardiovascular;  Laterality: N/A;    Allergies[1]  Outpatient Encounter Medications as of 06/05/2024  Medication Sig   amLODipine  (NORVASC ) 2.5 MG tablet Take 1 tablet by mouth once daily   atorvastatin  (LIPITOR) 40 MG tablet Take 1 tablet by mouth once daily   Cholecalciferol (VITAMIN D) 1000 UNITS capsule Take 1,000 Units by mouth daily.    flunisolide (NASALIDE) 25 MCG/ACT (0.025%) SOLN Place 1-2 sprays into the  nose daily as needed (Congestion).   Lysine 500 MG TABS Take 500 mg by mouth daily.    mirtazapine  (REMERON ) 7.5 MG tablet Take 1 tablet (7.5 mg total) by mouth at bedtime.   Multiple Vitamin (MULTIVITAMIN) tablet Take 1 tablet by mouth daily. It's a chewable tablet   pantoprazole (PROTONIX) 40 MG tablet Take 40 mg by mouth daily.    No facility-administered encounter medications on file as of 06/05/2024.    Review of Systems:  Review of Systems  Constitutional:  Positive for unexpected weight change. Negative for activity change and appetite change.  HENT: Negative.     Respiratory:  Positive for cough. Negative for shortness of breath.   Cardiovascular:  Negative for leg swelling.  Gastrointestinal:  Negative for constipation.  Genitourinary:  Positive for frequency and hematuria.  Musculoskeletal:  Negative for arthralgias, gait problem and myalgias.  Skin: Negative.  Negative for rash.  Neurological:  Negative for dizziness and weakness.  Psychiatric/Behavioral:  Negative for confusion and sleep disturbance.   All other systems reviewed and are negative.   Health Maintenance  Topic Date Due   COVID-19 Vaccine (4 - 2025-26 season) 01/22/2024   Medicare Annual Wellness (AWV)  06/26/2024   DTaP/Tdap/Td (7 - Td or Tdap) 12/18/2033   Pneumococcal Vaccine: 50+ Years  Completed   Influenza Vaccine  Completed   Zoster Vaccines- Shingrix  Completed   Meningococcal B Vaccine  Aged Out    Physical Exam: Vitals:   06/05/24 1201  BP: 134/76  Pulse: 99  Resp: 18  Temp: 98.3 F (36.8 C)  SpO2: 93%  Weight: 123 lb 11.2 oz (56.1 kg)  Height: 5' 9.5 (1.765 m)   Body mass index is 18.01 kg/m. Physical Exam Vitals reviewed.  Constitutional:      Appearance: Normal appearance.  HENT:     Head: Normocephalic.     Nose: Nose normal.     Mouth/Throat:     Mouth: Mucous membranes are moist.     Pharynx: Oropharynx is clear.  Eyes:     Pupils: Pupils are equal, round, and reactive to light.  Cardiovascular:     Rate and Rhythm: Normal rate and regular rhythm.     Pulses: Normal pulses.     Heart sounds: No murmur heard. Pulmonary:     Effort: Pulmonary effort is normal. No respiratory distress.     Breath sounds: Normal breath sounds. No rales.  Abdominal:     General: Abdomen is flat. Bowel sounds are normal.     Palpations: Abdomen is soft.  Musculoskeletal:        General: No swelling.     Cervical back: Neck supple.  Skin:    General: Skin is warm.  Neurological:     General: No focal deficit present.     Mental Status: He is alert  and oriented to person, place, and time.  Psychiatric:        Mood and Affect: Mood normal.        Thought Content: Thought content normal.     Labs reviewed: Basic Metabolic Panel: Recent Labs    10/30/23 0820 02/16/24 1105 03/02/24 1142  NA 142  --  142  K 4.0  --  4.6  CL 106  --  105  CO2 27  --  27  GLUCOSE 86  --  97  BUN 19  --  16  CREATININE 1.14 1.40* 1.23  CALCIUM  9.2  --  10.0  TSH 1.28  --   --  Liver Function Tests: Recent Labs    10/30/23 0820  AST 25  ALT 16  BILITOT 0.8  PROT 6.4   No results for input(s): LIPASE, AMYLASE in the last 8760 hours. No results for input(s): AMMONIA in the last 8760 hours. CBC: Recent Labs    10/30/23 0820 03/02/24 1142  WBC 4.8 5.8  NEUTROABS 1,949 3.8  HGB 14.3 14.0  HCT 43.4 39.5  MCV 94.6 93.6  PLT 145 152   Lipid Panel: Recent Labs    10/30/23 0820  CHOL 131  HDL 53  LDLCALC 62  TRIG 84  CHOLHDL 2.5   No results found for: HGBA1C  Procedures since last visit: No results found.  Assessment/Plan     Acute upper respiratory infection Symptoms improving, indicating recovery without antibiotics. - Monitor symptoms for a few more days.  Unintentional weight loss with anorexia Significant weight loss with decreased appetite. CT scan normal, ruling out malignancy. Discussed appetite stimulant use and potential side effects. Remeron  7.5 mg QHSat 9:30 PM, one hour before bedtime. - Advised to start medication in one week post-infection resolution. - Instructed to monitor for excessive sleepiness; skip dose if present and reassess. - Encouraged daily Boost or Ensure intake between meals. Extra Calorie intake - Scheduled follow-up in four weeks to assess weight and medication tolerance.  Dysphagia due to esophageal stricture Esophageal stricture with previous dilations. Difficulty swallowing pills, no acute issues. Possible need for further evaluation if weight loss continues. - Prescribed  liquid form of appetite stimulant or instructed to crush tablets. - Consider gastroenterologist referral if weight loss persists.          Labs/tests ordered:  * No order type specified * Next appt:  07/03/2024     [1]  Allergies Allergen Reactions   Aspirin Anaphylaxis and Other (See Comments)    Swells severely internally.   Erythromycin     Sneezing - Tolerates Azithromycin   "

## 2024-06-06 ENCOUNTER — Other Ambulatory Visit: Payer: Self-pay | Admitting: Cardiology

## 2024-06-07 NOTE — Telephone Encounter (Signed)
 Lipid Panel Completed on 11/09/23

## 2024-06-10 ENCOUNTER — Ambulatory Visit: Payer: Medicare HMO

## 2024-06-10 DIAGNOSIS — I442 Atrioventricular block, complete: Secondary | ICD-10-CM

## 2024-06-11 LAB — CUP PACEART REMOTE DEVICE CHECK
Battery Remaining Longevity: 101 mo
Battery Remaining Percentage: 88 %
Battery Voltage: 3.01 V
Brady Statistic AP VP Percent: 35 %
Brady Statistic AP VS Percent: 1 %
Brady Statistic AS VP Percent: 65 %
Brady Statistic AS VS Percent: 1 %
Brady Statistic RA Percent Paced: 35 %
Brady Statistic RV Percent Paced: 99 %
Date Time Interrogation Session: 20260119025232
Implantable Lead Connection Status: 753985
Implantable Lead Connection Status: 753985
Implantable Lead Implant Date: 20000712
Implantable Lead Implant Date: 20000712
Implantable Lead Location: 753859
Implantable Lead Location: 753860
Implantable Lead Model: 4460
Implantable Lead Model: 4461
Implantable Lead Serial Number: 200213
Implantable Lead Serial Number: 20312
Implantable Pulse Generator Implant Date: 20240722
Lead Channel Impedance Value: 330 Ohm
Lead Channel Impedance Value: 380 Ohm
Lead Channel Pacing Threshold Amplitude: 0.75 V
Lead Channel Pacing Threshold Amplitude: 0.75 V
Lead Channel Pacing Threshold Pulse Width: 0.5 ms
Lead Channel Pacing Threshold Pulse Width: 0.5 ms
Lead Channel Sensing Intrinsic Amplitude: 11.4 mV
Lead Channel Sensing Intrinsic Amplitude: 2.7 mV
Lead Channel Setting Pacing Amplitude: 1 V
Lead Channel Setting Pacing Amplitude: 2 V
Lead Channel Setting Pacing Pulse Width: 0.5 ms
Lead Channel Setting Sensing Sensitivity: 4 mV
Pulse Gen Model: 2272
Pulse Gen Serial Number: 5819053

## 2024-06-14 NOTE — Progress Notes (Signed)
 Remote PPM Transmission

## 2024-06-16 ENCOUNTER — Ambulatory Visit: Payer: Self-pay | Admitting: Cardiology

## 2024-06-18 ENCOUNTER — Ambulatory Visit: Payer: Self-pay

## 2024-06-18 NOTE — Telephone Encounter (Signed)
 FYI Only or Action Required?: FYI only for provider: appointment scheduled on 1/28.  Patient was last seen in primary care on 06/05/2024 by Charlanne Fredia CROME, MD.  Called Nurse Triage reporting Testicle Pain.  Symptoms began several days ago.  Interventions attempted: Rest, hydration, or home remedies.  Symptoms are: gradually worsening.  Triage Disposition: See Physician Within 24 Hours  Patient/caregiver understands and will follow disposition?: Yes  Message from [employer] sent at 06/18/2024  9:47 AM EST  Reason for Triage: patient has severe scrotum pain     Reason for Disposition  [1] MILD to MODERATE pain AND [2] comes and goes (intermittent; brief episodes less than one hour long) AND [3] present > 24 hours  Answer Assessment - Initial Assessment Questions History of epidymal cyst and frequent UTIs. Ongoing issue with Urology. Unable to get into Urology at this time- having ongoing discussions if surgery is needed.   Sunday night dull aching scrotal pain started on the right- consistent with ongoing issue. Denies UTI sx, abdominal pain, bloody urine.   Appt with PCP at [facility] to discuss management as they have not brought Dr Charlanne in on discussion. Understands ED for any changes in pain.   1. LOCATION and RADIATION: Where is the pain located?      Right testicle  2. QUALITY: What does the pain feel like?  (e.g., sharp, dull, aching, burning)     Dull aching pain  3. SEVERITY: How bad is the pain?  (Scale 1-10; or mild, moderate, severe)     Dull 8/10  4. ONSET: When did the pain start?     Sunday  5. PATTERN: Does it come and go, or has it been constant since it started?     Constant  6. SCROTAL APPEARANCE: What does the scrotum look like? Is there any swelling or redness?      Denies redness , maybe mild swelling  7. HERNIA: Has a doctor ever told you that you have a hernia?     Denies  8. OTHER SYMPTOMS: Do you have any other symptoms? (e.g.,  abdomen pain, difficulty passing urine, fever, vomiting)     Denies UTI sx  Protocols used: Scrotum Pain-A-AH

## 2024-06-19 ENCOUNTER — Encounter: Payer: Self-pay | Admitting: Internal Medicine

## 2024-06-19 ENCOUNTER — Non-Acute Institutional Stay: Admitting: Internal Medicine

## 2024-06-19 VITALS — BP 134/68 | HR 82 | Temp 98.7°F | Resp 18 | Ht 69.5 in | Wt 129.9 lb

## 2024-06-19 DIAGNOSIS — N451 Epididymitis: Secondary | ICD-10-CM

## 2024-06-19 DIAGNOSIS — R319 Hematuria, unspecified: Secondary | ICD-10-CM

## 2024-06-19 MED ORDER — CIPROFLOXACIN HCL 500 MG PO TABS: 500.0000 mg | ORAL_TABLET | Freq: Two times a day (BID) | ORAL | 0 refills | Status: AC

## 2024-06-19 NOTE — Progress Notes (Signed)
 "  Location: [facility] Service:  Clinic (12)  Provider:   Code Status:  Goals of Care:     06/19/2024    8:59 AM  Advanced Directives  Does Patient Have a Medical Advance Directive? No  Would patient like information on creating a medical advance directive? No - Patient declined     Chief Complaint  Patient presents with   Testicle Pain    Sunday night dull aching scrotal pain started on the right- consistent with ongoing issue. Denies UTI sx, abdominal pain, bloody urine.     HPI: Patient is a 89 y.o. male seen today for an acute visit for Pain in right scrotal area with hematuria   Patient has h/o recurent Epididymitis.  He has seen Dr. Watt Urologist before He had suggested the patient would need surgery with removal of the cyst Patient also had hematuria.  Had cystoscopy done last week.  He was told that his bladder looked fine.  He started to noticing symptoms pain in his right scrotal area on Sunday with worsening next few days.  He also started having hematuria. He could not get through the urology office came to see me today.  Did not have any fever or chills nausea or vomiting.  Past Medical History:  Diagnosis Date   AVB (atrioventricular block)    HIGH DEGREE; s/p PPM implantation   BPH (benign prostatic hypertrophy)    Chest pain    Esophageal stricture    GERD (gastroesophageal reflux disease)    History of orthostatic hypotension    Hyperlipidemia    Pacemaker    Sinusitis    Syncope and collapse     Past Surgical History:  Procedure Laterality Date   CARDIAC CATHETERIZATION  06/2003   NORMAL CORONARIES   CHOLECYSTECTOMY     PACEMAKER GENERATOR CHANGE N/A 01/01/2013   Procedure: PACEMAKER GENERATOR CHANGE;  Surgeon: Lynwood Rakers, MD;  Location: [facility] CATH LAB;  Service: Cardiovascular;  Laterality: N/A;   PACEMAKER INSERTION  11/1998; 12/2012   Medtronic [id number] implanted by Dr Ellin in 2008; generator change to STJ [employer]urity 12/2012 by  Dr Rakers   [facility] ISABELLA PAP N/A 12/12/2022   Procedure: PPM GENERATOR CHANGEOUT;  Surgeon: Waddell Danelle ORN, MD;  [facility] INVAS[facility] CV LAB;  Service: Cardiovascular;  Laterality: N/A;    Allergies[1]  Outpatient Encounter Medications as of 06/19/2024  Medication Sig   amLODipine  (NORVASC ) 2.5 MG tablet Take 1 tablet by mouth once daily   atorvastatin  (LIPITOR) 40 MG tablet Take 1 tablet by mouth once daily   Cholecalciferol (VITAMIN D) 1000 UNITS capsule Take 1,000 Units by mouth daily.    ciprofloxacin  (CIPRO ) 500 MG tablet Take 1 tablet (500 mg total) by mouth 2 (two) times daily for 10 days.   flunisolide (NASALIDE) 25 MCG/ACT (0.025%) SOLN Place 1-2 sprays into the nose daily as needed (Congestion).   Lysine 500 MG TABS Take 500 mg by mouth daily.    mirtazapine  (REMERON ) 7.5 MG tablet Take 1 tablet (7.5 mg total) by mouth at bedtime.   Multiple Vitamin (MULTIVITAMIN) tablet Take 1 tablet by mouth daily. It's a chewable tablet   pantoprazole (PROTONIX) 40 MG tablet Take 40 mg by mouth daily.    No facility-administered encounter medications on file as of 06/19/2024.    Review of Systems:  Review of Systems  Constitutional:  Negative for activity change, appetite change and unexpected weight change.  HENT: Negative.    Respiratory:  Negative for cough and shortness of breath.   Cardiovascular:  Negative for leg swelling.  Gastrointestinal:  Negative for constipation.  Genitourinary:  Positive for hematuria, scrotal swelling and testicular pain. Negative for frequency.  Musculoskeletal:  Negative for arthralgias, gait problem and myalgias.  Skin: Negative.  Negative for rash.  Neurological:  Negative for dizziness and weakness.  Psychiatric/Behavioral:  Negative for confusion and sleep disturbance.   All other systems reviewed and are negative.   Health Maintenance  Topic Date Due   COVID-19 Vaccine (4 - 2025-26 season) 01/22/2024   Medicare Annual Wellness (AWV)   06/26/2024   DTaP/Tdap/Td (7 - Td or Tdap) 12/18/2033   Pneumococcal Vaccine: 50+ Years  Completed   Influenza Vaccine  Completed   Zoster Vaccines- Shingrix  Completed   Meningococcal B Vaccine  Aged Out    Physical Exam: Vitals:   06/19/24 0857  BP: 134/68  Pulse: 82  Resp: 18  Temp: 98.7 F (37.1 C)  SpO2: 96%  Weight: 129 lb 14.4 oz (58.9 kg)  Height: 5' 9.5 (1.765 m)   Body mass index is 18.91 kg/m. Physical Exam Vitals reviewed.  Constitutional:      Appearance: Normal appearance.  HENT:     Head: Normocephalic.     Nose: Nose normal.     Mouth/Throat:     Mouth: Mucous membranes are moist.     Pharynx: Oropharynx is clear.  Eyes:     Pupils: Pupils are equal, round, and reactive to light.  Cardiovascular:     Rate and Rhythm: Normal rate and regular rhythm.     Pulses: Normal pulses.     Heart sounds: No murmur heard. Pulmonary:     Effort: Pulmonary effort is normal. No respiratory distress.     Breath sounds: Normal breath sounds. No rales.  Abdominal:     General: Abdomen is flat. Bowel sounds are normal.     Palpations: Abdomen is soft.  Genitourinary:    Comments: Swelling and tender in right scrotal area going to the top of the lower abdominal area Musculoskeletal:        General: No swelling.     Cervical back: Neck supple.  Skin:    General: Skin is warm.  Neurological:     General: No focal deficit present.     Mental Status: He is alert and oriented to person, place, and time.  Psychiatric:        Mood and Affect: Mood normal.        Thought Content: Thought content normal.     Labs reviewed: Basic Metabolic Panel: Recent Labs    10/30/23 0820 02/16/24 1105 03/02/24 1142  NA 142  --  142  K 4.0  --  4.6  CL 106  --  105  CO2 27  --  27  GLUCOSE 86  --  97  BUN 19  --  16  CREATININE 1.14 1.40* 1.23  CALCIUM  9.2  --  10.0  TSH 1.28  --   --    Liver Function Tests: Recent Labs    10/30/23 0820  AST 25  ALT 16  BILITOT  0.8  PROT 6.4   No results for input(s): LIPASE, AMYLASE in the last 8760 hours. No results for input(s): AMMONIA in the last 8760 hours. CBC: Recent Labs    10/30/23 0820 03/02/24 1142  WBC 4.8 5.8  NEUTROABS 1,949 3.8  HGB 14.3 14.0  HCT 43.4 39.5  MCV 94.6 93.6  PLT  145 152   Lipid Panel: Recent Labs    10/30/23 0820  CHOL 131  HDL 53  LDLCALC 62  TRIG 84  CHOLHDL 2.5   No results found for: HGBA1C  Procedures since last visit: CUP PACEART REMOTE DEVICE CHECK Result Date: 06/11/2024 PPM Scheduled remote reviewed. Normal device function.  Presenting rhythm:  AP-VP. Next remote transmission per protocol. - CS, CVRS   Assessment/Plan 1. Epididymitis (Primary) Cipro  BID for 10 days Go to ED if Fever chills or any other worsening of symptoms Call Dr Maynard office to make appointment He is aware of his Symptoms.    2. Hematuria, unspecified type AS above Patient did have Cystoscopy done recently    Labs/tests ordered:   Next appt:  07/03/2024 Total time spent in this patient care encounter was 45 _  minutes; greater than 50% of the visit spent counseling patient and staff, reviewing records , Labs and coordinating care for problems addressed at this encounter.       [1]  Allergies Allergen Reactions   Aspirin Anaphylaxis and Other (See Comments)    Swells severely internally.   Erythromycin     Sneezing - Tolerates Azithromycin   "

## 2024-06-25 ENCOUNTER — Other Ambulatory Visit: Payer: Self-pay | Admitting: Urology

## 2024-06-25 LAB — CBC WITH DIFFERENTIAL/PLATELET
Absolute Lymphocytes: 1803 {cells}/uL (ref 850–3900)
Absolute Monocytes: 419 {cells}/uL (ref 200–950)
Basophils Absolute: 18 {cells}/uL (ref 0–200)
Basophils Relative: 0.4 %
Eosinophils Absolute: 368 {cells}/uL (ref 15–500)
Eosinophils Relative: 8 %
HCT: 41.4 % (ref 39.4–51.1)
Hemoglobin: 13.9 g/dL (ref 13.2–17.1)
MCH: 32.2 pg (ref 27.0–33.0)
MCHC: 33.6 g/dL (ref 31.6–35.4)
MCV: 95.8 fL (ref 81.4–101.7)
MPV: 10 fL (ref 7.5–12.5)
Monocytes Relative: 9.1 %
Neutro Abs: 1992 {cells}/uL (ref 1500–7800)
Neutrophils Relative %: 43.3 %
Platelets: 185 10*3/uL (ref 140–400)
RBC: 4.32 Million/uL (ref 4.20–5.80)
RDW: 12.8 % (ref 11.0–15.0)
Total Lymphocyte: 39.2 %
WBC: 4.6 10*3/uL (ref 3.8–10.8)

## 2024-06-25 LAB — COMPLETE METABOLIC PANEL WITHOUT GFR
AG Ratio: 1.6 (calc) (ref 1.0–2.5)
ALT: 18 U/L (ref 9–46)
AST: 25 U/L (ref 10–35)
Albumin: 4.1 g/dL (ref 3.6–5.1)
Alkaline phosphatase (APISO): 81 U/L (ref 35–144)
BUN/Creatinine Ratio: 18 (calc) (ref 6–22)
BUN: 24 mg/dL (ref 7–25)
CO2: 32 mmol/L (ref 20–32)
Calcium: 9.4 mg/dL (ref 8.6–10.3)
Chloride: 103 mmol/L (ref 98–110)
Creat: 1.3 mg/dL — ABNORMAL HIGH (ref 0.70–1.22)
Globulin: 2.6 g/dL (ref 1.9–3.7)
Glucose, Bld: 84 mg/dL (ref 65–99)
Potassium: 4.5 mmol/L (ref 3.5–5.3)
Sodium: 142 mmol/L (ref 135–146)
Total Bilirubin: 0.8 mg/dL (ref 0.2–1.2)
Total Protein: 6.7 g/dL (ref 6.1–8.1)

## 2024-06-25 LAB — LIPID PANEL
Cholesterol: 127 mg/dL
HDL: 53 mg/dL
LDL Cholesterol (Calc): 58 mg/dL
Non-HDL Cholesterol (Calc): 74 mg/dL
Total CHOL/HDL Ratio: 2.4 (calc)
Triglycerides: 77 mg/dL

## 2024-06-25 LAB — TSH: TSH: 1.52 m[IU]/L (ref 0.40–4.50)

## 2024-07-03 ENCOUNTER — Encounter: Payer: Self-pay | Admitting: Internal Medicine

## 2024-08-02 ENCOUNTER — Ambulatory Visit (HOSPITAL_COMMUNITY): Admit: 2024-08-02 | Admitting: Urology
